# Patient Record
Sex: Male | Born: 1965
Health system: Southern US, Community
[De-identification: ages and names within clinical notes are randomized; demographics above are authoritative.]

## PROBLEM LIST (undated history)

## (undated) DIAGNOSIS — M109 Gout, unspecified: Secondary | ICD-10-CM

## (undated) DIAGNOSIS — M1990 Unspecified osteoarthritis, unspecified site: Secondary | ICD-10-CM

## (undated) DIAGNOSIS — J309 Allergic rhinitis, unspecified: Secondary | ICD-10-CM

## (undated) DIAGNOSIS — N452 Orchitis: Secondary | ICD-10-CM

## (undated) HISTORY — DX: Orchitis: N45.2

## (undated) HISTORY — PX: FRACTURE SURGERY: SHX138

## (undated) HISTORY — DX: Gout, unspecified: M10.9

## (undated) HISTORY — DX: Allergic rhinitis, unspecified: J30.9

---
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
## 1985-01-02 HISTORY — PX: NASAL SEPTUM SURGERY: SHX37

## 2014-05-19 ENCOUNTER — Ambulatory Visit: Payer: Self-pay | Admitting: Primary Care

## 2014-05-25 ENCOUNTER — Ambulatory Visit (INDEPENDENT_AMBULATORY_CARE_PROVIDER_SITE_OTHER): Payer: BLUE CROSS/BLUE SHIELD | Admitting: Primary Care

## 2014-05-25 ENCOUNTER — Ambulatory Visit: Payer: Self-pay | Admitting: Primary Care

## 2014-05-25 ENCOUNTER — Encounter: Payer: Self-pay | Admitting: Primary Care

## 2014-05-25 VITALS — BP 118/88 | HR 74 | Temp 97.9°F | Ht 70.0 in | Wt 239.4 lb

## 2014-05-25 DIAGNOSIS — M109 Gout, unspecified: Secondary | ICD-10-CM | POA: Diagnosis not present

## 2014-05-25 DIAGNOSIS — N508 Other specified disorders of male genital organs: Secondary | ICD-10-CM | POA: Diagnosis not present

## 2014-05-25 DIAGNOSIS — J309 Allergic rhinitis, unspecified: Secondary | ICD-10-CM | POA: Diagnosis not present

## 2014-05-25 DIAGNOSIS — M1A9XX0 Chronic gout, unspecified, without tophus (tophi): Secondary | ICD-10-CM | POA: Insufficient documentation

## 2014-05-25 DIAGNOSIS — N5082 Scrotal pain: Secondary | ICD-10-CM | POA: Insufficient documentation

## 2014-05-25 MED ORDER — LEVOFLOXACIN 500 MG PO TABS: 500.0000 mg | ORAL_TABLET | Freq: Every day | ORAL | Status: DC

## 2014-05-25 MED ORDER — FLUTICASONE PROPIONATE 50 MCG/ACT NA SUSP: 2.0000 | Freq: Every day | NASAL | Status: DC

## 2014-05-25 NOTE — Progress Notes (Signed)
Pre visit review using our clinic review tool, if applicable. No additional management support is needed unless otherwise documented below in the visit note. 

## 2014-05-25 NOTE — Assessment & Plan Note (Signed)
Present for 5 years. No flare up for 1.5 years. Managed on Allopurinol 300 mg and Colcrys 0.6 mg. Uric acid level today. Will consider weaning down meds per patient request if uric acid WNL.

## 2014-05-25 NOTE — Patient Instructions (Signed)
Complete lab work prior to leaving today. I will notify you of your results. Consider switching from Zyrtec to Claritin or Allegra for allergies. Start Levaquin tablets. Take 1 tablet by mouth daily for 14 days for scrotal swelling and pain.  Call me if no improvement in testicular pain/swelling in the next 3-4 days. Please schedule a physical with me in the next 3 months. You will also schedule a lab only appointment one week prior. We will discuss your lab results during your physical. It was a pleasure to meet you today! Please don't hesitate to call me with any questions. Welcome to [facility]!  Scrotal Swelling Scrotal swelling may occur on one or both sides of the scrotum. Pain may also occur with swelling. Possible causes of scrotal swelling include:   Injury.  Infection.  An ingrown hair or abrasion in the area.  Repeated rubbing from tight-fitting underwear.  Poor hygiene.  A weakened area in the muscles around the groin (hernia). A hernia can allow abdominal contents to push into the scrotum.  Fluid around the testicle (hydrocele).  Enlarged vein around the testicle (varicocele).  Certain medical treatments or existing conditions.  A recent genital surgery or procedure.  The spermatic cord becomes twisted in the scrotum, which cuts off blood supply (testicular torsion).  Testicular cancer. HOME CARE INSTRUCTIONS Once the cause of your scrotal swelling has been determined, you may be asked to monitor your scrotum for any changes. The following actions may help to alleviate any discomfort you are experiencing:  Rest and limit activity until the swelling goes away. Lying down is the preferred position.  Put ice on the scrotum:  Put ice in a plastic bag.  Place a towel between your skin and the bag.  Leave the ice on for 20 minutes, 2-3 times a day for 1-2 days.  Place a rolled towel under the testicles for support.  Wear loose-fitting clothing or an athletic  support cup for comfort.  Take all medicines as directed by your health care provider.  Perform a monthly self-exam of the scrotum and penis. Feel for changes. Ask your health care provider how to perform a monthly self-exam if you are unsure. SEEK MEDICAL CARE IF:  You have a sudden (acute) onset of pain that is persistent and not improving.  You notice a heavy feeling or fluid in the scrotum.  You have pain or burning while urinating.  You have blood in the urine or semen.  You feel a lump around the testicle.  You notice that one testicle is larger than the other (slight variation is normal).  You have a persistent dull ache or pain in the groin or scrotum. SEEK IMMEDIATE MEDICAL CARE IF:  The pain does not go away or becomes severe.  You have a fever or shaking chills.  You have pain or vomiting that cannot be controlled.  You notice significant redness or swelling of one or both sides of the scrotum.  You experience redness spreading upward from your scrotum to your abdomen or downward from your scrotum to your thighs. MAKE SURE YOU:  Understand these instructions.  Will watch your condition.  Will get help right away if you are not doing well or get worse. Document Released: 01/21/2010 Document Revised: 08/21/2012 Document Reviewed: 05/23/2012 [facility] Patient Information 2015 [place], [place]. This information is not intended to replace advice given to you by your health care provider. Make sure you discuss any questions you have with your health care provider.

## 2014-05-25 NOTE — Progress Notes (Signed)
Subjective:    Patient ID: Lonnie Castro, male    DOB: 04-26-1965, 49 y.o.   MRN: [id number]  HPI  Lonnie Castro is a 49 year old male who presents today to establish care and discuss the problems mentioned below. Will obtain old records.  1) Orchitis: 3-4 weeks ago felt uncomfortable while laying in bed/sleeping. The next several days he experienced testicular pain and noticed some swelling and elevation to the right side of his testicle. 3-4 weeks ago he presented to an Urgent Care in [place] and was determined to have a  testicular infection. He was provided with a script of Levaquin 750 mg daily for two weeks. He started feeling much better during the first several days after starting the Levaquin. He finished the 2 week course of antibiotics about 2 weeks ago. This past Friday night he started feeling the same symptoms as before with slight swelling and pain. He was told by the provider at Urgent Care that his symptoms may return.  2) Gout: Diagnosed 5 years ago and is managed on Allopurinol 300 mg once daily, and Colcrys 0.6 mg once daily. He's not had a flare up for 1.5 years. He is interested in weaning off of this medication. No recent uric acid level.  3) Allergic rhinitis: Seasonal allergies. Will get itching to left ear, sneezing, fatigue, eye itching, rhinorrhea. He takes a daily Zyrtec at bedtime and Flonase.  Review of Systems  Constitutional: Negative for fever, chills and unexpected weight change.  HENT: Negative for rhinorrhea.        Left ear itching.  Respiratory: Negative for shortness of breath.   Cardiovascular: Negative for chest pain.  Gastrointestinal: Negative for diarrhea and constipation.       Takes Metamucil daily.   Genitourinary: Positive for scrotal swelling and testicular pain. Negative for dysuria, frequency, penile swelling, difficulty urinating and penile pain.  Musculoskeletal: Negative for myalgias and arthralgias.       Chronic, mild, lower back pain.    Skin: Negative for rash.  Allergic/Immunologic: Positive for environmental allergies.  Neurological: Negative for dizziness and headaches.  Psychiatric/Behavioral:       Denies concerns for anxiety or depression.       Past Medical History  Diagnosis Date  . Gout   . Allergic rhinitis   . Orchitis     History   Social History  . Marital Status: Married    Spouse Name: N/A  . Number of Children: N/A  . Years of Education: N/A   Occupational History  . Not on file.   Social History Main Topics  . Smoking status: Never Smoker   . Smokeless tobacco: Not on file  . Alcohol Use: 2.4 oz/week    4 Standard drinks or equivalent per week  . Drug Use: Not on file  . Sexual Activity: Not on file   Other Topics Concern  . Not on file   Social History Narrative   Married.   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   Biomedical engineer.   Enjoys exercising, relaxing, flying, snowboarding.       Past Surgical History  Procedure Laterality Date  . Hernia repair    . Nasal septum surgery      History reviewed. No pertinent family history.  No Known Allergies  No current outpatient prescriptions on file prior to visit.   No current facility-administered medications on file prior to visit.    BP 118/88 mmHg  Pulse 74  Temp(Src)  97.9 F (36.6 C) (Oral)  Ht 5\' 10"  (1.778 m)  Wt 239 lb 6.4 oz (108.591 kg)  BMI 34.35 kg/m2  SpO2 94%    Objective:   Physical Exam  Constitutional: He is oriented to person, place, and time. He does not appear ill. No distress.  Cardiovascular: Normal rate and regular rhythm.   Pulmonary/Chest: Effort normal and breath sounds normal.  Abdominal: Hernia confirmed negative in the right inguinal area.  Genitourinary: Right testis shows swelling and tenderness.  Neurological: He is alert and oriented to person, place, and time.  Skin: Skin is warm and dry.  Psychiatric: He has a normal mood and affect.            Assessment & Plan:

## 2014-05-25 NOTE — Assessment & Plan Note (Signed)
Suspect epididymitis reoccurrence. Treated 4 weeks ago at [facility].  Mild swelling and tenderness to right testicle. No hernia noted. RX for Levaquin 500 mg daily for 14 days. Follow up if no improvement in 3-4 days. If reoccurance then will consider referral to urology

## 2014-05-25 NOTE — Assessment & Plan Note (Signed)
Suggested he switch from Zyrtec to Claritin or Allegra. Refilled Flonase

## 2014-05-26 ENCOUNTER — Telehealth: Payer: Self-pay | Admitting: Primary Care

## 2014-05-26 LAB — COMPREHENSIVE METABOLIC PANEL
ALT: 34 U/L (ref 0–53)
AST: 20 U/L (ref 0–37)
Albumin: 4.6 g/dL (ref 3.5–5.2)
Alkaline Phosphatase: 41 U/L (ref 39–117)
BUN: 14 mg/dL (ref 6–23)
CHLORIDE: 101 meq/L (ref 96–112)
CO2: 31 mEq/L (ref 19–32)
CREATININE: 1.09 mg/dL (ref 0.40–1.50)
Calcium: 9.5 mg/dL (ref 8.4–10.5)
GFR: 76.07 mL/min (ref >60.00)
GLUCOSE: 88 mg/dL (ref 70–99)
Potassium: 4.3 mEq/L (ref 3.5–5.1)
Sodium: 138 mEq/L (ref 135–145)
Total Bilirubin: 0.7 mg/dL (ref 0.2–1.2)
Total Protein: 6.9 g/dL (ref 6.0–8.3)

## 2014-05-26 LAB — CBC WITH DIFFERENTIAL/PLATELET
BASOS PCT: 0.6 % (ref 0.0–3.0)
Basophils Absolute: 0 10*3/uL (ref 0.0–0.1)
Eosinophils Absolute: 0.1 10*3/uL (ref 0.0–0.7)
Eosinophils Relative: 2 % (ref 0.0–5.0)
HCT: 46.5 % (ref 39.0–52.0)
HEMOGLOBIN: 16.5 g/dL (ref 13.0–17.0)
LYMPHS PCT: 26.1 % (ref 12.0–46.0)
Lymphs Abs: 1.8 10*3/uL (ref 0.7–4.0)
MCHC: 35.4 g/dL (ref 30.0–36.0)
MCV: 90.3 fl (ref 78.0–100.0)
Monocytes Absolute: 0.6 10*3/uL (ref 0.1–1.0)
Monocytes Relative: 8.3 % (ref 3.0–12.0)
NEUTROS ABS: 4.3 10*3/uL (ref 1.4–7.7)
Neutrophils Relative %: 63 % (ref 43.0–77.0)
PLATELETS: 163 10*3/uL (ref 150.0–400.0)
RBC: 5.15 Mil/uL (ref 4.22–5.81)
RDW: 12.3 % (ref 11.5–15.5)
WBC: 6.8 10*3/uL (ref 4.0–10.5)

## 2014-05-26 LAB — URIC ACID: Uric Acid, Serum: 7.2 mg/dL (ref 4.0–7.8)

## 2014-05-26 NOTE — Telephone Encounter (Signed)
Please notify Lonnie Castro that his lab work did not show acute infection in his blood stream and his electrolytes, liver, and kidney function are all normal. Continue the levaquin as discussed. His uric acid level is normal, but on the higher side of normal. He may start to decrease the colcrys tablets. Start by taking them every other day for 3 weeks and then stop taking them. He is to continue his Allopurinol. We will re-evaluate in August at his next appointment. Have him call me if he has an attack between now and his next appointment.  Thanks.

## 2014-05-27 NOTE — Telephone Encounter (Signed)
Called and notified patient of Lonnie Castro's comments. Patient verbalized understanding.  

## 2014-06-02 ENCOUNTER — Ambulatory Visit: Payer: Self-pay | Admitting: Primary Care

## 2014-06-02 ENCOUNTER — Other Ambulatory Visit: Payer: Self-pay | Admitting: Primary Care

## 2014-06-03 ENCOUNTER — Other Ambulatory Visit: Payer: Self-pay

## 2014-06-03 DIAGNOSIS — M1A9XX0 Chronic gout, unspecified, without tophus (tophi): Secondary | ICD-10-CM

## 2014-06-03 MED ORDER — ALLOPURINOL 300 MG PO TABS: 300.0000 mg | ORAL_TABLET | Freq: Every day | ORAL | Status: DC

## 2014-06-03 NOTE — Telephone Encounter (Signed)
Refill request  Allopurinol 300mg  [place]

## 2014-06-03 NOTE — Telephone Encounter (Signed)
Ok to refill. Patient was just established on 05/25/14.

## 2014-06-08 ENCOUNTER — Other Ambulatory Visit: Payer: Self-pay | Admitting: Primary Care

## 2014-06-08 ENCOUNTER — Encounter: Payer: Self-pay | Admitting: Primary Care

## 2014-06-08 DIAGNOSIS — N5089 Other specified disorders of the male genital organs: Secondary | ICD-10-CM

## 2014-06-12 ENCOUNTER — Encounter: Payer: Self-pay | Admitting: Primary Care

## 2014-07-01 ENCOUNTER — Encounter: Payer: Self-pay | Admitting: Urology

## 2014-07-01 ENCOUNTER — Ambulatory Visit (INDEPENDENT_AMBULATORY_CARE_PROVIDER_SITE_OTHER): Payer: BLUE CROSS/BLUE SHIELD | Admitting: Urology

## 2014-07-01 VITALS — BP 127/87 | HR 73 | Ht 70.0 in | Wt 238.3 lb

## 2014-07-01 DIAGNOSIS — N5082 Scrotal pain: Secondary | ICD-10-CM

## 2014-07-01 DIAGNOSIS — N508 Other specified disorders of male genital organs: Secondary | ICD-10-CM | POA: Diagnosis not present

## 2014-07-01 DIAGNOSIS — N50819 Testicular pain, unspecified: Secondary | ICD-10-CM

## 2014-07-01 LAB — URINALYSIS, COMPLETE
Bilirubin, UA: NEGATIVE
GLUCOSE, UA: NEGATIVE
KETONES UA: NEGATIVE
LEUKOCYTES UA: NEGATIVE
NITRITE UA: NEGATIVE
Protein, UA: NEGATIVE
RBC, UA: NEGATIVE
Specific Gravity, UA: 1.015 (ref 1.005–1.030)
UUROB: 0.2 mg/dL (ref 0.2–1.0)
pH, UA: 7 (ref 5.0–7.5)

## 2014-07-01 LAB — MICROSCOPIC EXAMINATION
BACTERIA UA: NONE SEEN
RBC MICROSCOPIC, UA: NONE SEEN /HPF (ref 0–?)

## 2014-07-01 NOTE — Progress Notes (Signed)
Urology Consult  Consulting MD:K. Clark,NP  CC: testicular pain  HPI: This is a 49 year old male referred by Gentry Fitz, NP for a 2-3 mo history of right testicular pain. He had sudden onset of painand mild swelling of his testicle that subjectively improved with a 2 week course of levaquin. Symptoms recurred and were again treated with abx. They improved again. No mention of fever, chills, change in urinary pattern, dysuria, discharge. He has had a piror vasectomy. Currently he is asymptomatic.  PMH: Past Medical History  Diagnosis Date  . Gout   . Allergic rhinitis   . Orchitis     PSH: Past Surgical History  Procedure Laterality Date  . Hernia repair  1984  . Nasal septum surgery  1987    Allergies: No Known Allergies  Medications:  (Not in a hospital admission)   Social History: History   Social History  . Marital Status: Married    Spouse Name: N/A  . Number of Children: N/A  . Years of Education: N/A   Occupational History  . Not on file.   Social History Main Topics  . Smoking status: Never Smoker   . Smokeless tobacco: Not on file  . Alcohol Use: 2.4 oz/week    4 Standard drinks or equivalent per week  . Drug Use: No  . Sexual Activity: Not on file   Other Topics Concern  . Not on file   Social History Narrative   Married.   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   Biomedical engineer.   Enjoys exercising, relaxing, flying, snowboarding.       Family History: Family History  Problem Relation Age of Onset  . Cancer Father     Review of Systems: Positive: Testicular pain  Negative:  A further 10 point review of systems was negative except what is listed in the HPI.  Physical Exam: Filed Vitals:   07/01/14 1432  Height: 5\' 10"  (1.778 m)  Weight: 238 lb 4.8 oz (108.092 kg)   General: No acute distress.  Awake. Head:  Normocephalic.  Atraumatic. ENT:  EOMI.  Mucous membranes moist Pulmonary: Equal effort bilaterally.  Clear  to auscultation bilaterally. Abdomen: Soft.  *Nontender to palpation. Skin:  Normal turgor.  No visible rash. Extremity: No gross deformity of bilateral upper extremities.  No gross deformity of    bilateral lower extremities. Neurologic: Alert. Appropriate mood.  Penis:  circumcised.  No lesions. Urethra:   Orthotopic meatus. Scrotum: No lesions.  No ecchymosis.  No erythema. Testicles: Descended bilaterally.  No masses bilaterally. Epididymis: Palpable bilaterally.  Non Tender to palpation.  Studies:  No results for input(s): HGB, WBC, PLT in the last 72 hours.  No results for input(s): NA, K, CL, CO2, BUN, CREATININE, CALCIUM, GFRNONAA, GFRAA in the last 72 hours.  Invalid input(s): MAGNESIUM   No results for input(s): INR, APTT in the last 72 hours.  Invalid input(s): PT   Invalid input(s): ABG  Urinalysis is clear  Assessment:  Testicular pain--not infectious in nature ( having had a vasectomy). Exam nml today, symptoms quiescent  Plan: Reassurance  NSAIDS prn recurrence  Tight briefs prn recurrence    Pager:[phone]

## 2014-08-14 ENCOUNTER — Other Ambulatory Visit: Payer: Self-pay | Admitting: Family Medicine

## 2014-08-14 ENCOUNTER — Other Ambulatory Visit: Payer: Self-pay | Admitting: Primary Care

## 2014-08-14 DIAGNOSIS — M109 Gout, unspecified: Secondary | ICD-10-CM

## 2014-08-14 DIAGNOSIS — Z0000 Encounter for general adult medical examination without abnormal findings: Secondary | ICD-10-CM

## 2014-08-20 ENCOUNTER — Other Ambulatory Visit (INDEPENDENT_AMBULATORY_CARE_PROVIDER_SITE_OTHER): Payer: BLUE CROSS/BLUE SHIELD

## 2014-08-20 DIAGNOSIS — M109 Gout, unspecified: Secondary | ICD-10-CM | POA: Diagnosis not present

## 2014-08-20 DIAGNOSIS — Z0000 Encounter for general adult medical examination without abnormal findings: Secondary | ICD-10-CM

## 2014-08-20 DIAGNOSIS — R7989 Other specified abnormal findings of blood chemistry: Secondary | ICD-10-CM | POA: Diagnosis not present

## 2014-08-20 LAB — URIC ACID: Uric Acid, Serum: 6.1 mg/dL (ref 4.0–7.8)

## 2014-08-20 LAB — LDL CHOLESTEROL, DIRECT: LDL DIRECT: 138 mg/dL

## 2014-08-20 LAB — LIPID PANEL
Cholesterol: 212 mg/dL — ABNORMAL HIGH (ref 0–200)
HDL: 34.1 mg/dL — ABNORMAL LOW
NonHDL: 177.82
Total CHOL/HDL Ratio: 6
Triglycerides: 323 mg/dL — ABNORMAL HIGH (ref 0.0–149.0)
VLDL: 64.6 mg/dL — ABNORMAL HIGH (ref 0.0–40.0)

## 2014-08-20 LAB — COMPREHENSIVE METABOLIC PANEL
ALBUMIN: 4.4 g/dL (ref 3.5–5.2)
ALT: 35 U/L (ref 0–53)
AST: 22 U/L (ref 0–37)
Alkaline Phosphatase: 50 U/L (ref 39–117)
BUN: 16 mg/dL (ref 6–23)
CHLORIDE: 104 meq/L (ref 96–112)
CO2: 31 mEq/L (ref 19–32)
Calcium: 9.6 mg/dL (ref 8.4–10.5)
Creatinine, Ser: 1.18 mg/dL (ref 0.40–1.50)
GFR: 69.35 mL/min (ref >60.00)
Glucose, Bld: 87 mg/dL (ref 70–99)
POTASSIUM: 4.6 meq/L (ref 3.5–5.1)
SODIUM: 141 meq/L (ref 135–145)
Total Bilirubin: 0.8 mg/dL (ref 0.2–1.2)
Total Protein: 6.6 g/dL (ref 6.0–8.3)

## 2014-08-20 LAB — PSA: PSA: 0.3 ng/mL (ref 0.10–4.00)

## 2014-08-20 LAB — HEMOGLOBIN A1C: Hgb A1c MFr Bld: 5.2 % (ref 4.6–6.5)

## 2014-08-25 ENCOUNTER — Encounter: Payer: BLUE CROSS/BLUE SHIELD | Admitting: Primary Care

## 2014-08-28 ENCOUNTER — Ambulatory Visit (INDEPENDENT_AMBULATORY_CARE_PROVIDER_SITE_OTHER): Payer: BLUE CROSS/BLUE SHIELD | Admitting: Primary Care

## 2014-08-28 ENCOUNTER — Encounter: Payer: Self-pay | Admitting: Primary Care

## 2014-08-28 VITALS — BP 112/76 | HR 66 | Temp 98.3°F | Ht 70.0 in | Wt 240.8 lb

## 2014-08-28 DIAGNOSIS — Z23 Encounter for immunization: Secondary | ICD-10-CM | POA: Diagnosis not present

## 2014-08-28 DIAGNOSIS — Z0000 Encounter for general adult medical examination without abnormal findings: Secondary | ICD-10-CM | POA: Insufficient documentation

## 2014-08-28 DIAGNOSIS — H60549 Acute eczematoid otitis externa, unspecified ear: Secondary | ICD-10-CM | POA: Insufficient documentation

## 2014-08-28 DIAGNOSIS — H60543 Acute eczematoid otitis externa, bilateral: Secondary | ICD-10-CM | POA: Diagnosis not present

## 2014-08-28 DIAGNOSIS — N5082 Scrotal pain: Secondary | ICD-10-CM

## 2014-08-28 DIAGNOSIS — N508 Other specified disorders of male genital organs: Secondary | ICD-10-CM

## 2014-08-28 DIAGNOSIS — M1A9XX0 Chronic gout, unspecified, without tophus (tophi): Secondary | ICD-10-CM | POA: Diagnosis not present

## 2014-08-28 DIAGNOSIS — E785 Hyperlipidemia, unspecified: Secondary | ICD-10-CM | POA: Insufficient documentation

## 2014-08-28 MED ORDER — DESONIDE 0.05 % EX OINT: 1.0000 "application " | TOPICAL_OINTMENT | Freq: Two times a day (BID) | CUTANEOUS | Status: DC

## 2014-08-28 MED ORDER — ALLOPURINOL 100 MG PO TABS: 200.0000 mg | ORAL_TABLET | Freq: Every day | ORAL | Status: DC

## 2014-08-28 NOTE — Assessment & Plan Note (Signed)
TC, LDL, Trigs above goal. Start Fish Oil OTC, discussed importance of weight loss through healthy diet and exercise. Will recheck in 6 months. Start daily aspirin 81 mg.

## 2014-08-28 NOTE — Addendum Note (Signed)
Addended by: Jacqualin Combes on: 08/28/2014 02:58 PM   Modules accepted: Orders

## 2014-08-28 NOTE — Assessment & Plan Note (Signed)
Symptoms of joint aches returned as he suddenly took himself off of both allopurinol and colchicine. He resumed his medication and has been taking for 2 weeks. Will restart Allopurinol at 200 mg. Continue colchicine until achy pain resolves. Uric acid level 6.1 on 8/18

## 2014-08-28 NOTE — Progress Notes (Signed)
Pre visit review using our clinic review tool, if applicable. No additional management support is needed unless otherwise documented below in the visit note. 

## 2014-08-28 NOTE — Assessment & Plan Note (Signed)
Present for months. Some improvement with OTC hydrocortisone. RX sent for desonide 0.05 BID. Will continue to monitor.

## 2014-08-28 NOTE — Patient Instructions (Addendum)
You have rectus abdominis diastasis. The best treatment for this is weight loss.  Start taking 2000 mg of fish oil daily for triglycerides.  Start daily aspirin 81 mg.  It is important that you improve your diet. Please limit carbohydrates in the form of fried foods, fast foods, red meat. Increase your consumption of fresh fruits and vegetables. Be sure to drink plenty of water daily.  Start Desonide 0.05 ointment. Apply twice daily as needed for itching.  Refills have been sent for your Allopurinol. Take 2 tablets by mouth daily.  Follow up in 6 months for re-evaluation of cholesterol.  Schedule a 30 minute appointment for skin tag removal at your convenience.   It was a pleasure to see you today!

## 2014-08-28 NOTE — Progress Notes (Signed)
Subjective:    Patient ID: Lonnie Castro, male    DOB: April 14, 1965, 49 y.o.   MRN: [id number]  HPI  Lonnie Castro is a 49 year old male who presents today for complete physical.  Immunizations: -Tetanus: Unsure, will administer today. -Influenza: Did not receive last season. Declines today.    Diet: Endorses healthy diet. Breakfast: Oatmeal, egg whites, or [place] bacon, wheat toast, cereal Lunch: Ham sandwich, meat and vegetables Dinner: Grilled chicken, fish. Eats out at restaurants ([place]), some vegetables. Snack: Ice cream, chocolate milk Beverages: Drinks mostly coffee, water, chocolate milk. Limited sweet tea. Exercise: Run 2 miles twice weekly, attends the gym for 30-45 minutes for 2 days a week. Plays tennis several times monthly. Eye exam: Completed in the last 6 months ago Dental exam: Completed in the last 6 months Colonoscopy: Has never completed.  1) Scrotal swelling: Evaluated at [facility] for scrotal swelling and was told his symptoms were nothing to be concerned about and to wear tightter shorts and take ibuprofen. Prostate exam completed during that visit and was unremarkable per patient.  2) Gout: Managed on colchicine 0.6 mg and Allopurinol 300 mg daily. He has not had a flare up in the past 1.5 years. He took himself off of both medications 4 weeks ago and noticed achiness to his joints. He restarted his medications one week ago and is feeling an improvement.   Review of Systems  Constitutional: Negative for unexpected weight change.  HENT:       Itching to external canals, improvement with OTC hydrocortisone cream  Respiratory: Negative for shortness of breath.   Cardiovascular: Negative for chest pain.  Gastrointestinal: Negative for diarrhea and constipation.  Genitourinary: Negative for scrotal swelling and difficulty urinating.  Musculoskeletal: Negative for myalgias and arthralgias.  Skin: Negative for rash.  Neurological: Negative for dizziness,  numbness and headaches.  Psychiatric/Behavioral:       Denies concerns for anxiety or depression       Past Medical History  Diagnosis Date  . Gout   . Allergic rhinitis   . Orchitis     Social History   Social History  . Marital Status: Married    Spouse Name: N/A  . Number of Children: N/A  . Years of Education: N/A   Occupational History  . Not on file.   Social History Main Topics  . Smoking status: Never Smoker   . Smokeless tobacco: Not on file  . Alcohol Use: 2.4 oz/week    4 Standard drinks or equivalent per week  . Drug Use: No  . Sexual Activity: Not on file   Other Topics Concern  . Not on file   Social History Narrative   Married.   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   Biomedical engineer.   Enjoys exercising, relaxing, flying, snowboarding.       Past Surgical History  Procedure Laterality Date  . Hernia repair  1984  . Nasal septum surgery  1987    Family History  Problem Relation Age of Onset  . Cancer Father     No Known Allergies  Current Outpatient Prescriptions on File Prior to Visit  Medication Sig Dispense Refill  . colchicine 0.6 MG tablet Take 0.6 mg by mouth daily.    . fluticasone (FLONASE) 50 MCG/ACT nasal spray Place 2 sprays into both nostrils daily. 16 g 11   No current facility-administered medications on file prior to visit.    BP 112/76 mmHg  Pulse 66  Temp(Src) 98.3 F (36.8 C) (Oral)  Ht 5\' 10"  (1.778 m)  Wt 240 lb 12.8 oz (109.226 kg)  BMI 34.55 kg/m2  SpO2 94%     Objective:   Physical Exam  Constitutional: He is oriented to person, place, and time. He appears well-nourished.  HENT:  Right Ear: Tympanic membrane and ear canal normal.  Left Ear: Tympanic membrane and ear canal normal.  Nose: Nose normal.  Mouth/Throat: Oropharynx is clear and moist.  Eyes: Conjunctivae and EOM are normal. Pupils are equal, round, and reactive to light.  Neck: Neck supple.  Cardiovascular: Normal  rate and regular rhythm.   Pulmonary/Chest: Effort normal and breath sounds normal.  Abdominal: Soft. Bowel sounds are normal. There is no tenderness.  Abdominal mass representing rectus abdominis diastasis. No pain.  Musculoskeletal:       Right knee: He exhibits swelling. He exhibits no deformity.  Mild swelling to right knee with some crepitus. No pain with ROM. Slight decrease in ROM.  Lymphadenopathy:    He has no cervical adenopathy.  Neurological: He is alert and oriented to person, place, and time. He has normal reflexes. No cranial nerve deficit.  Skin: Skin is warm and dry.  Psychiatric: He has a normal mood and affect.          Assessment & Plan:

## 2014-08-28 NOTE — Assessment & Plan Note (Signed)
Seen by urology and advised not to worry. No pain since, no swelling. He is to wear tight underwear. Prostate exam completed that day and was unremarkable.

## 2014-08-28 NOTE — Assessment & Plan Note (Signed)
Tetanus provided today. Declines flu. Exam mostly unremarkable. Referral made for colonoscopy. Cholesterol and triglycerides elevated. Will treat. Discussed the importance of weight loss through diet and exercise.

## 2014-09-02 ENCOUNTER — Telehealth: Payer: Self-pay | Admitting: Primary Care

## 2014-09-02 NOTE — Telephone Encounter (Signed)
Pt wanted me to let you know he loved the [place] you suggested and you should recommend it to all of your chocolate milk loving patients.

## 2014-09-04 ENCOUNTER — Ambulatory Visit (INDEPENDENT_AMBULATORY_CARE_PROVIDER_SITE_OTHER): Payer: BLUE CROSS/BLUE SHIELD | Admitting: Primary Care

## 2014-09-04 ENCOUNTER — Encounter: Payer: Self-pay | Admitting: Primary Care

## 2014-09-04 VITALS — BP 114/74 | HR 83 | Temp 98.1°F | Ht 70.0 in | Wt 238.4 lb

## 2014-09-04 DIAGNOSIS — Q828 Other specified congenital malformations of skin: Secondary | ICD-10-CM | POA: Diagnosis not present

## 2014-09-04 NOTE — Patient Instructions (Signed)
Apply bacitracin to the area for the next 2 days. Apply band aids to underarms for protection.  You may bleed a little today. Do not be concerned.   It was a pleasure to see you today!

## 2014-09-04 NOTE — Progress Notes (Signed)
Pre visit review using our clinic review tool, if applicable. No additional management support is needed unless otherwise documented below in the visit note. 

## 2014-09-04 NOTE — Progress Notes (Signed)
   Subjective:    Patient ID: Lonnie Castro, male    DOB: 24-Jun-1965, 49 y.o.   MRN: [id number]  HPI  Lonnie Castro is a 49 year old male who presents today for skin tag removal. He is requesting 4 skin tags be removed: anterior and posterior neck, and 2 to left axilla. See procedure noted below.  Review of Systems  Skin: Positive for color change, rash and wound.       Past Medical History  Diagnosis Date  . Gout   . Allergic rhinitis   . Orchitis     Social History   Social History  . Marital Status: Married    Spouse Name: N/A  . Number of Children: N/A  . Years of Education: N/A   Occupational History  . Not on file.   Social History Main Topics  . Smoking status: Never Smoker   . Smokeless tobacco: Not on file  . Alcohol Use: 2.4 oz/week    4 Standard drinks or equivalent per week  . Drug Use: No  . Sexual Activity: Not on file   Other Topics Concern  . Not on file   Social History Narrative   Married.   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   Biomedical engineer.   Enjoys exercising, relaxing, flying, snowboarding.       Past Surgical History  Procedure Laterality Date  . Hernia repair  1984  . Nasal septum surgery  1987    Family History  Problem Relation Age of Onset  . Cancer Father     No Known Allergies  Current Outpatient Prescriptions on File Prior to Visit  Medication Sig Dispense Refill  . allopurinol (ZYLOPRIM) 100 MG tablet Take 2 tablets (200 mg total) by mouth daily. 60 tablet 5  . colchicine 0.6 MG tablet Take 0.6 mg by mouth daily.    Marland Kitchen desonide (DESOWEN) 0.05 % ointment Apply 1 application topically 2 (two) times daily. 15 g 0  . fluticasone (FLONASE) 50 MCG/ACT nasal spray Place 2 sprays into both nostrils daily. 16 g 11   No current facility-administered medications on file prior to visit.    BP 114/74 mmHg  Pulse 83  Temp(Src) 98.1 F (36.7 C) (Oral)  Ht 5\' 10"  (1.778 m)  Wt 238 lb 6.4 oz (108.138 kg)  BMI  34.21 kg/m2  SpO2 98%    Objective:   Physical Exam  Constitutional: He appears well-nourished.  Skin: Skin is warm and dry. No rash noted. No erythema.  4 skin tags present          Assessment & Plan:  Consent signed by patient and myself. Witnessed by Vallarie Mare, CMA. 4 skin tags total. One to posterior neck (base), one to anterior neck just lateral to base, 2 under left axilla. Hands washed and gloves applied. Areas were cleaned with iodine. Pain Ease spray applied to each site with time to take effect. With an 11 blade and forceps each skin tag was removed, Silver nitrite applied to each site. No bleeding once procedure completed. Band aids provided for protection. Bacitracin provided to patient with instructions on how to care for sites. Discussed s/s of infection and to notify me immediately.

## 2014-11-03 ENCOUNTER — Encounter
Admission: RE | Disposition: A | Discharge status: Home / Self Care | Payer: Self-pay | Source: Ambulatory Visit | Attending: Gastroenterology

## 2014-11-03 ENCOUNTER — Ambulatory Visit
Admission: RE | Admit: 2014-11-03 | Discharge: 2014-11-03 | Disposition: A | Discharge status: Home / Self Care | Payer: BLUE CROSS/BLUE SHIELD | Source: Ambulatory Visit | Attending: Gastroenterology | Admitting: Gastroenterology

## 2014-11-03 ENCOUNTER — Ambulatory Visit: Payer: BLUE CROSS/BLUE SHIELD | Admitting: Anesthesiology

## 2014-11-03 ENCOUNTER — Encounter: Payer: Self-pay | Admitting: *Deleted

## 2014-11-03 DIAGNOSIS — Z79899 Other long term (current) drug therapy: Secondary | ICD-10-CM | POA: Diagnosis not present

## 2014-11-03 DIAGNOSIS — D124 Benign neoplasm of descending colon: Secondary | ICD-10-CM | POA: Diagnosis not present

## 2014-11-03 DIAGNOSIS — D120 Benign neoplasm of cecum: Secondary | ICD-10-CM | POA: Insufficient documentation

## 2014-11-03 DIAGNOSIS — F17210 Nicotine dependence, cigarettes, uncomplicated: Secondary | ICD-10-CM | POA: Insufficient documentation

## 2014-11-03 DIAGNOSIS — J309 Allergic rhinitis, unspecified: Secondary | ICD-10-CM | POA: Insufficient documentation

## 2014-11-03 DIAGNOSIS — M109 Gout, unspecified: Secondary | ICD-10-CM | POA: Diagnosis not present

## 2014-11-03 DIAGNOSIS — M1990 Unspecified osteoarthritis, unspecified site: Secondary | ICD-10-CM | POA: Diagnosis not present

## 2014-11-03 DIAGNOSIS — D125 Benign neoplasm of sigmoid colon: Secondary | ICD-10-CM | POA: Diagnosis not present

## 2014-11-03 DIAGNOSIS — Z7951 Long term (current) use of inhaled steroids: Secondary | ICD-10-CM | POA: Diagnosis not present

## 2014-11-03 DIAGNOSIS — Z1211 Encounter for screening for malignant neoplasm of colon: Secondary | ICD-10-CM | POA: Insufficient documentation

## 2014-11-03 DIAGNOSIS — D127 Benign neoplasm of rectosigmoid junction: Secondary | ICD-10-CM | POA: Insufficient documentation

## 2014-11-03 HISTORY — PX: COLONOSCOPY WITH PROPOFOL: SHX5780

## 2014-11-03 HISTORY — DX: Unspecified osteoarthritis, unspecified site: M19.90

## 2014-11-03 LAB — SURGICAL PATHOLOGY

## 2014-11-03 SURGERY — COLONOSCOPY WITH PROPOFOL
Anesthesia: General

## 2014-11-03 MED ORDER — PROPOFOL 500 MG/50ML IV EMUL
INTRAVENOUS | Status: DC | PRN
  Administered 2014-11-03: 140 ug/kg/min via INTRAVENOUS

## 2014-11-03 MED ORDER — SODIUM CHLORIDE 0.9 % IV SOLN
INTRAVENOUS | Status: DC
Start: 2014-11-03 — End: 2014-11-03
  Administered 2014-11-03: 10:00:00 via INTRAVENOUS

## 2014-11-03 MED ORDER — EPHEDRINE SULFATE 50 MG/ML IJ SOLN
INTRAMUSCULAR | Status: DC | PRN
  Administered 2014-11-03: 10 mg via INTRAVENOUS

## 2014-11-03 MED ORDER — MIDAZOLAM HCL 2 MG/2ML IJ SOLN
INTRAMUSCULAR | Status: DC | PRN
  Administered 2014-11-03: 1 mg via INTRAVENOUS

## 2014-11-03 MED ORDER — EPHEDRINE SULFATE 50 MG/ML IJ SOLN: INTRAMUSCULAR | Status: DC | PRN

## 2014-11-03 MED ORDER — PHENYLEPHRINE HCL 10 MG/ML IJ SOLN
INTRAMUSCULAR | Status: DC | PRN
  Administered 2014-11-03: 50 ug via INTRAVENOUS

## 2014-11-03 MED ORDER — FENTANYL CITRATE (PF) 100 MCG/2ML IJ SOLN
INTRAMUSCULAR | Status: DC | PRN
  Administered 2014-11-03: 50 ug via INTRAVENOUS

## 2014-11-03 NOTE — Anesthesia Procedure Notes (Signed)
Performed by: COOK-MARTIN, Deena Shaub Pre-anesthesia Checklist: Patient identified, Emergency Drugs available, Suction available, Patient being monitored and Timeout performed Patient Re-evaluated:Patient Re-evaluated prior to inductionOxygen Delivery Method: Nasal cannula Preoxygenation: Pre-oxygenation with 100% oxygen Intubation Type: IV induction Airway Equipment and Method: Bite block Placement Confirmation: positive ETCO2 and CO2 detector     

## 2014-11-03 NOTE — H&P (Signed)
Outpatient short stay form Pre-procedure 11/03/2014 9:35 AM Lollie Sails MD  Primary Physician: Alma Friendly NP  Reason for visit:  Colonoscopy  History of present illness:  Patient is a 49 year old male presenting today for a screening colonoscopy. He has had no previous colonoscopy. There is no family history of colon polyps or colon cancer. He takes no anticoagulant medications are aspirin medications. He tolerated his prep well.    Current facility-administered medications:  .  0.9 %  sodium chloride infusion, , Intravenous, Continuous, Lollie Sails, MD  Prescriptions prior to admission  Medication Sig Dispense Refill Last Dose  . allopurinol (ZYLOPRIM) 100 MG tablet Take 2 tablets (200 mg total) by mouth daily. 60 tablet 5 Taking  . colchicine 0.6 MG tablet Take 0.6 mg by mouth daily.   Taking  . desonide (DESOWEN) 0.05 % ointment Apply 1 application topically 2 (two) times daily. 15 g 0 Taking  . fluticasone (FLONASE) 50 MCG/ACT nasal spray Place 2 sprays into both nostrils daily. 16 g 11 Taking     No Known Allergies   Past Medical History  Diagnosis Date  . Gout   . Allergic rhinitis   . Orchitis   . Arthritis     Review of systems:      Physical Exam    Heart and lungs: Regular rate and rhythm without rub or gallop, lungs are bilaterally clear    HEENT: Normocephalic atraumatic eyes are anicteric    Other:     Pertinant exam for procedure: Soft nontender nondistended bowel sounds positive normoactive    Planned proceedures: Colonoscopy and indicated procedures I have discussed the risks benefits and complications of procedures to include not limited to bleeding, infection, perforation and the risk of sedation and the patient wishes to proceed.    Lollie Sails, MD Gastroenterology 11/03/2014  9:35 AM

## 2014-11-03 NOTE — Anesthesia Preprocedure Evaluation (Signed)
Anesthesia Evaluation  Patient identified by MRN, date of birth, ID band Patient awake    Reviewed: Allergy & Precautions, H&P , NPO status , Patient's Chart, lab work & pertinent test results  Airway Mallampati: II  TM Distance: >3 FB Neck ROM: full    Dental no notable dental hx. (+) Teeth Intact   Pulmonary neg shortness of breath, Current Smoker,    Pulmonary exam normal breath sounds clear to auscultation       Cardiovascular Exercise Tolerance: Good (-) angina(-) Past MI and (-) DOE negative cardio ROS Normal cardiovascular exam Rhythm:regular Rate:Normal     Neuro/Psych negative neurological ROS  negative psych ROS   GI/Hepatic negative GI ROS, Neg liver ROS, neg GERD  ,  Endo/Other  negative endocrine ROS  Renal/GU negative Renal ROS  negative genitourinary   Musculoskeletal  (+) Arthritis ,   Abdominal   Peds  Hematology negative hematology ROS (+)   Anesthesia Other Findings Past Medical History:   Gout                                                         Allergic rhinitis                                            Orchitis                                                     Arthritis                                                   Past Surgical History:   HERNIA REPAIR                                    1984         NASAL SEPTUM SURGERY                             1987        BMI    Body Mass Index   33.28 kg/m 2    Signs and symptoms suggestive of sleep apnea    Reproductive/Obstetrics negative OB ROS                             Anesthesia Physical Anesthesia Plan  ASA: III  Anesthesia Plan: General   Post-op Pain Management:    Induction:   Airway Management Planned:   Additional Equipment:   Intra-op Plan:   Post-operative Plan:   Informed Consent: I have reviewed the patients History and Physical, chart, labs and discussed the procedure  including the risks, benefits and alternatives for the proposed anesthesia with the patient or authorized representative who has indicated his/her understanding  and acceptance.   Dental Advisory Given  Plan Discussed with: Anesthesiologist, CRNA and Surgeon  Anesthesia Plan Comments:         Anesthesia Quick Evaluation

## 2014-11-03 NOTE — Op Note (Addendum)
[facility] Gastroenterology Patient Name: Lonnie Castro Procedure Date: 11/03/2014 10:21 AM MRN: [id number] Account #: [id number] Date of Birth: 1965/05/21 Admit Type: Outpatient Age: 49 Room: [facility] ENDO ROOM 3 Gender: Male Note Status: Supervisor Override Procedure:         Colonoscopy Indications:       This is the patient's first colonoscopy Providers:         Lollie Sails, MD Referring MD:      Felipa Evener. Carlis Abbott (Referring MD), [name]                     (Referring MD) Medicines:         Monitored Anesthesia Care Complications:     No immediate complications. Procedure:         Pre-Anesthesia Assessment:                    - ASA Grade Assessment: II - A patient with mild systemic                     disease.                    After obtaining informed consent, the colonoscope was                     passed under direct vision. Throughout the procedure, the                     patient's blood pressure, pulse, and oxygen saturations                     were monitored continuously. The Colonoscope was                     introduced through the anus and advanced to the the cecum,                     identified by appendiceal orifice and ileocecal valve. The                     colonoscopy was performed without difficulty. The patient                     tolerated the procedure well. The quality of the bowel                     preparation was good. Findings:      A 3 mm polyp was found in the proximal sigmoid colon. The polyp was       flat. The polyp was removed with a cold biopsy forceps. Resection and       retrieval were complete.      A 5 mm polyp was found in the proximal sigmoid colon. The polyp was       flat. The polyp was removed with a cold snare. Resection and retrieval       were complete.      A 2 mm polyp was found in the cecum. The polyp was sessile. The polyp       was removed with a cold biopsy forceps. Resection and retrieval  were       complete.      A 3 mm polyp was found in the descending colon. The polyp was flat. The  polyp was removed with a cold biopsy forceps. Resection and retrieval       were complete.      A 3 mm polyp was found in the descending colon in the distal descending       colon. The polyp was flat. The polyp was removed with a cold biopsy       forceps. Resection and retrieval were complete.      A 3 mm polyp was found in the recto-sigmoid colon. The polyp was       sessile. The polyp was removed with a cold biopsy forceps. Resection and       retrieval were complete.      The exam was otherwise without abnormality.      The retroflexed view of the distal rectum and anal verge was normal and       showed no anal or rectal abnormalities.      The digital rectal exam was normal. Impression:        - One 3 mm polyp in the proximal sigmoid colon. Resected                     and retrieved.                    - One 5 mm polyp in the proximal sigmoid colon. Resected                     and retrieved.                    - One 2 mm polyp in the cecum. Resected and retrieved.                    - One 3 mm polyp in the descending colon. Resected and                     retrieved.                    - One 3 mm polyp in the descending colon in the distal                     descending colon. Resected and retrieved.                    - One 3 mm polyp at the recto-sigmoid colon. Resected and                     retrieved.                    - The examination was otherwise normal.                    - The distal rectum and anal verge are normal on                     retroflexion view. Recommendation:    - Await pathology results.                    - Telephone GI clinic for pathology results in 1 week. Procedure Code(s): --- Professional ---                    [phone], Colonoscopy, flexible; with removal of tumor(s),  polyp(s), or other lesion(s) by snare technique                     45380, 59, Colonoscopy, flexible; with biopsy, single or                     multiple Diagnosis Code(s): --- Professional ---                    211.3, Benign neoplasm of colon                    211.4, Benign neoplasm of rectum and anal canal CPT copyright 2014 American Medical Association. All rights reserved. The codes documented in this report are preliminary and upon coder review may  be revised to meet current compliance requirements. Lollie Sails, MD 11/03/2014 10:27:18 AM This report has been signed electronically. Number of Addenda: 0 Note Initiated On: 11/03/2014 10:21 AM      [facility]

## 2014-11-03 NOTE — Anesthesia Postprocedure Evaluation (Signed)
  Anesthesia Post-op Note  Patient: Lonnie Castro  Procedure(s) Performed: Procedure(s): COLONOSCOPY WITH PROPOFOL (N/A)  Anesthesia type:General  Patient location: PACU  Post pain: Pain level controlled  Post assessment: Post-op Vital signs reviewed, Patient's Cardiovascular Status Stable, Respiratory Function Stable, Patent Airway and No signs of Nausea or vomiting  Post vital signs: Reviewed and stable  Last Vitals:  Filed Vitals:   11/03/14 1110  BP: 112/74  Pulse: 63  Temp:   Resp: 18    Level of consciousness: awake, alert  and patient cooperative  Complications: No apparent anesthesia complications

## 2014-11-03 NOTE — Transfer of Care (Signed)
Immediate Anesthesia Transfer of Care Note  Patient: Lonnie Castro  Procedure(s) Performed: Procedure(s): COLONOSCOPY WITH PROPOFOL (N/A)  Patient Location: PACU  Anesthesia Type:General  Level of Consciousness: awake, alert , oriented and sedated  Airway & Oxygen Therapy: Patient Spontanous Breathing and Patient connected to face mask oxygen  Post-op Assessment: Report given to RN and Post -op Vital signs reviewed and stable  Post vital signs: Reviewed and stable  Last Vitals:  Filed Vitals:   11/03/14 1027  BP:   Pulse:   Temp: 36.2 C  Resp:     Complications: No apparent anesthesia complications

## 2014-11-04 ENCOUNTER — Encounter: Payer: Self-pay | Admitting: Gastroenterology

## 2014-12-22 ENCOUNTER — Encounter: Payer: Self-pay | Admitting: Primary Care

## 2014-12-22 ENCOUNTER — Telehealth: Payer: Self-pay | Admitting: Primary Care

## 2014-12-22 NOTE — Telephone Encounter (Signed)
[facility]     Patient Name: Lonnie Castro Initial Comment Caller states he injured his ankle while skiing, is out of town, wants care advice  DOB: 1965/04/26      Nurse Assessment  Nurse: Luther Parody, RN, Malachy Mood Date/Time (Eastern Time): 12/22/2014 11:53:28 AM  Confirm and document reason for call. If symptomatic, describe symptoms. ---Caller states that he was out of town skiing and fell twisting his ankle yesterday. States that the ankle is swollen and bruised. He is ambulatory but with difficulty.  Has the patient traveled out of the country within the last 30 days? ---Not Applicable  Does the patient have any new or worsening symptoms? ---Yes  Will a triage be completed? ---Yes  Related visit to physician within the last 2 weeks? ---No  Does the PT have any chronic conditions? (i.e. diabetes, asthma, etc.) ---No  Is this a behavioral health or substance abuse call? ---No    Guidelines     Guideline Title Affirmed Question Affirmed Notes   Foot and Ankle Injury [1] Limp when walking AND [2] due to a twisted ankle or foot    Final Disposition User   See Physician within 24 Hours Luther Parody, RN, [employer]     Referrals   REFERRED TO PCP OFFICE   Disagree/Comply: Leta Baptist

## 2014-12-22 NOTE — Telephone Encounter (Signed)
Pt has appt 12/23/14 at 10:45 with [name] NP.

## 2014-12-23 ENCOUNTER — Ambulatory Visit (INDEPENDENT_AMBULATORY_CARE_PROVIDER_SITE_OTHER)
Admission: RE | Admit: 2014-12-23 | Discharge: 2014-12-23 | Disposition: A | Discharge status: Home / Self Care | Payer: BLUE CROSS/BLUE SHIELD | Source: Ambulatory Visit | Attending: Primary Care | Admitting: Primary Care

## 2014-12-23 ENCOUNTER — Ambulatory Visit (INDEPENDENT_AMBULATORY_CARE_PROVIDER_SITE_OTHER): Payer: BLUE CROSS/BLUE SHIELD | Admitting: Primary Care

## 2014-12-23 ENCOUNTER — Telehealth: Payer: Self-pay | Admitting: Primary Care

## 2014-12-23 ENCOUNTER — Other Ambulatory Visit: Payer: Self-pay | Admitting: Primary Care

## 2014-12-23 ENCOUNTER — Encounter: Payer: Self-pay | Admitting: Primary Care

## 2014-12-23 VITALS — BP 124/84 | HR 80 | Temp 98.3°F | Ht 70.0 in | Wt 241.8 lb

## 2014-12-23 DIAGNOSIS — M25571 Pain in right ankle and joints of right foot: Secondary | ICD-10-CM

## 2014-12-23 DIAGNOSIS — S82891A Other fracture of right lower leg, initial encounter for closed fracture: Secondary | ICD-10-CM

## 2014-12-23 NOTE — Progress Notes (Signed)
Subjective:    Patient ID: Lonnie Castro, male    DOB: [birth year], 49 y.o.   MRN: [id number]  HPI  Mr. Lonnie Castro is a 49 year old male who presents today with a chief complaint of ankle pain. His pain is located to the right ankle and has been present since 12/21/14 after he fell when snowboarding. He heard a cracking sound immediately after falling. He didn't notice swelling until hours later. He's been walking on his ankle unsupported for the past 24 hours. He's been taking 600 mg of ibuprofen three times daily, but has not been wrapping or icing his ankle as recommended. He is ambulatory with difficulty walking due to pain.  Review of Systems  Constitutional: Negative for fever.  Musculoskeletal:       Right ankle pain and swelling  Skin: Positive for color change.       Bruising to right ankle and foot       Past Medical History  Diagnosis Date  . Gout   . Allergic rhinitis   . Orchitis   . Arthritis     Social History   Social History  . Marital Status: Married    Spouse Name: N/A  . Number of Children: N/A  . Years of Education: N/A   Occupational History  . Not on file.   Social History Main Topics  . Smoking status: Never Smoker   . Smokeless tobacco: Never Used  . Alcohol Use: 2.4 oz/week    4 Standard drinks or equivalent per week  . Drug Use: No  . Sexual Activity: Not on file   Other Topics Concern  . Not on file   Social History Narrative   Married.   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   Biomedical engineer.   Enjoys exercising, relaxing, flying, snowboarding.       Past Surgical History  Procedure Laterality Date  . Hernia repair  1984  . Nasal septum surgery  1987  . Colonoscopy with propofol N/A 11/03/2014    Procedure: COLONOSCOPY WITH PROPOFOL;  Surgeon: Lollie Sails, MD;  Location: [facility] ENDOSCOPY;  Service: Endoscopy;  Laterality: N/A;    Family History  Problem Relation Age of Onset  . Cancer Father     No Known  Allergies  Current Outpatient Prescriptions on File Prior to Visit  Medication Sig Dispense Refill  . allopurinol (ZYLOPRIM) 100 MG tablet Take 2 tablets (200 mg total) by mouth daily. 60 tablet 5  . colchicine 0.6 MG tablet Take 0.6 mg by mouth daily.    Marland Kitchen desonide (DESOWEN) 0.05 % ointment Apply 1 application topically 2 (two) times daily. 15 g 0  . fluticasone (FLONASE) 50 MCG/ACT nasal spray Place 2 sprays into both nostrils daily. 16 g 11   No current facility-administered medications on file prior to visit.    BP 124/84 mmHg  Pulse 80  Temp(Src) 98.3 F (36.8 C) (Oral)  Ht 5\' 10"  (1.778 m)  Wt 241 lb 12.8 oz (109.68 kg)  BMI 34.69 kg/m2  SpO2 95%    Objective:   Physical Exam  Constitutional: He appears well-nourished.  Cardiovascular: Normal rate and regular rhythm.   Pulmonary/Chest: Effort normal and breath sounds normal.  Musculoskeletal:       Right ankle: He exhibits decreased range of motion, swelling and ecchymosis. He exhibits no deformity and normal pulse. Tenderness.  Moderate swelling and pain upon PROM to right ankle.  Skin: Skin is warm and dry.  Swelling to  ankle. Bruising to right lateral and dorsal aspect of foot.          Assessment & Plan:  Ankle pain:  Located to right ankle with bruising and swelling since falling off snowboard 2 days ago. Limited ROM during exam. Moderate bruising and swelling. Good pedal pulses. Due to recent trauma will obtain xray to rule out fracture. Suspect bad sprain and instructions provided for supportive treatment.  Discussed RICE and provided instructions on how to wrap ankle. Discussed it may take several weeks to completely heal and recommended he rest his ankle today. Follow up PRN. Xray pending.

## 2014-12-23 NOTE — Telephone Encounter (Signed)
Patient was notified regarding his xray results. Urgent referral placed to orthopedics.

## 2014-12-23 NOTE — Progress Notes (Signed)
Pre visit review using our clinic review tool, if applicable. No additional management support is needed unless otherwise documented below in the visit note. 

## 2014-12-23 NOTE — Patient Instructions (Signed)
Complete xray(s) prior to leaving today. I will notify you of your results once received.  Continue ibuprofen 600 mg three times daily as needed for pain and inflammation.  Rest your ankle. Elevate. Apply ice for 20 minutes at a time at least 3 times daily.  I'll be in touch with you later today.  Ankle Sprain An ankle sprain is an injury to the strong, fibrous tissues (ligaments) that hold the bones of your ankle joint together.  CAUSES An ankle sprain is usually caused by a fall or by twisting your ankle. Ankle sprains most commonly occur when you step on the outer edge of your foot, and your ankle turns inward. People who participate in sports are more prone to these types of injuries.  SYMPTOMS   Pain in your ankle. The pain may be present at rest or only when you are trying to stand or walk.  Swelling.  Bruising. Bruising may develop immediately or within 1 to 2 days after your injury.  Difficulty standing or walking, particularly when turning corners or changing directions. DIAGNOSIS  Your caregiver will ask you details about your injury and perform a physical exam of your ankle to determine if you have an ankle sprain. During the physical exam, your caregiver will press on and apply pressure to specific areas of your foot and ankle. Your caregiver will try to move your ankle in certain ways. An X-ray exam may be done to be sure a bone was not broken or a ligament did not separate from one of the bones in your ankle (avulsion fracture).  TREATMENT  Certain types of braces can help stabilize your ankle. Your caregiver can make a recommendation for this. Your caregiver may recommend the use of medicine for pain. If your sprain is severe, your caregiver may refer you to a surgeon who helps to restore function to parts of your skeletal system (orthopedist) or a physical therapist. Mason ice to your injury for 1-2 days or as directed by your caregiver. Applying  ice helps to reduce inflammation and pain.  Put ice in a plastic bag.  Place a towel between your skin and the bag.  Leave the ice on for 15-20 minutes at a time, every 2 hours while you are awake.  Only take over-the-counter or prescription medicines for pain, discomfort, or fever as directed by your caregiver.  Elevate your injured ankle above the level of your heart as much as possible for 2-3 days.  If your caregiver recommends crutches, use them as instructed. Gradually put weight on the affected ankle. Continue to use crutches or a cane until you can walk without feeling pain in your ankle.  If you have a plaster splint, wear the splint as directed by your caregiver. Do not rest it on anything harder than a pillow for the first 24 hours. Do not put weight on it. Do not get it wet. You may take it off to take a shower or bath.  You may have been given an elastic bandage to wear around your ankle to provide support. If the elastic bandage is too tight (you have numbness or tingling in your foot or your foot becomes cold and blue), adjust the bandage to make it comfortable.  If you have an air splint, you may blow more air into it or let air out to make it more comfortable. You may take your splint off at night and before taking a shower or bath. Wiggle your  toes in the splint several times per day to decrease swelling. SEEK MEDICAL CARE IF:   You have rapidly increasing bruising or swelling.  Your toes feel extremely cold or you lose feeling in your foot.  Your pain is not relieved with medicine. SEEK IMMEDIATE MEDICAL CARE IF:  Your toes are numb or blue.  You have severe pain that is increasing. MAKE SURE YOU:   Understand these instructions.  Will watch your condition.  Will get help right away if you are not doing well or get worse.   This information is not intended to replace advice given to you by your health care provider. Make sure you discuss any questions you  have with your health care provider.   Document Released: 12/19/2004 Document Revised: 01/09/2014 Document Reviewed: 12/31/2010 Elsevier Interactive Patient Education [employer].

## 2014-12-23 NOTE — Telephone Encounter (Signed)
Pt called checking on his xray results

## 2014-12-24 ENCOUNTER — Telehealth: Payer: Self-pay | Admitting: Primary Care

## 2014-12-24 NOTE — Telephone Encounter (Signed)
[facility] called to say that Lonnie Castro P.A looked over the xray report and said that the patient could FU with him in 5-7 days. At that time they will either cast him or give him a boot to wear.Patient has an appt on 01/01/15 at 8:30am with Lonnie Castro. Patient instructed to stay off his ankle and use crutches. Patient has a set and will use the crutches that he has.

## 2014-12-29 ENCOUNTER — Telehealth: Payer: Self-pay

## 2014-12-29 NOTE — Telephone Encounter (Signed)
PLEASE NOTE: All timestamps contained within this report are represented as [place] Standard Time. CONFIDENTIALTY NOTICE: This fax transmission is intended only for the addressee. It contains information that is legally privileged, confidential or otherwise protected from use or disclosure. If you are not the intended recipient, you are strictly prohibited from reviewing, disclosing, copying using or disseminating any of this information or taking any action in reliance on or regarding this information. If you have received this fax in error, please notify us immediately by telephone so that we can arrange for its return to [place]. Phone: [phone], Toll-Free: [phone], Fax: [phone] Page: 1 of 1 Call Id: [id number] [place]ock Patient Name: Lonnie Castro Brun Gender: Male DOB: 13-Oct-1965 Age: 49 Y 2 M 15 D Return Phone Number: [phone] (Primary), [phone] (Secondary) Address: City/State/Zip: [address] Night - Client Client Site Ault Physician Alma Friendly Contact Type Call Call Type Triage / Clinical Caller Name Flamur Shehee Relationship To Patient Spouse Return Phone Number [phone] (Primary) Chief Complaint Foot or Ankle Injury Initial Comment Caller states husband broke his foot, swelling, asking if they should put ice on it No Triage Reason Patient declined Nurse Assessment Nurse: Robina Ade, RN, Sarah Date/Time Eilene Ghazi Time): 12/29/2014 6:51:59 AM Confirm and document reason for call. If symptomatic, describe symptoms. ---Caller states husband has appointment for today and declines triage related to his broken foot. Has the patient traveled out of the country within the last 30 days? ---Not Applicable Does the patient have any new or worsening symptoms? ---Yes Will a triage be completed? ---No Select  reason for no triage. ---Patient declined Guidelines Guideline Title Affirmed Question Affirmed Notes Nurse Date/Time (Eastern Time) Disp. Time Eilene Ghazi Time) Disposition Final User 12/28/2014 3:29:26 PM Send To Extended Follow Up Tawni Levy 12/29/2014 6:55:17 AM Clinical Call Yes Robina Ade, RN, Sarah After Care Instructions Given Call Event Type User Date / Time Description

## 2014-12-29 NOTE — Telephone Encounter (Signed)
Pt has appt 12/29/14 at 2:30 with [name] NP for 6 mth f/u

## 2015-03-01 ENCOUNTER — Ambulatory Visit: Payer: BLUE CROSS/BLUE SHIELD | Admitting: Primary Care

## 2015-04-07 ENCOUNTER — Other Ambulatory Visit: Payer: Self-pay | Admitting: Primary Care

## 2015-04-07 DIAGNOSIS — M1A9XX0 Chronic gout, unspecified, without tophus (tophi): Secondary | ICD-10-CM

## 2015-04-07 MED ORDER — ALLOPURINOL 100 MG PO TABS: 200.0000 mg | ORAL_TABLET | Freq: Every day | ORAL | Status: DC

## 2015-04-07 NOTE — Telephone Encounter (Signed)
Received faxed refill request for allopurinal 100 mg tablet to [facility] to pharmacy.

## 2015-06-01 ENCOUNTER — Other Ambulatory Visit: Payer: Self-pay | Admitting: Primary Care

## 2015-06-01 DIAGNOSIS — J309 Allergic rhinitis, unspecified: Secondary | ICD-10-CM

## 2015-06-01 MED ORDER — FLUTICASONE PROPIONATE 50 MCG/ACT NA SUSP: 2.0000 | Freq: Every day | NASAL | Status: DC

## 2015-06-01 NOTE — Telephone Encounter (Signed)
Received refill request for fluticasone (FLONASE) 50 MCG/ACT nasal spray from [facility] on [address] prescribed on 05/25/2014. Last seen on 12/23/2014. No future appt.  Sent refill as requested.

## 2015-06-18 ENCOUNTER — Other Ambulatory Visit: Payer: Self-pay | Admitting: Primary Care

## 2015-06-18 ENCOUNTER — Other Ambulatory Visit (INDEPENDENT_AMBULATORY_CARE_PROVIDER_SITE_OTHER): Payer: BLUE CROSS/BLUE SHIELD

## 2015-06-18 ENCOUNTER — Ambulatory Visit (INDEPENDENT_AMBULATORY_CARE_PROVIDER_SITE_OTHER): Payer: BLUE CROSS/BLUE SHIELD | Admitting: Primary Care

## 2015-06-18 VITALS — BP 120/78 | HR 72 | Temp 98.0°F | Ht 70.0 in | Wt 240.8 lb

## 2015-06-18 DIAGNOSIS — H9319 Tinnitus, unspecified ear: Secondary | ICD-10-CM | POA: Insufficient documentation

## 2015-06-18 DIAGNOSIS — E785 Hyperlipidemia, unspecified: Secondary | ICD-10-CM | POA: Diagnosis not present

## 2015-06-18 DIAGNOSIS — H9313 Tinnitus, bilateral: Secondary | ICD-10-CM

## 2015-06-18 LAB — LIPID PANEL
CHOL/HDL RATIO: 4.8 ratio (ref <=5.0)
CHOLESTEROL: 197 mg/dL (ref 125–200)
HDL: 41 mg/dL (ref >=40)
LDL Cholesterol: 123 mg/dL (ref <130)
TRIGLYCERIDES: 165 mg/dL — AB (ref <150)
VLDL: 33 mg/dL — ABNORMAL HIGH (ref <30)

## 2015-06-18 NOTE — Patient Instructions (Addendum)
Stop Aspirin for now as this may cause ringing in the ears.  You will be contacted regarding your referral to Audiology.  Please let us know if you have not heard back within one week.   Schedule a lab only appointment at your convenience next week. Ensure you come fasting for 4 hours (water and black coffee only).  Continue your efforts towards a healthy lifestyle.  Check out [employer] for chocolate milk and ice cream!  It was a pleasure to see you today!

## 2015-06-18 NOTE — Progress Notes (Signed)
Pre visit review using our clinic review tool, if applicable. No additional management support is needed unless otherwise documented below in the visit note. 

## 2015-06-18 NOTE — Assessment & Plan Note (Signed)
Overall improvements in diet and is exercising regularly. Due for repeat lipid panel today, although is not fasting. Will have him schedule fasting labs for next week at his convenience. Continue fish oil for now.

## 2015-06-18 NOTE — Assessment & Plan Note (Signed)
Noticeable over the past several months. 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 Also managed on aspirin for which we will discontinue temporarily. Referral to audiology placed for further evaluation in case no improvement after removal of aspirin. Exam unremarkable.

## 2015-06-18 NOTE — Progress Notes (Signed)
Subjective:    Patient ID: Lonnie Castro, male    DOB: 1965/03/10, 50 y.o.   MRN: [id number]  HPI  Lonnie Castro is a 50 year old male who presents today for follow up.  1) Hyperlipidemia: Last lipid panel with elevation of TC, Triglycerides, LDL. We discussed to start Fish Oil and aspirin daily and to work on improvements in diet and exercise. He's been taking Fish Oil occasionally and aspirin daily.He and his wife are working to improve her diet and have been exercising.  His diet currently consists of: Breakfast: protein shake, egg whites with toast and [place] bacon Lunch: Grilled fish, steamed vegetables somedays, sandwiches Dinner: Occasional fast food, grilled lean meats, vegetables Snacks: Chocolate milk, fruit, protein bar  Desserts: Occasionally Beverages: Chocolate milk, water (4 bottles), coffee  Exercise: Daily. Cycles, works out at [employer], running.   Wt Readings from Last 3 Encounters:  06/18/15 240 lb 12.8 oz (109.226 kg)  12/23/14 241 lb 12.8 oz (109.68 kg)  11/03/14 232 lb (105.235 kg)     2) Tinnitus: Hears a ringing to bilateral ears that he's noticed consistently 1-2 times daily for the past several months. 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 He is also managed on aspirin. He also states that his wife has experienced the same symptoms. Denies fevers, pain, allergy symptoms.   Review of Systems  Constitutional: Negative for fever.  HENT: Positive for tinnitus. Negative for ear pain and sore throat.   Respiratory: Negative for shortness of breath.   Cardiovascular: Negative for chest pain.  Allergic/Immunologic: Negative for environmental allergies.  Neurological: Negative for headaches.       Past Medical History  Diagnosis Date  . Gout   . Allergic rhinitis   .  Orchitis   . Arthritis      Social History   Social History  . Marital Status: Married    Spouse Name: N/A  . Number of Children: N/A  . Years of Education: N/A   Occupational History  . Not on file.   Social History Main Topics  . Smoking status: Never Smoker   . Smokeless tobacco: Never Used  . Alcohol Use: 2.4 oz/week    4 Standard drinks or equivalent per week  . Drug Use: No  . Sexual Activity: Not on file   Other Topics Concern  . Not on file   Social History Narrative   Married.   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   Biomedical engineer.   Enjoys exercising, relaxing, flying, snowboarding.       Past Surgical History  Procedure Laterality Date  . Hernia repair  1984  . Nasal septum surgery  1987  . Colonoscopy with propofol N/A 11/03/2014    Procedure: COLONOSCOPY WITH PROPOFOL;  Surgeon: Lollie Sails, MD;  Location: [facility] ENDOSCOPY;  Service: Endoscopy;  Laterality: N/A;    Family History  Problem Relation Age of Onset  . Cancer Father     No Known Allergies  Current Outpatient Prescriptions on File Prior to Visit  Medication Sig Dispense Refill  . allopurinol (ZYLOPRIM) 100 MG tablet Take 2 tablets (200 mg total) by mouth daily. 60 tablet 5  . fluticasone (FLONASE) 50 MCG/ACT nasal spray Place 2 sprays into both nostrils  daily. 16 g 11  . colchicine 0.6 MG tablet Take 0.6 mg by mouth daily. Reported on 06/18/2015     No current facility-administered medications on file prior to visit.    BP 120/78 mmHg  Pulse 72  Temp(Src) 98 F (36.7 C) (Oral)  Ht 5\' 10"  (1.778 m)  Wt 240 lb 12.8 oz (109.226 kg)  BMI 34.55 kg/m2  SpO2 96%    Objective:   Physical Exam  Constitutional: He appears well-nourished.  HENT:  Right Ear: Tympanic membrane and ear canal normal.  Left Ear: Tympanic membrane and ear canal normal.  Cardiovascular: Normal rate and regular rhythm.   Pulmonary/Chest: Effort normal and breath sounds normal.    Skin: Skin is warm and dry.          Assessment & Plan:

## 2015-06-25 ENCOUNTER — Encounter: Payer: Self-pay | Admitting: Primary Care

## 2015-06-25 ENCOUNTER — Ambulatory Visit (INDEPENDENT_AMBULATORY_CARE_PROVIDER_SITE_OTHER): Payer: BLUE CROSS/BLUE SHIELD | Admitting: Primary Care

## 2015-06-25 VITALS — BP 118/74 | HR 72 | Temp 98.2°F | Ht 70.0 in | Wt 240.0 lb

## 2015-06-25 DIAGNOSIS — J3489 Other specified disorders of nose and nasal sinuses: Secondary | ICD-10-CM

## 2015-06-25 MED ORDER — PREDNISONE 10 MG PO TABS: ORAL_TABLET | ORAL | Status: DC

## 2015-06-25 NOTE — Progress Notes (Signed)
Pre visit review using our clinic review tool, if applicable. No additional management support is needed unless otherwise documented below in the visit note. 

## 2015-06-25 NOTE — Patient Instructions (Signed)
Start Prednisone tablets for headache/sinus pressure. Take 3 tablets for 2 days, then 2 tablets for 2 days, then 1 tablet for 2 days.  Please call or e-mail me if no improvement by Tuesday next week.  Continue Flonase and Zyrtec.  It was a pleasure to see you today!

## 2015-06-25 NOTE — Progress Notes (Signed)
Subjective:    Patient ID: Devery Oberhelman, male    DOB: 1965-11-02, 50 y.o.   MRN: [id number]  HPI  Mr. Piccini is a 50 year old male who presents today with a chief complaint of headache. He also reports fatigue. He's not blowing anything out of his nasal cavity, denies sinus pressure, and doesn't feel acutely ill. He has a history of these headaches/sinus pressure incidences in the past which have typically reduced with Flonase and Zyrtec.  He's been taking Zyrtec, ibuprofen, Excedrin migraine, an Flonase with temporary improvement. Denies fevers, cough, post nasal drip, sinus pressure, rhinorrhea.   Review of Systems  Constitutional: Negative for fever.  HENT: Positive for congestion. Negative for ear pain, sinus pressure, sneezing and sore throat.   Respiratory: Negative for cough and shortness of breath.   Musculoskeletal: Negative for myalgias.  Neurological: Positive for headaches.       Past Medical History  Diagnosis Date  . Gout   . Allergic rhinitis   . Orchitis   . Arthritis      Social History   Social History  . Marital Status: Married    Spouse Name: N/A  . Number of Children: N/A  . Years of Education: N/A   Occupational History  . Not on file.   Social History Main Topics  . Smoking status: Never Smoker   . Smokeless tobacco: Never Used  . Alcohol Use: 2.4 oz/week    4 Standard drinks or equivalent per week  . Drug Use: No  . Sexual Activity: Not on file   Other Topics Concern  . Not on file   Social History Narrative   Married.   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   Biomedical engineer.   Enjoys exercising, relaxing, flying, snowboarding.       Past Surgical History  Procedure Laterality Date  . Hernia repair  1984  . Nasal septum surgery  1987  . Colonoscopy with propofol N/A 11/03/2014    Procedure: COLONOSCOPY WITH PROPOFOL;  Surgeon: Lollie Sails, MD;  Location: [facility] ENDOSCOPY;  Service: Endoscopy;  Laterality: N/A;      Family History  Problem Relation Age of Onset  . Cancer Father     No Known Allergies  Current Outpatient Prescriptions on File Prior to Visit  Medication Sig Dispense Refill  . allopurinol (ZYLOPRIM) 100 MG tablet Take 2 tablets (200 mg total) by mouth daily. 60 tablet 5  . colchicine 0.6 MG tablet Take 0.6 mg by mouth daily. Reported on 06/18/2015    . fluticasone (FLONASE) 50 MCG/ACT nasal spray Place 2 sprays into both nostrils daily. 16 g 11   No current facility-administered medications on file prior to visit.    BP 118/74 mmHg  Pulse 72  Temp(Src) 98.2 F (36.8 C) (Oral)  Ht 5\' 10"  (1.778 m)  Wt 240 lb (108.863 kg)  BMI 34.44 kg/m2  SpO2 97%    Objective:   Physical Exam  Constitutional: He appears well-nourished. He does not appear ill.  HENT:  Right Ear: Tympanic membrane and ear canal normal.  Left Ear: Tympanic membrane and ear canal normal.  Nose: Mucosal edema present. Right sinus exhibits no maxillary sinus tenderness and no frontal sinus tenderness. Left sinus exhibits no maxillary sinus tenderness and no frontal sinus tenderness.  Mouth/Throat: Oropharynx is clear and moist.  Eyes: Conjunctivae are normal.  Neck: Neck supple.  Cardiovascular: Normal rate and regular rhythm.   Pulmonary/Chest: Effort normal and breath sounds normal.  He has no wheezes. He has no rales.  Skin: Skin is warm and dry.          Assessment & Plan:  Headache vs Sinusitis:  Temporal headache bilaterally without photophobia and nausea. No sinus pressure or nasal mucosal discharge.  Exam without tenderness to frontal and maxillary sinuses, does not appear acutely ill, lungs clear. Will start with low dose prednisone taper to reduce headache pressure. If no improvement by Tuesday next week then acceptable to treat with antibiotics. Continue Flonase and Zyrtec.  Not completely convinced he has bacterial involvement at this point.  Sheral Flow, NP

## 2015-07-14 DIAGNOSIS — H6123 Impacted cerumen, bilateral: Secondary | ICD-10-CM | POA: Diagnosis not present

## 2015-07-14 DIAGNOSIS — H903 Sensorineural hearing loss, bilateral: Secondary | ICD-10-CM | POA: Diagnosis not present

## 2015-07-14 DIAGNOSIS — H60542 Acute eczematoid otitis externa, left ear: Secondary | ICD-10-CM | POA: Diagnosis not present

## 2015-07-14 DIAGNOSIS — H9313 Tinnitus, bilateral: Secondary | ICD-10-CM | POA: Diagnosis not present

## 2015-08-12 DIAGNOSIS — H5213 Myopia, bilateral: Secondary | ICD-10-CM | POA: Diagnosis not present

## 2015-08-12 DIAGNOSIS — H5210 Myopia, unspecified eye: Secondary | ICD-10-CM | POA: Diagnosis not present

## 2015-09-23 ENCOUNTER — Encounter: Payer: Self-pay | Admitting: Family Medicine

## 2015-09-23 ENCOUNTER — Ambulatory Visit (INDEPENDENT_AMBULATORY_CARE_PROVIDER_SITE_OTHER): Payer: Self-pay | Admitting: Family Medicine

## 2015-09-23 VITALS — BP 108/72 | HR 76 | Ht 70.3 in | Wt 238.0 lb

## 2015-09-23 DIAGNOSIS — Z0289 Encounter for other administrative examinations: Secondary | ICD-10-CM

## 2015-09-23 NOTE — Progress Notes (Signed)
   BP 108/72 (BP Location: Left Arm, Patient Position: Sitting, Cuff Size: Normal)   Pulse 76   Ht 5' 10.3" (1.786 m)   Wt 238 lb (108 kg)   BMI 33.86 kg/m    Subjective:    Patient ID: Lonnie Castro, male    DOB: March 27, 1965, 50 y.o.   MRN: [id number]  HPI: Bryen Riedinger is a 50 y.o. male  Chief Complaint  Patient presents with  . Class II Flight Physical  Gout well controlled with no side effects or symptoms  Relevant past medical, surgical, family and social history reviewed and updated as indicated. Interim medical history since our last visit reviewed. Allergies and medications reviewed and updated.  Review of Systems  Constitutional: Negative.   HENT: Negative.   Eyes: Negative.   Respiratory: Negative.   Cardiovascular: Negative.   Gastrointestinal: Negative.   Endocrine: Negative.   Genitourinary: Negative.   Musculoskeletal: Negative.   Skin: Negative.   Allergic/Immunologic: Negative.   Neurological: Negative.   Hematological: Negative.   Psychiatric/Behavioral: Negative.     Per HPI unless specifically indicated above     Objective:    BP 108/72 (BP Location: Left Arm, Patient Position: Sitting, Cuff Size: Normal)   Pulse 76   Ht 5' 10.3" (1.786 m)   Wt 238 lb (108 kg)   BMI 33.86 kg/m   Wt Readings from Last 3 Encounters:  09/23/15 238 lb (108 kg)  06/25/15 240 lb (108.9 kg)  06/18/15 240 lb 12.8 oz (109.2 kg)    Physical Exam  Constitutional: He is oriented to person, place, and time. He appears well-developed and well-nourished.  HENT:  Head: Normocephalic.  Right Ear: External ear normal.  Left Ear: External ear normal.  Nose: Nose normal.  Eyes: Conjunctivae and EOM are normal. Pupils are equal, round, and reactive to light.  Neck: Normal range of motion. Neck supple. No thyromegaly present.  Cardiovascular: Normal rate, regular rhythm, normal heart sounds and intact distal pulses.   Pulmonary/Chest: Effort normal and breath sounds normal.    Abdominal: Soft. Bowel sounds are normal. There is no splenomegaly or hepatomegaly.  Genitourinary: Penis normal.  Musculoskeletal: Normal range of motion.  Lymphadenopathy:    He has no cervical adenopathy.  Neurological: He is alert and oriented to person, place, and time. He has normal reflexes.  Skin: Skin is warm and dry.  Psychiatric: He has a normal mood and affect. His behavior is normal. Judgment and thought content normal.    Results for orders placed or performed in visit on 06/18/15  Lipid panel  Result Value Ref Range   Cholesterol 197 125 - 200 mg/dL   Triglycerides 165 (H) <150 mg/dL   HDL 41 >=40 mg/dL   Total CHOL/HDL Ratio 4.8 <=5.0 Ratio   VLDL 33 (H) <30 mg/dL   LDL Cholesterol 123 <130 mg/dL      Assessment & Plan:   Problem List Items Addressed This Visit    None    Visit Diagnoses    History and physical examination, occupation    -  Primary       Follow up plan: Return for As scheduled.

## 2016-04-24 DIAGNOSIS — L821 Other seborrheic keratosis: Secondary | ICD-10-CM | POA: Diagnosis not present

## 2016-04-24 DIAGNOSIS — L814 Other melanin hyperpigmentation: Secondary | ICD-10-CM | POA: Diagnosis not present

## 2016-04-24 DIAGNOSIS — D229 Melanocytic nevi, unspecified: Secondary | ICD-10-CM | POA: Diagnosis not present

## 2016-05-20 ENCOUNTER — Other Ambulatory Visit: Payer: Self-pay | Admitting: Primary Care

## 2016-05-20 DIAGNOSIS — M1A9XX0 Chronic gout, unspecified, without tophus (tophi): Secondary | ICD-10-CM

## 2016-05-31 ENCOUNTER — Other Ambulatory Visit: Payer: Self-pay | Admitting: Primary Care

## 2016-05-31 DIAGNOSIS — J309 Allergic rhinitis, unspecified: Secondary | ICD-10-CM

## 2016-06-05 ENCOUNTER — Other Ambulatory Visit: Payer: Self-pay | Admitting: Primary Care

## 2016-06-05 DIAGNOSIS — J309 Allergic rhinitis, unspecified: Secondary | ICD-10-CM

## 2016-08-14 ENCOUNTER — Encounter: Payer: Self-pay | Admitting: Primary Care

## 2016-08-14 ENCOUNTER — Ambulatory Visit (INDEPENDENT_AMBULATORY_CARE_PROVIDER_SITE_OTHER): Payer: BLUE CROSS/BLUE SHIELD | Admitting: Primary Care

## 2016-08-14 VITALS — BP 112/70 | HR 79 | Temp 98.6°F | Ht 70.0 in | Wt 240.4 lb

## 2016-08-14 DIAGNOSIS — E785 Hyperlipidemia, unspecified: Secondary | ICD-10-CM

## 2016-08-14 DIAGNOSIS — J3089 Other allergic rhinitis: Secondary | ICD-10-CM | POA: Diagnosis not present

## 2016-08-14 DIAGNOSIS — M1A9XX0 Chronic gout, unspecified, without tophus (tophi): Secondary | ICD-10-CM | POA: Diagnosis not present

## 2016-08-14 LAB — COMPREHENSIVE METABOLIC PANEL
ALBUMIN: 4.5 g/dL (ref 3.5–5.2)
ALK PHOS: 39 U/L (ref 39–117)
ALT: 30 U/L (ref 0–53)
AST: 20 U/L (ref 0–37)
BILIRUBIN TOTAL: 0.8 mg/dL (ref 0.2–1.2)
BUN: 14 mg/dL (ref 6–23)
CO2: 29 mEq/L (ref 19–32)
CREATININE: 1.14 mg/dL (ref 0.40–1.50)
Calcium: 9 mg/dL (ref 8.4–10.5)
Chloride: 102 mEq/L (ref 96–112)
GFR: 71.6 mL/min (ref >60.00)
GLUCOSE: 120 mg/dL — AB (ref 70–99)
POTASSIUM: 4.3 meq/L (ref 3.5–5.1)
SODIUM: 138 meq/L (ref 135–145)
TOTAL PROTEIN: 6.3 g/dL (ref 6.0–8.3)

## 2016-08-14 LAB — LIPID PANEL
CHOLESTEROL: 183 mg/dL (ref 0–200)
HDL: 33.7 mg/dL — ABNORMAL LOW (ref >39.00)
NonHDL: 149.27
Total CHOL/HDL Ratio: 5
Triglycerides: 222 mg/dL — ABNORMAL HIGH (ref 0.0–149.0)
VLDL: 44.4 mg/dL — AB (ref 0.0–40.0)

## 2016-08-14 LAB — LDL CHOLESTEROL, DIRECT: Direct LDL: 125 mg/dL

## 2016-08-14 MED ORDER — ALLOPURINOL 100 MG PO TABS: 100.0000 mg | ORAL_TABLET | Freq: Every day | ORAL | 3 refills | Status: DC

## 2016-08-14 MED ORDER — COLCHICINE 0.6 MG PO TABS: ORAL_TABLET | ORAL | 1 refills | Status: DC

## 2016-08-14 NOTE — Assessment & Plan Note (Signed)
Doing well on Flonase. Continue same.  

## 2016-08-14 NOTE — Assessment & Plan Note (Signed)
Taking irregularly, mostly 50 mg once daily. Occasional joint aches on 50 mg dose. Two gout flares since. Will reduce dose to 100 mg, recommended he start with 100 mg consistently and gradually wean down to 50 mg if tolerable. Refill for colchicine sent to pharmacy. CMP pending.

## 2016-08-14 NOTE — Assessment & Plan Note (Signed)
Repeat lipid panel pending. Discussed the importance of a healthy diet and regular exercise in order for weight loss, and to reduce the risk of other medical problems.

## 2016-08-14 NOTE — Patient Instructions (Signed)
I sent refills for the allopurinol and colchicine to your pharmacy.  Complete lab work prior to leaving today. I will notify you of your results once received.   It was a pleasure to see you today!

## 2016-08-14 NOTE — Progress Notes (Signed)
   Subjective:    Patient ID: Lonnie Castro, male    DOB: 08-20-1965, 51 y.o.   MRN: [id number]  HPI  Lonnie Castro is a 51 year old male who presents today for medication refill. He's not been seen in our clinic in over 1 year.   1) Chronic Gout: Currently managed on allopurinol 200 mg daily, colchicine 0.6 mg PRN. Over the past 6 months he's been taking the allopurinol infrequently. He will mostly take 1/2 tablet of allopurinol daily, sometimes misses a few days then will take 1-2 tablets at a time. Only two flares in the past 6 months where he had to take colchicine. Overall doing well on the reduced dose.   2) Hyperlipidemia: Intermittently working on diet and exercise. Is fasting this morning except for powdered cream in his coffee. Last lipid panel borderline.   Review of Systems  Respiratory: Negative for shortness of breath.   Cardiovascular: Negative for chest pain.  Musculoskeletal: Negative for arthralgias and joint swelling.  Skin: Negative for color change.       Past Medical History:  Diagnosis Date  . Allergic rhinitis   . Arthritis   . Gout   . Orchitis      Social History   Social History  . Marital status: Married    Spouse name: N/A  . Number of children: N/A  . Years of education: N/A   Occupational History  . Not on file.   Social History Main Topics  . Smoking status: Never Smoker  . Smokeless tobacco: Never Used  . Alcohol use 2.4 oz/week    4 Standard drinks or equivalent per week  . Drug use: No  . Sexual activity: Not on file   Other Topics Concern  . Not on file   Social History Narrative   Married.   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   Biomedical engineer.   Enjoys exercising, relaxing, flying, snowboarding.       Past Surgical History:  Procedure Laterality Date  . COLONOSCOPY WITH PROPOFOL N/A 11/03/2014   Procedure: COLONOSCOPY WITH PROPOFOL;  Surgeon: Lollie Sails, MD;  Location: [facility] ENDOSCOPY;  Service:  Endoscopy;  Laterality: N/A;  . HERNIA REPAIR  1984  . NASAL SEPTUM SURGERY  1987    Family History  Problem Relation Age of Onset  . Cancer Father     No Known Allergies  Current Outpatient Prescriptions on File Prior to Visit  Medication Sig Dispense Refill  . fluticasone (FLONASE) 50 MCG/ACT nasal spray instill 2 sprays into each nostril once daily 16 g 11   No current facility-administered medications on file prior to visit.     BP 112/70   Pulse 79   Temp 98.6 F (37 C) (Oral)   Ht 5\' 10"  (1.778 m)   Wt 240 lb 6.4 oz (109 kg)   SpO2 97%   BMI 34.49 kg/m    Objective:   Physical Exam  Constitutional: He appears well-nourished.  Neck: Neck supple.  Cardiovascular: Normal rate and regular rhythm.   Pulmonary/Chest: Effort normal and breath sounds normal.  Skin: Skin is warm and dry.          Assessment & Plan:

## 2017-05-11 ENCOUNTER — Ambulatory Visit: Payer: BLUE CROSS/BLUE SHIELD | Admitting: Primary Care

## 2017-05-11 ENCOUNTER — Encounter: Payer: Self-pay | Admitting: Primary Care

## 2017-05-11 VITALS — BP 122/84 | HR 78 | Temp 98.3°F | Ht 70.0 in | Wt 238.0 lb

## 2017-05-11 DIAGNOSIS — R002 Palpitations: Secondary | ICD-10-CM | POA: Diagnosis not present

## 2017-05-11 DIAGNOSIS — E785 Hyperlipidemia, unspecified: Secondary | ICD-10-CM

## 2017-05-11 NOTE — Patient Instructions (Addendum)
Stop by the lab prior to leaving today. I will notify you of your results once received.   Ensure you are consuming 64 ounces of water daily.  Work on weight loss through a healthy diet and regular exercise. Start exercising. You should be getting 150 minutes of moderate intensity exercise weekly.  Please notify me if your symptoms persist.   I'll be in touch soon! It was a pleasure to see you today!

## 2017-05-11 NOTE — Assessment & Plan Note (Signed)
Repeat lipids pending.  

## 2017-05-11 NOTE — Progress Notes (Signed)
Subjective:    Patient ID: Lonnie Castro, male    DOB: 12-15-1965, 52 y.o.   MRN: [id number]  HPI  Mr. Moffa is a 52 year old male with a history of hyperlipidemia who presents today with a chief complaint of elevated blood pressure readings.  BP Readings from Last 3 Encounters:  05/11/17 122/90  08/14/16 112/70  09/23/15 108/72   He was sitting at his desk last week at home when he felt mild palpitations and head pressure. He's continued to notice these symptoms more mildly but constantly. He doesn't feel as though the room is spinning around him. He's been stressed (positive stress) with work and personal life. He smokes one cigar daily, drinks one beer or glass of wine daily. He is not exercising.   He checked his BP at home once with a reading of 134/80. He denies chest pain, shortness of breath, headaches, syncope. He does drink a lot of caffeine, some water.  Review of Systems  Constitutional: Negative for fatigue and fever.  Respiratory: Negative for shortness of breath.   Cardiovascular: Positive for palpitations. Negative for chest pain.  Neurological: Negative for dizziness, syncope, weakness, light-headedness and headaches.       Head pressure  Psychiatric/Behavioral:       Increased (positive) stress       Past Medical History:  Diagnosis Date  . Allergic rhinitis   . Arthritis   . Gout   . Orchitis      Social History   Socioeconomic History  . Marital status: Married    Spouse name: Not on file  . Number of children: Not on file  . Years of education: Not on file  . Highest education level: Not on file  Occupational History  . Not on file  Social Needs  . Financial resource strain: Not on file  . Food insecurity:    Worry: Not on file    Inability: Not on file  . Transportation needs:    Medical: Not on file    Non-medical: Not on file  Tobacco Use  . Smoking status: Never Smoker  . Smokeless tobacco: Never Used  Substance and Sexual Activity    . Alcohol use: Yes    Alcohol/week: 2.4 oz    Types: 4 Standard drinks or equivalent per week  . Drug use: No  . Sexual activity: Not on file  Lifestyle  . Physical activity:    Days per week: Not on file    Minutes per session: Not on file  . Stress: Not on file  Relationships  . Social connections:    Talks on phone: Not on file    Gets together: Not on file    Attends religious service: Not on file    Active member of club or organization: Not on file    Attends meetings of clubs or organizations: Not on file    Relationship status: Not on file  . Intimate partner violence:    Fear of current or ex partner: Not on file    Emotionally abused: Not on file    Physically abused: Not on file    Forced sexual activity: Not on file  Other Topics Concern  . Not on file  Social History Narrative   Married.   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   Biomedical engineer.   Enjoys exercising, relaxing, flying, snowboarding.    Past Surgical History:  Procedure Laterality Date  . COLONOSCOPY WITH PROPOFOL N/A 11/03/2014  Procedure: COLONOSCOPY WITH PROPOFOL;  Surgeon: Lollie Sails, MD;  Location: [facility] ENDOSCOPY;  Service: Endoscopy;  Laterality: N/A;  . HERNIA REPAIR  1984  . NASAL SEPTUM SURGERY  1987    Family History  Problem Relation Age of Onset  . Cancer Father     No Known Allergies  Current Outpatient Medications on File Prior to Visit  Medication Sig Dispense Refill  . allopurinol (ZYLOPRIM) 100 MG tablet Take 1 tablet (100 mg total) by mouth daily. 90 tablet 3  . colchicine 0.6 MG tablet Take two tablets at flare onset. Repeat with one tablet one hour later. Subsequent days take 1 tablet once daily until flare resolves. 30 tablet 1  . fluticasone (FLONASE) 50 MCG/ACT nasal spray instill 2 sprays into each nostril once daily 16 g 11   No current facility-administered medications on file prior to visit.     BP 122/84   Pulse 78   Temp 98.3 F  (36.8 C) (Oral)   Ht 5\' 10"  (1.778 m)   Wt 238 lb (108 kg)   SpO2 96%   BMI 34.15 kg/m    Objective:   Physical Exam  Constitutional: He appears well-nourished.  Neck: Neck supple.  Cardiovascular: Normal rate, regular rhythm and normal heart sounds.  Pulmonary/Chest: Effort normal and breath sounds normal.  Skin: Skin is warm and dry.  Psychiatric: He has a normal mood and affect.          Assessment & Plan:  Palpitations:  Also with "head pressure" x 1 week, consistent and mild in characteristic.  Exam today unremarkable. BP in the office is normal.  ECG with NSR with rate of 68, t-wave inversion in V2, no ST-elevation or depression, PAC/PVC. No prior ECG on file. Check CBC, CMP, Lipids, TSH, A1C. Discussed to work on proper hydration with water, limit caffeine, stop smoking, start exercising.  [name], NP

## 2017-05-12 LAB — CBC
HEMATOCRIT: 49.1 % (ref 38.5–50.0)
HEMOGLOBIN: 17.3 g/dL — AB (ref 13.2–17.1)
MCH: 31.8 pg (ref 27.0–33.0)
MCHC: 35.2 g/dL (ref 32.0–36.0)
MCV: 90.3 fL (ref 80.0–100.0)
MPV: 11.3 fL (ref 7.5–12.5)
Platelets: 205 10*3/uL (ref 140–400)
RBC: 5.44 10*6/uL (ref 4.20–5.80)
RDW: 12.7 % (ref 11.0–15.0)
WBC: 6.7 10*3/uL (ref 3.8–10.8)

## 2017-05-12 LAB — COMPREHENSIVE METABOLIC PANEL
AG RATIO: 2.1 (calc) (ref 1.0–2.5)
ALT: 20 U/L (ref 9–46)
AST: 15 U/L (ref 10–35)
Albumin: 4.5 g/dL (ref 3.6–5.1)
Alkaline phosphatase (APISO): 47 U/L (ref 40–115)
BUN: 16 mg/dL (ref 7–25)
CO2: 28 mmol/L (ref 20–32)
CREATININE: 1.18 mg/dL (ref 0.70–1.33)
Calcium: 9.6 mg/dL (ref 8.6–10.3)
Chloride: 100 mmol/L (ref 98–110)
GLUCOSE: 103 mg/dL — AB (ref 65–99)
Globulin: 2.1 g/dL (calc) (ref 1.9–3.7)
Potassium: 4.6 mmol/L (ref 3.5–5.3)
SODIUM: 139 mmol/L (ref 135–146)
Total Bilirubin: 0.5 mg/dL (ref 0.2–1.2)
Total Protein: 6.6 g/dL (ref 6.1–8.1)

## 2017-05-12 LAB — LIPID PANEL
CHOL/HDL RATIO: 6 (calc) — AB (ref <5.0)
Cholesterol: 215 mg/dL — ABNORMAL HIGH (ref <200)
HDL: 36 mg/dL — AB (ref >40)
LDL CHOLESTEROL (CALC): 129 mg/dL — AB
NON-HDL CHOLESTEROL (CALC): 179 mg/dL — AB (ref <130)
TRIGLYCERIDES: 350 mg/dL — AB (ref <150)

## 2017-05-12 LAB — HEMOGLOBIN A1C
EAG (MMOL/L): 5.7 (calc)
HEMOGLOBIN A1C: 5.2 %{Hb} (ref <5.7)
MEAN PLASMA GLUCOSE: 103 (calc)

## 2017-05-12 LAB — TSH: TSH: 1.04 m[IU]/L (ref 0.40–4.50)

## 2017-06-15 ENCOUNTER — Other Ambulatory Visit: Payer: Self-pay | Admitting: Primary Care

## 2017-07-03 DIAGNOSIS — H5213 Myopia, bilateral: Secondary | ICD-10-CM | POA: Diagnosis not present

## 2017-10-18 ENCOUNTER — Other Ambulatory Visit: Payer: Self-pay | Admitting: Primary Care

## 2017-10-18 DIAGNOSIS — M1A9XX0 Chronic gout, unspecified, without tophus (tophi): Secondary | ICD-10-CM

## 2017-10-19 NOTE — Telephone Encounter (Signed)
Last prescribed on 08/14/2016  Last office visit on 05/11/2017

## 2017-10-20 NOTE — Telephone Encounter (Signed)
Needs CPE or office visit follow up for refills. Please schedule. I will send a 30 day supply.

## 2017-10-23 NOTE — Telephone Encounter (Signed)
lvm asking pt to call back to schedule °

## 2017-12-07 ENCOUNTER — Other Ambulatory Visit: Payer: Self-pay | Admitting: Primary Care

## 2017-12-07 DIAGNOSIS — M1A9XX0 Chronic gout, unspecified, without tophus (tophi): Secondary | ICD-10-CM

## 2017-12-07 MED ORDER — ALLOPURINOL 100 MG PO TABS: 100.0000 mg | ORAL_TABLET | Freq: Every day | ORAL | 0 refills | Status: DC

## 2017-12-07 NOTE — Telephone Encounter (Signed)
Last prescribed on 10/20/2017 Last office visit on 05/11/2017. Next CPE on 12/19/2017

## 2017-12-07 NOTE — Telephone Encounter (Signed)
Noted, refill sent to pharmacy. 

## 2017-12-07 NOTE — Telephone Encounter (Signed)
Pt need refill for Allopurinol 100 mg   Sent to [facility]

## 2017-12-19 ENCOUNTER — Encounter: Payer: Self-pay | Admitting: Primary Care

## 2017-12-19 ENCOUNTER — Ambulatory Visit (INDEPENDENT_AMBULATORY_CARE_PROVIDER_SITE_OTHER): Payer: BLUE CROSS/BLUE SHIELD | Admitting: Primary Care

## 2017-12-19 ENCOUNTER — Other Ambulatory Visit: Payer: Self-pay | Admitting: Primary Care

## 2017-12-19 VITALS — BP 124/82 | HR 77 | Temp 98.4°F | Ht 70.0 in | Wt 239.5 lb

## 2017-12-19 DIAGNOSIS — Z125 Encounter for screening for malignant neoplasm of prostate: Secondary | ICD-10-CM

## 2017-12-19 DIAGNOSIS — E782 Mixed hyperlipidemia: Secondary | ICD-10-CM | POA: Diagnosis not present

## 2017-12-19 DIAGNOSIS — M1A9XX0 Chronic gout, unspecified, without tophus (tophi): Secondary | ICD-10-CM | POA: Diagnosis not present

## 2017-12-19 DIAGNOSIS — M109 Gout, unspecified: Secondary | ICD-10-CM | POA: Diagnosis not present

## 2017-12-19 DIAGNOSIS — Z1211 Encounter for screening for malignant neoplasm of colon: Secondary | ICD-10-CM

## 2017-12-19 DIAGNOSIS — Z0000 Encounter for general adult medical examination without abnormal findings: Secondary | ICD-10-CM

## 2017-12-19 MED ORDER — ALLOPURINOL 100 MG PO TABS: 100.0000 mg | ORAL_TABLET | Freq: Every day | ORAL | 3 refills | Status: DC

## 2017-12-19 MED ORDER — COLCHICINE 0.6 MG PO TABS: ORAL_TABLET | ORAL | 0 refills | Status: DC

## 2017-12-19 NOTE — Progress Notes (Signed)
Subjective:    Patient ID: Lonnie Castro, male    DOB: 05/26/1965, 52 y.o.   MRN: [id number]  HPI  Lonnie Castro is a 52 year old male who presents today for complete physical.  Immunizations: -Tetanus: Completed in 2016 -Influenza: Due today  Diet: He endorses a healthy diet Breakfast: Protein drink, eggs, toast Lunch: Chicken, fish, vegetables, sometimes sandwich Dinner: Meat, vegetables, starch Snacks: Occasionally fruit, nuts Desserts: None Beverages: Water, coffee, alcohol, soda  Exercise: He is walking, sometimes run/sprints 2-3 miles several days weekly, going to the gym several days weekly. Eye exam: Completed in 2019 Dental exam: Completes semi-annually  Colonoscopy: Completed in 2016 PSA:  BP Readings from Last 3 Encounters:  12/19/17 124/82  05/11/17 122/84  08/14/16 112/70   Wt Readings from Last 3 Encounters:  12/19/17 239 lb 8 oz (108.6 kg)  05/11/17 238 lb (108 kg)  08/14/16 240 lb 6.4 oz (109 kg)     Review of Systems  Constitutional: Negative for unexpected weight change.  HENT: Negative for rhinorrhea.   Respiratory: Negative for cough and shortness of breath.   Cardiovascular: Negative for chest pain.  Gastrointestinal: Negative for constipation and diarrhea.  Genitourinary: Negative for difficulty urinating.  Musculoskeletal: Negative for arthralgias and myalgias.  Skin: Negative for rash.  Allergic/Immunologic: Negative for environmental allergies.  Neurological: Negative for dizziness, numbness and headaches.  Psychiatric/Behavioral: The patient is not nervous/anxious.        Past Medical History:  Diagnosis Date  . Allergic rhinitis   . Arthritis   . Gout   . Orchitis      Social History   Socioeconomic History  . Marital status: Married    Spouse name: Not on file  . Number of children: Not on file  . Years of education: Not on file  . Highest education level: Not on file  Occupational History  . Not on file  Social Needs  .  Financial resource strain: Not on file  . Food insecurity:    Worry: Not on file    Inability: Not on file  . Transportation needs:    Medical: Not on file    Non-medical: Not on file  Tobacco Use  . Smoking status: Never Smoker  . Smokeless tobacco: Never Used  Substance and Sexual Activity  . Alcohol use: Yes    Alcohol/week: 4.0 standard drinks    Types: 4 Standard drinks or equivalent per week  . Drug use: No  . Sexual activity: Not on file  Lifestyle  . Physical activity:    Days per week: Not on file    Minutes per session: Not on file  . Stress: Not on file  Relationships  . Social connections:    Talks on phone: Not on file    Gets together: Not on file    Attends religious service: Not on file    Active member of club or organization: Not on file    Attends meetings of clubs or organizations: Not on file    Relationship status: Not on file  . Intimate partner violence:    Fear of current or ex partner: Not on file    Emotionally abused: Not on file    Physically abused: Not on file    Forced sexual activity: Not on file  Other Topics Concern  . Not on file  Social History Narrative   Married.   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   Biomedical engineer.   Enjoys exercising,  relaxing, flying, snowboarding.    Past Surgical History:  Procedure Laterality Date  . COLONOSCOPY WITH PROPOFOL N/A 11/03/2014   Procedure: COLONOSCOPY WITH PROPOFOL;  Surgeon: Lollie Sails, MD;  Location: [facility]COPY;  Service: Endoscopy;  Laterality: N/A;  . HERNIA REPAIR  1984  . NASAL SEPTUM SURGERY  1987    Family History  Problem Relation Age of Onset  . Cancer Father     No Known Allergies  Current Outpatient Medications on File Prior to Visit  Medication Sig Dispense Refill  . fluticasone (FLONASE) 50 MCG/ACT nasal spray USE 2 SPRAYS IN EACH NOSTRIL ONCE DAILY 16 g 5   No current facility-administered medications on file prior to visit.     BP  124/82   Pulse 77   Temp 98.4 F (36.9 C) (Oral)   Ht 5\' 10"  (1.778 m)   Wt 239 lb 8 oz (108.6 kg)   SpO2 98%   BMI 34.36 kg/m    Objective:   Physical Exam  Constitutional: He is oriented to person, place, and time. He appears well-nourished.  HENT:  Mouth/Throat: No oropharyngeal exudate.  Eyes: Pupils are equal, round, and reactive to light. EOM are normal.  Neck: Neck supple. No thyromegaly present.  Cardiovascular: Normal rate and regular rhythm.  Respiratory: Effort normal and breath sounds normal.  GI: Soft. Bowel sounds are normal. There is no abdominal tenderness.  Musculoskeletal: Normal range of motion.  Neurological: He is alert and oriented to person, place, and time.  Skin: Skin is warm and dry.  Psychiatric: He has a normal mood and affect.           Assessment & Plan:

## 2017-12-19 NOTE — Assessment & Plan Note (Signed)
Tetanus UTD, influenza vaccination provided today. PSA pending. Colonoscopy due, referral placed. Encouraged regular exercise, healthy diet. Exam unremarkable. Labs pending. He will return in the AM when fasting. Follow up in 1 year for CPE.

## 2017-12-19 NOTE — Addendum Note (Signed)
Addended by: Lendon Collar on: 12/19/2017 04:45 PM   Modules accepted: Orders

## 2017-12-19 NOTE — Assessment & Plan Note (Signed)
Repeat lipid panel pending. Encouraged regular exercise, healthy diet.

## 2017-12-19 NOTE — Assessment & Plan Note (Signed)
Gout attack in late October 2019 when he stopped taking his allopurinol for several days, required colchicine for a few days. Refills sent to pharmacy for both medications. Uric acid level pending.

## 2017-12-19 NOTE — Patient Instructions (Signed)
Start exercising. You should be getting 150 minutes of moderate intensity exercise weekly.  It's important to improve your diet by reducing consumption of fast food, fried food, processed snack foods, sugary drinks. Increase consumption of fresh vegetables and fruits, whole grains, water.  Ensure you are drinking 64 ounces of water daily.  You will be contacted regarding your referral to GI for the colonoscopy. Please let us know if you have not been contacted within one week.   Schedule a lab only appointment to return between 8-10 am. No food four hours prior. You may have water and black coffee.  We will see you in one year for your annual exam or sooner if needed.  It was a pleasure to see you today!   Preventive Care 40-64 Years, Male Preventive care refers to lifestyle choices and visits with your health care provider that can promote health and wellness. What does preventive care include?   A yearly physical exam. This is also called an annual well check.  Dental exams once or twice a year.  Routine eye exams. Ask your health care provider how often you should have your eyes checked.  Personal lifestyle choices, including: ? Daily care of your teeth and gums. ? Regular physical activity. ? Eating a healthy diet. ? Avoiding tobacco and drug use. ? Limiting alcohol use. ? Practicing safe sex. ? Taking low-dose aspirin every day starting at age 21. What happens during an annual well check? The services and screenings done by your health care provider during your annual well check will depend on your age, overall health, lifestyle risk factors, and family history of disease. Counseling Your health care provider may ask you questions about your:  Alcohol use.  Tobacco use.  Drug use.  Emotional well-being.  Home and relationship well-being.  Sexual activity.  Eating habits.  Work and work Statistician. Screening You may have the following tests or  measurements:  Height, weight, and BMI.  Blood pressure.  Lipid and cholesterol levels. These may be checked every 5 years, or more frequently if you are over 7 years old.  Skin check.  Lung cancer screening. You may have this screening every year starting at age 58 if you have a 30-pack-year history of smoking and currently smoke or have quit within the past 15 years.  Colorectal cancer screening. All adults should have this screening starting at age 41 and continuing until age 57. Your health care provider may recommend screening at age 84. You will have tests every 1-10 years, depending on your results and the type of screening test. People at increased risk should start screening at an earlier age. Screening tests may include: ? Guaiac-based fecal occult blood testing. ? Fecal immunochemical test (FIT). ? Stool DNA test. ? Virtual colonoscopy. ? Sigmoidoscopy. During this test, a flexible tube with a tiny camera (sigmoidoscope) is used to examine your rectum and lower colon. The sigmoidoscope is inserted through your anus into your rectum and lower colon. ? Colonoscopy. During this test, a long, thin, flexible tube with a tiny camera (colonoscope) is used to examine your entire colon and rectum.  Prostate cancer screening. Recommendations will vary depending on your family history and other risks.  Hepatitis C blood test.  Hepatitis B blood test.  Sexually transmitted disease (STD) testing.  Diabetes screening. This is done by checking your blood sugar (glucose) after you have not eaten for a while (fasting). You may have this done every 1-3 years. Discuss your test results, treatment  options, and if necessary, the need for more tests with your health care provider. Vaccines Your health care provider may recommend certain vaccines, such as:  Influenza vaccine. This is recommended every year.  Tetanus, diphtheria, and acellular pertussis (Tdap, Td) vaccine. You may need a Td  booster every 10 years.  Varicella vaccine. You may need this if you have not been vaccinated.  Zoster vaccine. You may need this after age 38.  Measles, mumps, and rubella (MMR) vaccine. You may need at least one dose of MMR if you were born in 1957 or later. You may also need a second dose.  Pneumococcal 13-valent conjugate (PCV13) vaccine. You may need this if you have certain conditions and have not been vaccinated.  Pneumococcal polysaccharide (PPSV23) vaccine. You may need one or two doses if you smoke cigarettes or if you have certain conditions.  Meningococcal vaccine. You may need this if you have certain conditions.  Hepatitis A vaccine. You may need this if you have certain conditions or if you travel or work in places where you may be exposed to hepatitis A.  Hepatitis B vaccine. You may need this if you have certain conditions or if you travel or work in places where you may be exposed to hepatitis B.  Haemophilus influenzae type b (Hib) vaccine. You may need this if you have certain risk factors. Talk to your health care provider about which screenings and vaccines you need and how often you need them. This information is not intended to replace advice given to you by your health care provider. Make sure you discuss any questions you have with your health care provider. Document Released: 01/15/2015 Document Revised: 02/08/2017 Document Reviewed: 10/20/2014 Elsevier Interactive Patient Education  2019 [employer].

## 2017-12-20 ENCOUNTER — Other Ambulatory Visit (INDEPENDENT_AMBULATORY_CARE_PROVIDER_SITE_OTHER): Payer: BLUE CROSS/BLUE SHIELD

## 2017-12-20 DIAGNOSIS — M1A9XX0 Chronic gout, unspecified, without tophus (tophi): Secondary | ICD-10-CM

## 2017-12-20 DIAGNOSIS — E782 Mixed hyperlipidemia: Secondary | ICD-10-CM

## 2017-12-20 DIAGNOSIS — Z0000 Encounter for general adult medical examination without abnormal findings: Secondary | ICD-10-CM

## 2017-12-20 DIAGNOSIS — M109 Gout, unspecified: Secondary | ICD-10-CM | POA: Diagnosis not present

## 2017-12-20 DIAGNOSIS — Z125 Encounter for screening for malignant neoplasm of prostate: Secondary | ICD-10-CM | POA: Diagnosis not present

## 2017-12-20 LAB — LIPID PANEL
Cholesterol: 205 mg/dL — ABNORMAL HIGH (ref 0–200)
HDL: 39.9 mg/dL (ref >39.00)
LDL CALC: 129 mg/dL — AB (ref 0–99)
NonHDL: 165
Total CHOL/HDL Ratio: 5
Triglycerides: 181 mg/dL — ABNORMAL HIGH (ref 0.0–149.0)
VLDL: 36.2 mg/dL (ref 0.0–40.0)

## 2017-12-20 LAB — COMPREHENSIVE METABOLIC PANEL
ALT: 23 U/L (ref 0–53)
AST: 25 U/L (ref 0–37)
Albumin: 4.4 g/dL (ref 3.5–5.2)
Alkaline Phosphatase: 44 U/L (ref 39–117)
BUN: 18 mg/dL (ref 6–23)
CO2: 31 mEq/L (ref 19–32)
Calcium: 9.4 mg/dL (ref 8.4–10.5)
Chloride: 104 mEq/L (ref 96–112)
Creatinine, Ser: 1.34 mg/dL (ref 0.40–1.50)
GFR: 59.11 mL/min — ABNORMAL LOW (ref >60.00)
Glucose, Bld: 97 mg/dL (ref 70–99)
Potassium: 5 mEq/L (ref 3.5–5.1)
Sodium: 140 mEq/L (ref 135–145)
Total Bilirubin: 1.1 mg/dL (ref 0.2–1.2)
Total Protein: 6.5 g/dL (ref 6.0–8.3)

## 2017-12-20 LAB — PSA: PSA: 0.28 ng/mL (ref 0.10–4.00)

## 2017-12-20 LAB — URIC ACID: Uric Acid, Serum: 6.7 mg/dL (ref 4.0–7.8)

## 2017-12-24 ENCOUNTER — Other Ambulatory Visit: Payer: Self-pay | Admitting: Primary Care

## 2017-12-24 DIAGNOSIS — M1A9XX0 Chronic gout, unspecified, without tophus (tophi): Secondary | ICD-10-CM

## 2017-12-24 LAB — TESTOS,TOTAL,FREE AND SHBG (FEMALE)
Free Testosterone: 65.3 pg/mL (ref 35.0–155.0)
Sex Hormone Binding: 29 nmol/L (ref 10–50)
Testosterone, Total, LC-MS-MS: 335 ng/dL (ref 250–1100)

## 2017-12-24 MED ORDER — ALLOPURINOL 100 MG PO TABS: 200.0000 mg | ORAL_TABLET | Freq: Every day | ORAL | 3 refills | Status: DC

## 2018-01-04 ENCOUNTER — Encounter: Payer: Self-pay | Admitting: *Deleted

## 2018-01-11 ENCOUNTER — Other Ambulatory Visit: Payer: Self-pay | Admitting: Primary Care

## 2018-01-18 DIAGNOSIS — Z86010 Personal history of colonic polyps: Secondary | ICD-10-CM | POA: Diagnosis not present

## 2018-02-06 DIAGNOSIS — K635 Polyp of colon: Secondary | ICD-10-CM | POA: Diagnosis not present

## 2018-02-06 DIAGNOSIS — D124 Benign neoplasm of descending colon: Secondary | ICD-10-CM | POA: Diagnosis not present

## 2018-02-06 DIAGNOSIS — Z86010 Personal history of colonic polyps: Secondary | ICD-10-CM | POA: Diagnosis not present

## 2018-02-06 DIAGNOSIS — Z1211 Encounter for screening for malignant neoplasm of colon: Secondary | ICD-10-CM | POA: Diagnosis not present

## 2018-02-07 LAB — HM COLONOSCOPY

## 2018-02-19 ENCOUNTER — Encounter: Payer: Self-pay | Admitting: Primary Care

## 2018-04-03 DIAGNOSIS — L570 Actinic keratosis: Secondary | ICD-10-CM | POA: Diagnosis not present

## 2018-04-03 DIAGNOSIS — L578 Other skin changes due to chronic exposure to nonionizing radiation: Secondary | ICD-10-CM | POA: Diagnosis not present

## 2018-04-03 DIAGNOSIS — L821 Other seborrheic keratosis: Secondary | ICD-10-CM | POA: Diagnosis not present

## 2018-04-03 DIAGNOSIS — Z1283 Encounter for screening for malignant neoplasm of skin: Secondary | ICD-10-CM | POA: Diagnosis not present

## 2018-04-29 ENCOUNTER — Other Ambulatory Visit: Payer: Self-pay

## 2018-04-29 ENCOUNTER — Ambulatory Visit (INDEPENDENT_AMBULATORY_CARE_PROVIDER_SITE_OTHER): Payer: BLUE CROSS/BLUE SHIELD | Admitting: Primary Care

## 2018-04-29 ENCOUNTER — Other Ambulatory Visit (INDEPENDENT_AMBULATORY_CARE_PROVIDER_SITE_OTHER): Payer: BLUE CROSS/BLUE SHIELD

## 2018-04-29 ENCOUNTER — Encounter: Payer: Self-pay | Admitting: Primary Care

## 2018-04-29 DIAGNOSIS — M1A9XX0 Chronic gout, unspecified, without tophus (tophi): Secondary | ICD-10-CM | POA: Diagnosis not present

## 2018-04-29 DIAGNOSIS — M109 Gout, unspecified: Secondary | ICD-10-CM

## 2018-04-29 LAB — URIC ACID: Uric Acid, Serum: 5.2 mg/dL (ref 4.0–7.8)

## 2018-04-29 NOTE — Telephone Encounter (Signed)
Last prescribed on 12/19/2017. Last office visit on 04/29/2018. No future appointment

## 2018-04-29 NOTE — Telephone Encounter (Signed)
Will you please schedule him for a virtual visit to discuss his gout? Thanks!

## 2018-04-29 NOTE — Patient Instructions (Signed)
Schedule a non fasting lab only appointment for repeat gout level check.  Continue to work on a healthy diet and regular exercise. Congratulations on your weight loss!  I'll be in touch soon. It was a pleasure to see you today!  [name], NP-C

## 2018-04-29 NOTE — Progress Notes (Signed)
Subjective:    Patient ID: Lonnie Castro, male    DOB: 1965/06/26, 53 y.o.   MRN: [id number]  HPI  Virtual Visit via Video Note  I connected with Lonnie Castro on 04/29/18 at 11:40 AM EDT by a video enabled telemedicine application and verified that I am speaking with the correct person using two identifiers.   I discussed the limitations of evaluation and management by telemedicine and the availability of in person appointments. The patient expressed understanding and agreed to proceed. He is at home, I am in the office.  History of Present Illness:  Lonnie Castro is a 53 year old male with a history of chronic gout who presents today with a chief complaint of gout flares.  He is currently managed on allopurinol 200 mg daily and colchicine 0.6 mg PRN. His last uric acid level in December 2019 was 6.7 so it was recommended we increase his allopurinol to 200 mg. We recommended he return for repeat labs four weeks later to ensure a therapeutic level of uric acid on allopurinol.   Over the last few weeks he's has some achyness in his left knee and toes. He denies erythema and redness to those sites. His discomfort is mild overall and daily. He's taking colchicine 0.6 mg nearly everyday for the last two weeks without much improvement.Marland Kitchen He is compliant to his allopurinol 200 mg daily.    Observations/Objective:  Alert and oriented. Walking around his home without difficulty. Appears well.   Assessment and Plan:  See problem based charting.  Follow Up Instructions:  Schedule a non fasting lab only appointment for repeat gout level check.  Continue to work on a healthy diet and regular exercise. Congratulations on your weight loss!  I'll be in touch soon. It was a pleasure to see you today!  Lonnie Bossier, NP-C    I discussed the assessment and treatment plan with the patient. The patient was provided an opportunity to ask questions and all were answered. The patient agreed with the plan  and demonstrated an understanding of the instructions.   The patient was advised to call back or seek an in-person evaluation if the symptoms worsen or if the condition fails to improve as anticipated.     Lonnie Koch, NP    Review of Systems  Constitutional: Negative for fever.  Musculoskeletal: Positive for arthralgias. Negative for joint swelling.  Skin: Negative for color change.       Past Medical History:  Diagnosis Date  . Allergic rhinitis   . Arthritis   . Gout   . Orchitis      Social History   Socioeconomic History  . Marital status: Married    Spouse name: Not on file  . Number of children: Not on file  . Years of education: Not on file  . Highest education level: Not on file  Occupational History  . Not on file  Social Needs  . Financial resource strain: Not on file  . Food insecurity:    Worry: Not on file    Inability: Not on file  . Transportation needs:    Medical: Not on file    Non-medical: Not on file  Tobacco Use  . Smoking status: Never Smoker  . Smokeless tobacco: Never Used  Substance and Sexual Activity  . Alcohol use: Yes    Alcohol/week: 4.0 standard drinks    Types: 4 Standard drinks or equivalent per week  . Drug use: No  . Sexual activity: Not  on file  Lifestyle  . Physical activity:    Days per week: Not on file    Minutes per session: Not on file  . Stress: Not on file  Relationships  . Social connections:    Talks on phone: Not on file    Gets together: Not on file    Attends religious service: Not on file    Active member of club or organization: Not on file    Attends meetings of clubs or organizations: Not on file    Relationship status: Not on file  . Intimate partner violence:    Fear of current or ex partner: Not on file    Emotionally abused: Not on file    Physically abused: Not on file    Forced sexual activity: Not on file  Other Topics Concern  . Not on file  Social History Narrative   Married.    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   Biomedical engineer.   Enjoys exercising, relaxing, flying, snowboarding.    Past Surgical History:  Procedure Laterality Date  . COLONOSCOPY WITH PROPOFOL N/A 11/03/2014   Procedure: COLONOSCOPY WITH PROPOFOL;  Surgeon: Lollie Sails, MD;  Location: [facility] ENDOSCOPY;  Service: Endoscopy;  Laterality: N/A;  . HERNIA REPAIR  1984  . NASAL SEPTUM SURGERY  1987    Family History  Problem Relation Age of Onset  . Cancer Father     No Known Allergies  Current Outpatient Medications on File Prior to Visit  Medication Sig Dispense Refill  . allopurinol (ZYLOPRIM) 100 MG tablet Take 2 tablets (200 mg total) by mouth daily. For gout prevention. 180 tablet 3  . colchicine 0.6 MG tablet Take two tablets at flare onset. Repeat with one tablet one hour later. Subsequent days take 1 tablet once daily until flare resolves. 30 tablet 0  . fluticasone (FLONASE) 50 MCG/ACT nasal spray SPRAY 2 SPRAYS INTO EACH NOSTRIL EVERY DAY 48 g 2   No current facility-administered medications on file prior to visit.     There were no vitals taken for this visit.   Objective:   Physical Exam  Constitutional: He is oriented to person, place, and time. He appears well-nourished.  Respiratory: Effort normal.  Musculoskeletal: Normal range of motion.  Neurological: He is alert and oriented to person, place, and time.  Skin: No erythema.  Psychiatric: He has a normal mood and affect.           Assessment & Plan:

## 2018-04-29 NOTE — Assessment & Plan Note (Signed)
Daily symptoms without attack over the last 3 weeks, is compliant to his allopurinol 200 mg. Start by rechecking uric acid levels as he may not be in a therapeutic range.   Consider increasing allopurinol to 300 mg daily if needed. Await labs.

## 2018-04-29 NOTE — Telephone Encounter (Signed)
Appointment 4/27 Pt aware

## 2018-04-30 MED ORDER — COLCHICINE 0.6 MG PO TABS: ORAL_TABLET | ORAL | 0 refills | Status: DC

## 2018-04-30 NOTE — Telephone Encounter (Signed)
Noted, refill sent to pharmacy. 

## 2018-09-22 ENCOUNTER — Other Ambulatory Visit: Payer: Self-pay | Admitting: Primary Care

## 2018-10-16 ENCOUNTER — Other Ambulatory Visit: Payer: Self-pay

## 2018-10-16 DIAGNOSIS — Z20828 Contact with and (suspected) exposure to other viral communicable diseases: Secondary | ICD-10-CM | POA: Diagnosis not present

## 2018-10-16 DIAGNOSIS — Z20822 Contact with and (suspected) exposure to covid-19: Secondary | ICD-10-CM

## 2018-10-17 LAB — NOVEL CORONAVIRUS, NAA: SARS-CoV-2, NAA: NOT DETECTED

## 2018-11-13 DIAGNOSIS — M531 Cervicobrachial syndrome: Secondary | ICD-10-CM | POA: Diagnosis not present

## 2018-11-13 DIAGNOSIS — M5386 Other specified dorsopathies, lumbar region: Secondary | ICD-10-CM | POA: Diagnosis not present

## 2018-11-13 DIAGNOSIS — M7531 Calcific tendinitis of right shoulder: Secondary | ICD-10-CM | POA: Diagnosis not present

## 2018-11-13 DIAGNOSIS — M9902 Segmental and somatic dysfunction of thoracic region: Secondary | ICD-10-CM | POA: Diagnosis not present

## 2018-12-01 ENCOUNTER — Other Ambulatory Visit: Payer: Self-pay

## 2018-12-01 DIAGNOSIS — M1A9XX0 Chronic gout, unspecified, without tophus (tophi): Secondary | ICD-10-CM

## 2018-12-02 MED ORDER — COLCHICINE 0.6 MG PO TABS: ORAL_TABLET | ORAL | 0 refills | Status: DC

## 2018-12-04 DIAGNOSIS — M1A9XX0 Chronic gout, unspecified, without tophus (tophi): Secondary | ICD-10-CM

## 2018-12-04 MED ORDER — COLCHICINE 0.6 MG PO TABS: ORAL_TABLET | ORAL | 0 refills | Status: DC

## 2018-12-04 NOTE — Telephone Encounter (Signed)
Left patient a voicemail to call the office to schedule physical.

## 2018-12-11 ENCOUNTER — Encounter: Payer: Self-pay | Admitting: Primary Care

## 2018-12-11 ENCOUNTER — Other Ambulatory Visit: Payer: Self-pay | Admitting: Primary Care

## 2018-12-11 DIAGNOSIS — Z125 Encounter for screening for malignant neoplasm of prostate: Secondary | ICD-10-CM

## 2018-12-11 DIAGNOSIS — M109 Gout, unspecified: Secondary | ICD-10-CM

## 2018-12-11 DIAGNOSIS — E782 Mixed hyperlipidemia: Secondary | ICD-10-CM

## 2018-12-11 DIAGNOSIS — Z0000 Encounter for general adult medical examination without abnormal findings: Secondary | ICD-10-CM

## 2018-12-13 ENCOUNTER — Other Ambulatory Visit: Payer: Self-pay

## 2018-12-13 ENCOUNTER — Encounter: Payer: Self-pay | Admitting: Primary Care

## 2018-12-13 ENCOUNTER — Ambulatory Visit (INDEPENDENT_AMBULATORY_CARE_PROVIDER_SITE_OTHER): Payer: BLUE CROSS/BLUE SHIELD | Admitting: Primary Care

## 2018-12-13 ENCOUNTER — Ambulatory Visit (INDEPENDENT_AMBULATORY_CARE_PROVIDER_SITE_OTHER)
Admission: RE | Admit: 2018-12-13 | Discharge: 2018-12-13 | Disposition: A | Discharge status: Home / Self Care | Payer: BLUE CROSS/BLUE SHIELD | Source: Ambulatory Visit | Attending: Primary Care | Admitting: Primary Care

## 2018-12-13 VITALS — BP 120/70 | HR 61 | Temp 95.5°F | Ht 70.0 in | Wt 216.0 lb

## 2018-12-13 DIAGNOSIS — M79672 Pain in left foot: Secondary | ICD-10-CM | POA: Diagnosis not present

## 2018-12-13 DIAGNOSIS — M109 Gout, unspecified: Secondary | ICD-10-CM

## 2018-12-13 DIAGNOSIS — M7989 Other specified soft tissue disorders: Secondary | ICD-10-CM | POA: Diagnosis not present

## 2018-12-13 LAB — URIC ACID: Uric Acid, Serum: 5.9 mg/dL (ref 4.0–7.8)

## 2018-12-13 NOTE — Assessment & Plan Note (Addendum)
Symptoms today less suspicious for acute gout flare given no relief with Colchicine and location of pain.   Uric acid level pending today. Check L foot x-ray to rule out acute injury.   Continue daily Allopurinol.   He will update via My Chart if no improvement in symptoms. Otherwise, follow-up at next scheduled visit.   Agree with plan, [name], NP

## 2018-12-13 NOTE — Patient Instructions (Addendum)
Stop by the lab and x-ray prior to leaving today. I will notify you of your results once received.   Do not begin any rigorous exercise until your symptoms resolve.   Please update me via My Chart if your symptoms worsen or do not improve. Otherwise, we will follow-up at your next scheduled appointment.   It was a pleasure to see you today!

## 2018-12-13 NOTE — Progress Notes (Signed)
Subjective:    Patient ID: Lonnie Castro, male    DOB: [birth year], 53 y.o.   MRN: [id number]  HPI  Lonnie Castro is a 53 year old male with a history of gout, hyperlipidemia who presents today with a chief complaint of foot pain.  His pain is located to the left lateral foot below the malleolus which he first noticed two days ago. This pain felt similar to his prior gout pain, also felt like bruising. Pain was worse yesterday, especially with walking. He's been compliant to his allpurinol daily, used colchicine two days ago without improvement.  The day of his symptoms he walked four miles, participated in [place], and then went to the gym. Today his pain has slightly improved. He denies trauma/injury, changes in shoes.   Review of Systems  Constitutional: Negative for fever.  Musculoskeletal: Positive for arthralgias.  Skin: Negative for color change.       Left lateral foot swelling       Past Medical History:  Diagnosis Date  . Allergic rhinitis   . Arthritis   . Gout   . Orchitis      Social History   Socioeconomic History  . Marital status: Married    Spouse name: Not on file  . Number of children: Not on file  . Years of education: Not on file  . Highest education level: Not on file  Occupational History  . Not on file  Tobacco Use  . Smoking status: Never Smoker  . Smokeless tobacco: Never Used  Substance and Sexual Activity  . Alcohol use: Yes    Alcohol/week: 4.0 standard drinks    Types: 4 Standard drinks or equivalent per week  . Drug use: No  . Sexual activity: Not on file  Other Topics Concern  . Not on file  Social History Narrative   Married.   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   Biomedical engineer.   Enjoys exercising, relaxing, flying, snowboarding.   Social Determinants of Health   Financial Resource Strain:   . Difficulty of Paying Living Expenses: Not on file  Food Insecurity:   . Worried About Charity fundraiser in  the Last Year: Not on file  . Ran Out of Food in the Last Year: Not on file  Transportation Needs:   . Lack of Transportation (Medical): Not on file  . Lack of Transportation (Non-Medical): Not on file  Physical Activity:   . Days of Exercise per Week: Not on file  . Minutes of Exercise per Session: Not on file  Stress:   . Feeling of Stress : Not on file  Social Connections:   . Frequency of Communication with Friends and Family: Not on file  . Frequency of Social Gatherings with Friends and Family: Not on file  . Attends Religious Services: Not on file  . Active Member of Clubs or Organizations: Not on file  . Attends Archivist Meetings: Not on file  . Marital Status: Not on file  Intimate Partner Violence:   . Fear of Current or Ex-Partner: Not on file  . Emotionally Abused: Not on file  . Physically Abused: Not on file  . Sexually Abused: Not on file    Past Surgical History:  Procedure Laterality Date  . COLONOSCOPY WITH PROPOFOL N/A 11/03/2014   Procedure: COLONOSCOPY WITH PROPOFOL;  Surgeon: Lollie Sails, MD;  Location: [facility] ENDOSCOPY;  Service: Endoscopy;  Laterality: N/A;  . HERNIA REPAIR  1984  . NASAL SEPTUM SURGERY  1987    Family History  Problem Relation Age of Onset  . Cancer Father     No Known Allergies  Current Outpatient Medications on File Prior to Visit  Medication Sig Dispense Refill  . allopurinol (ZYLOPRIM) 100 MG tablet Take 2 tablets (200 mg total) by mouth daily. For gout prevention. 180 tablet 3  . colchicine 0.6 MG tablet Take two tablets at flare onset. Repeat with one tablet one hour later. Subsequent days take 1 tablet once daily until flare resolves. 30 tablet 0  . fluticasone (FLONASE) 50 MCG/ACT nasal spray SPRAY 2 SPRAYS INTO EACH NOSTRIL EVERY DAY 48 mL 2   No current facility-administered medications on file prior to visit.    BP 120/70   Pulse 61   Temp (!) 95.5 F (35.3 C) (Temporal)   Ht 5\' 10"  (1.778 m)   Wt  216 lb (98 kg)   SpO2 98%   BMI 30.99 kg/m    Objective:   Physical Exam  Cardiovascular:  Pulses:      Dorsalis pedis pulses are 2+ on the left side.       Posterior tibial pulses are 2+ on the left side.  Musculoskeletal:     Left foot: Normal range of motion. No tenderness or bony tenderness.       Feet:     Comments: Very slight swelling to left lateral foot, dorsally   Skin: Skin is warm and dry. No erythema.           Assessment & Plan:

## 2018-12-13 NOTE — Progress Notes (Signed)
   Subjective:    Patient ID: Lonnie Castro, male    DOB: Jun 20, 1965, 53 y.o.   MRN: [id number]  HPI  Mr. Schwinn is a 53 year old male with a history of gout, hyperlipidemia, who presents today with a chief complaint of L foot pain.  He first noticed a mild pain to his left lateral foot on 12/9. It felt similar to his previous gout pain, but states it also felt "bruised." He proceeded with his regular exercise routine and walk four miles, went to the gym and participated in [place] on Wednesday, 12/9. He has not exercised since.  The pain worsened on 12/10, though he noticed it mostly with walking, he did . He rates it as a 7/10. He took two Colchicine the morning of 12/10, and two additional tablets that night along with ibuprofen, with no improvement in his symptoms. He reports adherence to his daily allopurinol.  He states he took a shower this morning, and afterwards his pain was somewhat improved, 5/10. He denies trauma, injury, fevers, change in footwear or activity. He did notice a slight limp and he does think his left lateral foot is swollen and a little red. He has not taken a dose of Colchicine today.   Review of Systems  Constitutional: Negative for activity change and fever.  Musculoskeletal: Positive for gait problem (reports slight limping with L foot pain).       L lateral foot pain   Skin:       Slight erythema to dorsum of L foot  Neurological: Negative for weakness and numbness.       Objective:   Physical Exam Cardiovascular:     Rate and Rhythm: Normal rate and regular rhythm.     Pulses:          Dorsalis pedis pulses are 2+ on the left side.  Pulmonary:     Effort: Pulmonary effort is normal.     Breath sounds: Normal breath sounds.  Musculoskeletal:        General: No deformity or signs of injury. Normal range of motion.     Left foot: Normal range of motion.  Feet:     Left foot:     Skin integrity: Erythema (mild to dorsum of L foot, slight edema to  L lateral foot, non-tender) present. No warmth.  Skin:    Findings: Erythema (mild to dorsum of L foot) present.  Neurological:     Mental Status: He is alert and oriented to person, place, and time.           Assessment & Plan:

## 2018-12-13 NOTE — Assessment & Plan Note (Addendum)
Symptoms today less suspicious for acute gout flare given no relief with Colchicine, location of pain.   Uric acid level pending today. Check L foot x-ray to rule out acute injury.   Continue daily Allopurinol.   He will update via My Chart if no improvement in symptoms. Otherwise, follow-up at next scheduled visit.   Agree with plan, [name], NP

## 2018-12-17 ENCOUNTER — Other Ambulatory Visit: Payer: Self-pay

## 2018-12-17 ENCOUNTER — Other Ambulatory Visit (INDEPENDENT_AMBULATORY_CARE_PROVIDER_SITE_OTHER): Payer: BLUE CROSS/BLUE SHIELD

## 2018-12-17 DIAGNOSIS — M109 Gout, unspecified: Secondary | ICD-10-CM

## 2018-12-17 DIAGNOSIS — Z125 Encounter for screening for malignant neoplasm of prostate: Secondary | ICD-10-CM | POA: Diagnosis not present

## 2018-12-17 DIAGNOSIS — E782 Mixed hyperlipidemia: Secondary | ICD-10-CM | POA: Diagnosis not present

## 2018-12-17 LAB — COMPREHENSIVE METABOLIC PANEL
ALT: 18 U/L (ref 0–53)
AST: 17 U/L (ref 0–37)
Albumin: 4.2 g/dL (ref 3.5–5.2)
Alkaline Phosphatase: 49 U/L (ref 39–117)
BUN: 14 mg/dL (ref 6–23)
CO2: 31 mEq/L (ref 19–32)
Calcium: 9 mg/dL (ref 8.4–10.5)
Chloride: 103 mEq/L (ref 96–112)
Creatinine, Ser: 1.33 mg/dL (ref 0.40–1.50)
GFR: 55.89 mL/min — ABNORMAL LOW (ref >60.00)
Glucose, Bld: 101 mg/dL — ABNORMAL HIGH (ref 70–99)
Potassium: 4.4 mEq/L (ref 3.5–5.1)
Sodium: 139 mEq/L (ref 135–145)
Total Bilirubin: 0.7 mg/dL (ref 0.2–1.2)
Total Protein: 6 g/dL (ref 6.0–8.3)

## 2018-12-17 LAB — LIPID PANEL
Cholesterol: 165 mg/dL (ref 0–200)
HDL: 40.3 mg/dL (ref >39.00)
LDL Cholesterol: 111 mg/dL — ABNORMAL HIGH (ref 0–99)
NonHDL: 124.59
Total CHOL/HDL Ratio: 4
Triglycerides: 70 mg/dL (ref 0.0–149.0)
VLDL: 14 mg/dL (ref 0.0–40.0)

## 2018-12-17 LAB — CBC
HCT: 46.2 % (ref 39.0–52.0)
Hemoglobin: 15.7 g/dL (ref 13.0–17.0)
MCHC: 34.1 g/dL (ref 30.0–36.0)
MCV: 94.5 fl (ref 78.0–100.0)
Platelets: 161 10*3/uL (ref 150.0–400.0)
RBC: 4.88 Mil/uL (ref 4.22–5.81)
RDW: 12.5 % (ref 11.5–15.5)
WBC: 4.1 10*3/uL (ref 4.0–10.5)

## 2018-12-17 LAB — URIC ACID: Uric Acid, Serum: 6.2 mg/dL (ref 4.0–7.8)

## 2018-12-17 LAB — PSA: PSA: 0.28 ng/mL (ref 0.10–4.00)

## 2018-12-24 ENCOUNTER — Encounter: Payer: Self-pay | Admitting: Primary Care

## 2018-12-24 ENCOUNTER — Ambulatory Visit (INDEPENDENT_AMBULATORY_CARE_PROVIDER_SITE_OTHER): Payer: BLUE CROSS/BLUE SHIELD | Admitting: Primary Care

## 2018-12-24 ENCOUNTER — Other Ambulatory Visit: Payer: Self-pay

## 2018-12-24 VITALS — BP 124/70 | HR 67 | Temp 96.1°F | Ht 70.0 in | Wt 217.2 lb

## 2018-12-24 DIAGNOSIS — Z23 Encounter for immunization: Secondary | ICD-10-CM

## 2018-12-24 DIAGNOSIS — M79672 Pain in left foot: Secondary | ICD-10-CM

## 2018-12-24 DIAGNOSIS — J3089 Other allergic rhinitis: Secondary | ICD-10-CM | POA: Diagnosis not present

## 2018-12-24 DIAGNOSIS — M109 Gout, unspecified: Secondary | ICD-10-CM

## 2018-12-24 DIAGNOSIS — Z0000 Encounter for general adult medical examination without abnormal findings: Secondary | ICD-10-CM | POA: Diagnosis not present

## 2018-12-24 DIAGNOSIS — E782 Mixed hyperlipidemia: Secondary | ICD-10-CM | POA: Diagnosis not present

## 2018-12-24 MED ORDER — ALLOPURINOL 300 MG PO TABS: 300.0000 mg | ORAL_TABLET | Freq: Every day | ORAL | 0 refills | Status: DC

## 2018-12-24 NOTE — Patient Instructions (Signed)
We've increased your allopurinol to 300 mg for gout prevention, take 1 tablet once daily.  Continue exercising. You should be getting 150 minutes of moderate intensity exercise weekly.  Continue to work on a healthy diet. Ensure you are consuming 64 ounces of water daily.  Schedule a nurse visit to return in 2-6 months for the second Shingrix vaccination.  Please schedule a lab only appointment in 1 month for gout and kidney check.  It was a pleasure to see you today!   Preventive Care 14-23 Years Old, Male Preventive care refers to lifestyle choices and visits with your health care provider that can promote health and wellness. This includes:  A yearly physical exam. This is also called an annual well check.  Regular dental and eye exams.  Immunizations.  Screening for certain conditions.  Healthy lifestyle choices, such as eating a healthy diet, getting regular exercise, not using drugs or products that contain nicotine and tobacco, and limiting alcohol use. What can I expect for my preventive care visit? Physical exam Your health care provider will check:  Height and weight. These may be used to calculate body mass index (BMI), which is a measurement that tells if you are at a healthy weight.  Heart rate and blood pressure.  Your skin for abnormal spots. Counseling Your health care provider may ask you questions about:  Alcohol, tobacco, and drug use.  Emotional well-being.  Home and relationship well-being.  Sexual activity.  Eating habits.  Work and work Statistician. What immunizations do I need?  Influenza (flu) vaccine  This is recommended every year. Tetanus, diphtheria, and pertussis (Tdap) vaccine  You may need a Td booster every 10 years. Varicella (chickenpox) vaccine  You may need this vaccine if you have not already been vaccinated. Zoster (shingles) vaccine  You may need this after age 36. Measles, mumps, and rubella (MMR) vaccine  You  may need at least one dose of MMR if you were born in 1957 or later. You may also need a second dose. Pneumococcal conjugate (PCV13) vaccine  You may need this if you have certain conditions and were not previously vaccinated. Pneumococcal polysaccharide (PPSV23) vaccine  You may need one or two doses if you smoke cigarettes or if you have certain conditions. Meningococcal conjugate (MenACWY) vaccine  You may need this if you have certain conditions. Hepatitis A vaccine  You may need this if you have certain conditions or if you travel or work in places where you may be exposed to hepatitis A. Hepatitis B vaccine  You may need this if you have certain conditions or if you travel or work in places where you may be exposed to hepatitis B. Haemophilus influenzae type b (Hib) vaccine  You may need this if you have certain risk factors. Human papillomavirus (HPV) vaccine  If recommended by your health care provider, you may need three doses over 6 months. You may receive vaccines as individual doses or as more than one vaccine together in one shot (combination vaccines). Talk with your health care provider about the risks and benefits of combination vaccines. What tests do I need? Blood tests  Lipid and cholesterol levels. These may be checked every 5 years, or more frequently if you are over 40 years old.  Hepatitis C test.  Hepatitis B test. Screening  Lung cancer screening. You may have this screening every year starting at age 40 if you have a 30-pack-year history of smoking and currently smoke or have quit within the  past 15 years.  Prostate cancer screening. Recommendations will vary depending on your family history and other risks.  Colorectal cancer screening. All adults should have this screening starting at age 10 and continuing until age 63. Your health care provider may recommend screening at age 66 if you are at increased risk. You will have tests every 1-10 years,  depending on your results and the type of screening test.  Diabetes screening. This is done by checking your blood sugar (glucose) after you have not eaten for a while (fasting). You may have this done every 1-3 years.  Sexually transmitted disease (STD) testing. Follow these instructions at home: Eating and drinking  Eat a diet that includes fresh fruits and vegetables, whole grains, lean protein, and low-fat dairy products.  Take vitamin and mineral supplements as recommended by your health care provider.  Do not drink alcohol if your health care provider tells you not to drink.  If you drink alcohol: ? Limit how much you have to 0-2 drinks a day. ? Be aware of how much alcohol is in your drink. In the [place] one drink equals one 12 oz bottle of beer (355 mL), one 5 oz glass of wine (148 mL), or one 1 oz glass of hard liquor (44 mL). Lifestyle  Take daily care of your teeth and gums.  Stay active. Exercise for at least 30 minutes on 5 or more days each week.  Do not use any products that contain nicotine or tobacco, such as cigarettes, e-cigarettes, and chewing tobacco. If you need help quitting, ask your health care provider.  If you are sexually active, practice safe sex. Use a condom or other form of protection to prevent STIs (sexually transmitted infections).  Talk with your health care provider about taking a low-dose aspirin every day starting at age 61. What's next?  Go to your health care provider once a year for a well check visit.  Ask your health care provider how often you should have your eyes and teeth checked.  Stay up to date on all vaccines. This information is not intended to replace advice given to you by your health care provider. Make sure you discuss any questions you have with your health care provider. Document Released: 01/15/2015 Document Revised: 12/13/2017 Document Reviewed: 12/13/2017 Elsevier Patient Education  2020 [employer].

## 2018-12-24 NOTE — Assessment & Plan Note (Signed)
Influenza and Shingrix due today. Tetanus UTD. PSA UTD. Colonoscopy UTD, due in 2023. Commended him on regular exercise and healthy diet.  Exam today unremarkable.  Labs reviewed.

## 2018-12-24 NOTE — Addendum Note (Signed)
Addended by: Jacqualin Combes on: 12/24/2018 02:56 PM   Modules accepted: Orders

## 2018-12-24 NOTE — Progress Notes (Signed)
Subjective:    Patient ID: Lonnie Castro, male    DOB: 12/18/1965, 53 y.o.   MRN: [id number]  HPI  Lonnie Castro is a 53 year old male who presents today for complete physical.  Immunizations: -Tetanus: Completed in 2016 -Influenza: Due -Shingles: Never completed. Due today   Diet: He endorses a healthy diet. Exercise: Exercises regularly   Eye exam: No exam in 2020 Dental exam: Completes semi-annually   Colonoscopy: Completed in 2020, due in 2023 PSA: 0.28  BP Readings from Last 3 Encounters:  12/24/18 124/70  12/13/18 120/70  12/19/17 124/82   Wt Readings from Last 3 Encounters:  12/24/18 217 lb 4 oz (98.5 kg)  12/13/18 216 lb (98 kg)  12/19/17 239 lb 8 oz (108.6 kg)   The 10-year ASCVD risk score Lonnie Castro) is: 4.7%   Values used to calculate the score:     Age: 35 years     Sex: Male     Is Non-Hispanic African American: No     Diabetic: No     Tobacco smoker: No     Systolic Blood Pressure: [id number] mmHg     Is BP treated: No     HDL Cholesterol: 40.3 mg/dL     Total Cholesterol: 165 mg/dL   Review of Systems  Constitutional: Negative for unexpected weight change.  HENT: Negative for rhinorrhea.   Respiratory: Negative for cough and shortness of breath.   Cardiovascular: Negative for chest pain.  Gastrointestinal: Negative for constipation and diarrhea.  Genitourinary: Negative for difficulty urinating.  Musculoskeletal: Positive for arthralgias.       Increased arthralgias with minor erythema to left metatarsal joint of first digit.  Skin: Negative for color change and rash.  Allergic/Immunologic: Negative for environmental allergies.  Neurological: Negative for dizziness, numbness and headaches.  Psychiatric/Behavioral: The patient is not nervous/anxious.        Past Medical History:  Diagnosis Date  . Allergic rhinitis   . Arthritis   . Gout   . Orchitis      Social History   Socioeconomic History  . Marital status: Married   Spouse name: Not on file  . Number of children: Not on file  . Years of education: Not on file  . Highest education level: Not on file  Occupational History  . Not on file  Tobacco Use  . Smoking status: Never Smoker  . Smokeless tobacco: Never Used  Substance and Sexual Activity  . Alcohol use: Yes    Alcohol/week: 4.0 standard drinks    Types: 4 Standard drinks or equivalent per week  . Drug use: No  . Sexual activity: Not on file  Other Topics Concern  . Not on file  Social History Narrative   Married.   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   Biomedical engineer.   Enjoys exercising, relaxing, flying, snowboarding.   Social Determinants of Health   Financial Resource Strain:   . Difficulty of Paying Living Expenses: Not on file  Food Insecurity:   . Worried About Charity fundraiser in the Last Year: Not on file  . Ran Out of Food in the Last Year: Not on file  Transportation Needs:   . Lack of Transportation (Medical): Not on file  . Lack of Transportation (Non-Medical): Not on file  Physical Activity:   . Days of Exercise per Week: Not on file  . Minutes of Exercise per Session: Not on file  Stress:   .  Feeling of Stress : Not on file  Social Connections:   . Frequency of Communication with Friends and Family: Not on file  . Frequency of Social Gatherings with Friends and Family: Not on file  . Attends Religious Services: Not on file  . Active Member of Clubs or Organizations: Not on file  . Attends Archivist Meetings: Not on file  . Marital Status: Not on file  Intimate Partner Violence:   . Fear of Current or Ex-Partner: Not on file  . Emotionally Abused: Not on file  . Physically Abused: Not on file  . Sexually Abused: Not on file    Past Surgical History:  Procedure Laterality Date  . COLONOSCOPY WITH PROPOFOL N/A 11/03/2014   Procedure: COLONOSCOPY WITH PROPOFOL;  Surgeon: Lonnie Sails, MD;  Location: [facility] ENDOSCOPY;   Service: Endoscopy;  Laterality: N/A;  . HERNIA REPAIR  1984  . NASAL SEPTUM SURGERY  1987    Family History  Problem Relation Age of Onset  . Cancer Father     No Known Allergies  Current Outpatient Medications on File Prior to Visit  Medication Sig Dispense Refill  . colchicine 0.6 MG tablet Take two tablets at flare onset. Repeat with one tablet one hour later. Subsequent days take 1 tablet once daily until flare resolves. 30 tablet 0  . fluticasone (FLONASE) 50 MCG/ACT nasal spray SPRAY 2 SPRAYS INTO EACH NOSTRIL EVERY DAY 48 mL 2  . [DISCONTINUED] allopurinol (ZYLOPRIM) 100 MG tablet Take 2 tablets (200 mg total) by mouth daily. For gout prevention. 180 tablet 3   No current facility-administered medications on file prior to visit.    BP 124/70   Pulse 67   Temp (!) 96.1 F (35.6 C) (Temporal)   Ht 5\' 10"  (1.778 m)   Wt 217 lb 4 oz (98.5 kg)   SpO2 97%   BMI 31.17 kg/m    Objective:   Physical Exam  Constitutional: He is oriented to person, place, and time. He appears well-nourished.  HENT:  Right Ear: Tympanic membrane and ear canal normal.  Left Ear: Tympanic membrane and ear canal normal.  Mouth/Throat: Oropharynx is clear and moist.  Eyes: Pupils are equal, round, and reactive to light. EOM are normal.  Cardiovascular: Normal rate and regular rhythm.  Respiratory: Effort normal and breath sounds normal.  GI: Soft. Bowel sounds are normal. There is no abdominal tenderness.  Musculoskeletal:        General: Normal range of motion.     Cervical back: Neck supple.  Neurological: He is alert and oriented to person, place, and time. No cranial nerve deficit.  Reflex Scores:      Patellar reflexes are 2+ on the right side and 2+ on the left side. Skin: Skin is warm and dry.  Slight erythema to left first metatarsal joint.  Psychiatric: He has a normal mood and affect.           Assessment & Plan:

## 2018-12-24 NOTE — Assessment & Plan Note (Signed)
Resolved

## 2018-12-24 NOTE — Assessment & Plan Note (Signed)
Intermittent, doing well with Flonase.

## 2018-12-24 NOTE — Assessment & Plan Note (Addendum)
Recent increase in symptoms, recent Uric acid level of 6.2. Increase allopurinol to 300 mg daily. He will update in a few weeks. Repeat uric acid level in one month.

## 2018-12-24 NOTE — Assessment & Plan Note (Signed)
Improved on recent labs.  Commended on weight loss through healthy diet and exercise. Continue to monitor.

## 2019-01-16 ENCOUNTER — Other Ambulatory Visit: Payer: Self-pay | Admitting: Primary Care

## 2019-01-16 DIAGNOSIS — M109 Gout, unspecified: Secondary | ICD-10-CM

## 2019-01-24 ENCOUNTER — Other Ambulatory Visit (INDEPENDENT_AMBULATORY_CARE_PROVIDER_SITE_OTHER): Payer: BLUE CROSS/BLUE SHIELD

## 2019-01-24 ENCOUNTER — Other Ambulatory Visit: Payer: Self-pay

## 2019-01-24 DIAGNOSIS — M109 Gout, unspecified: Secondary | ICD-10-CM | POA: Diagnosis not present

## 2019-01-24 LAB — URIC ACID: Uric Acid, Serum: 5.4 mg/dL (ref 4.0–7.8)

## 2019-01-26 ENCOUNTER — Other Ambulatory Visit: Payer: Self-pay | Admitting: Primary Care

## 2019-01-26 DIAGNOSIS — N289 Disorder of kidney and ureter, unspecified: Secondary | ICD-10-CM

## 2019-02-23 ENCOUNTER — Other Ambulatory Visit: Payer: Self-pay | Admitting: Primary Care

## 2019-02-23 DIAGNOSIS — M1A9XX0 Chronic gout, unspecified, without tophus (tophi): Secondary | ICD-10-CM

## 2019-02-26 ENCOUNTER — Ambulatory Visit (INDEPENDENT_AMBULATORY_CARE_PROVIDER_SITE_OTHER): Payer: BLUE CROSS/BLUE SHIELD

## 2019-02-26 ENCOUNTER — Other Ambulatory Visit (INDEPENDENT_AMBULATORY_CARE_PROVIDER_SITE_OTHER): Payer: BLUE CROSS/BLUE SHIELD

## 2019-02-26 ENCOUNTER — Other Ambulatory Visit: Payer: Self-pay

## 2019-02-26 DIAGNOSIS — N289 Disorder of kidney and ureter, unspecified: Secondary | ICD-10-CM

## 2019-02-26 DIAGNOSIS — Z23 Encounter for immunization: Secondary | ICD-10-CM

## 2019-02-27 LAB — BASIC METABOLIC PANEL
BUN: 17 mg/dL (ref 6–23)
CO2: 28 mEq/L (ref 19–32)
Calcium: 9.3 mg/dL (ref 8.4–10.5)
Chloride: 101 mEq/L (ref 96–112)
Creatinine, Ser: 1.44 mg/dL (ref 0.40–1.50)
GFR: 50.96 mL/min — ABNORMAL LOW (ref >60.00)
Glucose, Bld: 93 mg/dL (ref 70–99)
Potassium: 4.7 mEq/L (ref 3.5–5.1)
Sodium: 138 mEq/L (ref 135–145)

## 2019-03-02 DIAGNOSIS — M109 Gout, unspecified: Secondary | ICD-10-CM

## 2019-03-03 NOTE — Telephone Encounter (Signed)
Could take a look at this patient's lab results?

## 2019-03-11 MED ORDER — FEBUXOSTAT 40 MG PO TABS: 40.0000 mg | ORAL_TABLET | Freq: Every day | ORAL | 0 refills | Status: DC

## 2019-03-27 DIAGNOSIS — M109 Gout, unspecified: Secondary | ICD-10-CM

## 2019-03-27 DIAGNOSIS — N289 Disorder of kidney and ureter, unspecified: Secondary | ICD-10-CM

## 2019-03-28 ENCOUNTER — Other Ambulatory Visit: Payer: Self-pay

## 2019-03-28 ENCOUNTER — Other Ambulatory Visit (INDEPENDENT_AMBULATORY_CARE_PROVIDER_SITE_OTHER): Payer: BLUE CROSS/BLUE SHIELD

## 2019-03-28 DIAGNOSIS — N289 Disorder of kidney and ureter, unspecified: Secondary | ICD-10-CM

## 2019-03-28 DIAGNOSIS — M109 Gout, unspecified: Secondary | ICD-10-CM

## 2019-03-29 LAB — BASIC METABOLIC PANEL
BUN: 23 mg/dL (ref 7–25)
CO2: 26 mmol/L (ref 20–32)
Calcium: 9.3 mg/dL (ref 8.6–10.3)
Chloride: 103 mmol/L (ref 98–110)
Creat: 1.2 mg/dL (ref 0.70–1.33)
Glucose, Bld: 96 mg/dL (ref 65–99)
Potassium: 4.2 mmol/L (ref 3.5–5.3)
Sodium: 138 mmol/L (ref 135–146)

## 2019-03-29 LAB — URIC ACID: Uric Acid, Serum: 7.1 mg/dL (ref 4.0–8.0)

## 2019-04-11 ENCOUNTER — Ambulatory Visit: Payer: BLUE CROSS/BLUE SHIELD

## 2019-04-28 ENCOUNTER — Ambulatory Visit: Payer: BLUE CROSS/BLUE SHIELD | Attending: Internal Medicine

## 2019-04-28 DIAGNOSIS — Z23 Encounter for immunization: Secondary | ICD-10-CM

## 2019-04-28 NOTE — Progress Notes (Signed)
   Covid-19 Vaccination Clinic  Name:  Lonnie Castro    MRN: [id number] DOB: 02-Sep-1965  04/28/2019  [name] was observed post Covid-19 immunization for 15 minutes without incident. He was provided with Vaccine Information Sheet and instruction to access the V-Safe system.   [name] was instructed to call 911 with any severe reactions post vaccine: Marland Kitchen Difficulty breathing  . Swelling of face and throat  . A fast heartbeat  . A bad rash all over body  . Dizziness and weakness   Immunizations Administered    Name Date Dose VIS Date Route   Pfizer COVID-19 Vaccine 04/28/2019 11:46 AM 0.3 mL 02/26/2018 Intramuscular   Manufacturer: [place]   Lot: [id number]   [place]: [id number]

## 2019-05-20 ENCOUNTER — Ambulatory Visit: Payer: BLUE CROSS/BLUE SHIELD | Attending: Internal Medicine

## 2019-05-20 DIAGNOSIS — Z23 Encounter for immunization: Secondary | ICD-10-CM

## 2019-05-20 NOTE — Progress Notes (Signed)
   Covid-19 Vaccination Clinic  Name:  Rudolph Seliger    MRN: [id number] DOB: March 17, 1965  05/20/2019  Mr. Robarts was observed post Covid-19 immunization for 15 minutes without incident. He was provided with Vaccine Information Sheet and instruction to access the V-Safe system.   Mr. Ravelo was instructed to call 911 with any severe reactions post vaccine: Marland Kitchen Difficulty breathing  . Swelling of face and throat  . A fast heartbeat  . A bad rash all over body  . Dizziness and weakness   Immunizations Administered    Name Date Dose VIS Date Route   Pfizer COVID-19 Vaccine 05/20/2019 10:45 AM 0.3 mL 02/26/2018 Intramuscular   Manufacturer: [place]   Lot: [id number]   [place]: [id number]

## 2019-06-09 ENCOUNTER — Other Ambulatory Visit: Payer: Self-pay | Admitting: Primary Care

## 2019-06-09 DIAGNOSIS — M109 Gout, unspecified: Secondary | ICD-10-CM

## 2019-06-10 NOTE — Telephone Encounter (Signed)
Last prescribed on 03/11/2019 . Last OV on 12/24/2018 . No future OV scheduled

## 2019-06-11 NOTE — Telephone Encounter (Signed)
Please find out if he's been taking the Uloric gout prevention medication. Needs repeat labs for gout and kidney function, please set up.

## 2019-06-12 ENCOUNTER — Other Ambulatory Visit: Payer: Self-pay | Admitting: Primary Care

## 2019-06-12 NOTE — Telephone Encounter (Signed)
Message left for patient to return my call.  

## 2019-06-13 ENCOUNTER — Other Ambulatory Visit: Payer: Self-pay | Admitting: Primary Care

## 2019-06-13 ENCOUNTER — Other Ambulatory Visit (INDEPENDENT_AMBULATORY_CARE_PROVIDER_SITE_OTHER): Payer: BLUE CROSS/BLUE SHIELD

## 2019-06-13 DIAGNOSIS — N289 Disorder of kidney and ureter, unspecified: Secondary | ICD-10-CM

## 2019-06-13 DIAGNOSIS — M109 Gout, unspecified: Secondary | ICD-10-CM

## 2019-06-13 NOTE — Telephone Encounter (Signed)
Message left for patient to return my call.  

## 2019-06-14 LAB — BASIC METABOLIC PANEL
BUN: 16 mg/dL (ref 7–25)
CO2: 30 mmol/L (ref 20–32)
Calcium: 9.2 mg/dL (ref 8.6–10.3)
Chloride: 103 mmol/L (ref 98–110)
Creat: 1.27 mg/dL (ref 0.70–1.33)
Glucose, Bld: 89 mg/dL (ref 65–99)
Potassium: 4.4 mmol/L (ref 3.5–5.3)
Sodium: 139 mmol/L (ref 135–146)

## 2019-06-14 LAB — URIC ACID: Uric Acid, Serum: 4.2 mg/dL (ref 4.0–8.0)

## 2019-06-17 DIAGNOSIS — M109 Gout, unspecified: Secondary | ICD-10-CM

## 2019-06-17 DIAGNOSIS — M1A9XX0 Chronic gout, unspecified, without tophus (tophi): Secondary | ICD-10-CM

## 2019-06-18 MED ORDER — FEBUXOSTAT 40 MG PO TABS: 40.0000 mg | ORAL_TABLET | Freq: Every day | ORAL | 1 refills | Status: DC

## 2019-06-18 MED ORDER — COLCHICINE 0.6 MG PO TABS: ORAL_TABLET | ORAL | 0 refills | Status: DC

## 2019-07-30 DIAGNOSIS — M1A9XX0 Chronic gout, unspecified, without tophus (tophi): Secondary | ICD-10-CM

## 2019-08-04 MED ORDER — FEBUXOSTAT 80 MG PO TABS: 80.0000 mg | ORAL_TABLET | Freq: Every day | ORAL | 0 refills | Status: DC

## 2019-08-12 ENCOUNTER — Telehealth: Payer: Self-pay

## 2019-08-12 NOTE — Telephone Encounter (Signed)
Per appt notes pt already has appt scheduled on 08/13/19 at 11:40 with Dr Lorelei Pont. FYI to Dr Lorelei Pont.

## 2019-08-12 NOTE — Telephone Encounter (Signed)
[place] Night - Client TELEPHONE ADVICE RECORD [facility] Patient Name: Lonnie Castro Gender: Male DOB: 09-25-1965 Age: 54 Y 3 M 29 D Return Phone Number: [phone] (Primary), [phone] (Secondary) [place] [address] Client [facility] Night - Client Client Site [place] Physician Alma Friendly - NP Contact Type Call Who Is Calling Patient / Member / Family / Caregiver Call Type Triage / Clinical Relationship To Patient Self Return Phone Number [phone] (Secondary) Chief Complaint Foot or Ankle Injury Reason for Call Symptomatic / Request for [place] states wants to schedule appt; Foot injury; Running few days ago; more in toe area; Translation No Nurse Assessment Nurse: Harlow Mares, RN, Suanne Marker Date/Time (Eastern Time): 08/12/2019 8:25:12 AM Confirm and document reason for call. If symptomatic, describe symptoms. ---Caller states wants to schedule appt; Foot injury; Running few days ago; more in toe area; 4-5 days ago. Has the patient had close contact with a person known or suspected to have the novel coronavirus illness OR traveled / lives in area with major community spread (including international travel) in the last 14 days from the onset of symptoms? * If Asymptomatic, screen for exposure and travel within the last 14 days. ---No Does the patient have any new or worsening symptoms? ---Yes Will a triage be completed? ---Yes Related visit to physician within the last 2 weeks? ---No Does the PT have any chronic conditions? (i.e. diabetes, asthma, this includes High risk factors for pregnancy, etc.) ---Yes List chronic conditions. ---gout; Is this a behavioral health or substance abuse call? ---No Guidelines Guideline Title Affirmed Question Affirmed Notes Nurse Date/Time Eilene Ghazi Time) Ankle and Foot Injury [1] Limp when  walking AND [2] due to a twisted ankle or foot Harlow Mares, RN, Suanne Marker 08/12/2019 8:27:42 AM Disp. Time Eilene Ghazi Time) Disposition Final User 08/12/2019 8:30:48 AM See PCP within 24 Hours Yes Harlow Mares, RN, Suanne Marker PLEASE NOTE: All timestamps contained within this report are represented as [place] Standard Time. CONFIDENTIALTY NOTICE: This fax transmission is intended only for the addressee. It contains information that is legally privileged, confidential or otherwise protected from use or disclosure. If you are not the intended recipient, you are strictly prohibited from reviewing, disclosing, copying using or disseminating any of this information or taking any action in reliance on or regarding this information. If you have received this fax in error, please notify us immediately by telephone so that we can arrange for its return to [place]. Phone: [phone], Toll-Free: [phone], Fax: [phone] Page: 2 of 2 Call Id: [id number] [place] Disagree/Comply Comply Caller Understands Yes PreDisposition Call Doctor Care Advice Given Per Guideline SEE PCP WITHIN 24 HOURS: * IF OFFICE WILL BE OPEN: You need to be examined within the next 24 hours. Call your doctor (or NP/PA) when the office opens and make an appointment. TREATMENT OF MILD SPRAINS (E.G., MILD SPRAINED ANKLE): * Use R.I.C.E. (rest, ice, compression, and elevation) for the first 24 to 48 hours. PAIN MEDICINES: * For pain relief, you can take either acetaminophen, ibuprofen, or naproxen. * They are over-the-counter (OTC) pain drugs. You can buy them at the drugstore. * ACETAMINOPHEN - REGULAR STRENGTH TYLENOL: Take 650 mg (two 325 mg pills) by mouth every 4 to 6 hours as needed. Each Regular Strength Tylenol pill has 325 mg of acetaminophen. The most you should take each day is 3,250 mg (10 pills a day). * ACETAMINOPHEN - EXTRA STRENGTH TYLENOL: Take 1,000 mg (two 500  mg pills) every 8 hours as needed. Each Extra Strength Tylenol pill has 500 mg  of acetaminophen. The most you should take each day is 3,000 mg (6 pills a day). * IBUPROFEN (E.G., MOTRIN, ADVIL): Take 400 mg (two 200 mg pills) by mouth every 6 hours. The most you should take each day is 1,200 mg (six 200 mg pills), unless your doctor has told you to take more. CALL BACK IF: * Severe pain persists longer than 2 hours after pain medicine and ice * You become worse. CARE ADVICE given per Foot and Ankle Injury (Adult) guideline. Referrals REFERRED TO PCP OFFICE

## 2019-08-13 ENCOUNTER — Other Ambulatory Visit: Payer: Self-pay

## 2019-08-13 ENCOUNTER — Ambulatory Visit: Payer: BLUE CROSS/BLUE SHIELD | Admitting: Family Medicine

## 2019-08-13 ENCOUNTER — Encounter: Payer: Self-pay | Admitting: Family Medicine

## 2019-08-13 VITALS — BP 110/70 | HR 72 | Temp 97.6°F | Ht 70.0 in | Wt 213.8 lb

## 2019-08-13 DIAGNOSIS — S93529A Sprain of metatarsophalangeal joint of unspecified toe(s), initial encounter: Secondary | ICD-10-CM | POA: Diagnosis not present

## 2019-08-13 DIAGNOSIS — M79672 Pain in left foot: Secondary | ICD-10-CM

## 2019-08-13 NOTE — Progress Notes (Signed)
Lonnie Welling T. Dawid Dupriest, MD, [place]  [facility] [place] at [facility] [place], [address]  Phone: [phone]  FAX: [phone]  Hawkin Charo - 54 y.o. male  MRN [id number]  Date of Birth: 06-08-1965  Date: 08/13/2019  PCP: [name], NP  Referral: [name], NP  Chief Complaint  Patient presents with  . Foot Pain    left, injured foot on Saturday    This visit occurred during the SARS-CoV-2 public health emergency.  Safety protocols were in place, including screening questions prior to the visit, additional usage of staff PPE, and extensive cleaning of exam room while observing appropriate contact time as indicated for disinfecting solutions.   Subjective:   Lonnie Castro is a 54 y.o. very pleasant male patient with Body mass index is 30.67 kg/m. who presents with the following:  1st MTP pain. While running.  2-3 steps.  He was trying to show family members what it was like to run on the ball of his feet, and at that point he developed some pain.  It has improved somewhat, but he is still having some pain.  He denies any specific trauma or injury.  He has never had any specific trauma, injury, or operative intervention in the affected foot.  He is still walking okay, but he is having a mild limp and he thinks that this is primarily secondary to some soreness in his calf.  Dragon base   turf toe, L  Review of Systems is noted in the HPI, as appropriate   Objective:   BP 110/70   Pulse 72   Temp 97.6 F (36.4 C) (Skin)   Ht 5\' 10"  (1.778 m)   Wt 213 lb 12 oz (97 kg)   BMI 30.67 kg/m    GEN: No acute distress; alert,appropriate. PULM: Breathing comfortably in no respiratory distress PSYCH: Normally interactive.    On the left foot the entirety of the distal tib-fib, ankle, hindfoot, midfoot are entirely nontender.  Nontender in MTP joints 2 through 5 as well as all phalanges  and all metatarsal shafts 1 through 5.  Along the MTP joint the patient does have some mild tenderness at the first MTP and he also has some loss of motion.  He is nontender at the sesamoids themselves and he does have some pain with terminal flexion and extension at the first MTP.  Radiology: No results found.  Assessment and Plan:     ICD-10-CM   1. Turf toe, initial encounter  S93.529A   2. Acute pain of left foot  M79.672    Classic turf toe.  This will improve on its own.  Mild alteration of activity including walking and running is advised, and I did give him some Poron to make a metatarsal bar for his running shoes.  Follow-up: No follow-ups on file.  No orders of the defined types were placed in this encounter.  There are no discontinued medications. No orders of the defined types were placed in this encounter.   Signed,  Maud Deed. Kree Armato, MD   Outpatient Encounter Medications as of 08/13/2019  Medication Sig  . colchicine 0.6 MG tablet Take two tablets at flare onset. Repeat with one tablet one hour later. Subsequent days take 1 tablet once daily until flare resolves.  . Febuxostat 80 MG TABS Take 1 tablet (80 mg total) by mouth daily. For gout prevention.  . fluticasone (FLONASE) 50 MCG/ACT  nasal spray SPRAY 2 SPRAYS INTO EACH NOSTRIL EVERY DAY   No facility-administered encounter medications on file as of 08/13/2019.

## 2019-08-21 MED ORDER — COLCHICINE 0.6 MG PO TABS: ORAL_TABLET | ORAL | 0 refills | Status: DC

## 2019-09-23 DIAGNOSIS — H5213 Myopia, bilateral: Secondary | ICD-10-CM | POA: Diagnosis not present

## 2019-10-21 DIAGNOSIS — M109 Gout, unspecified: Secondary | ICD-10-CM

## 2019-10-24 ENCOUNTER — Telehealth: Payer: Self-pay | Admitting: *Deleted

## 2019-10-24 MED ORDER — PREDNISONE 20 MG PO TABS: ORAL_TABLET | ORAL | 0 refills | Status: DC

## 2019-10-24 NOTE — Telephone Encounter (Signed)
Pt left VM at Triage he said he has sent PCP 2 mychart messages letting her know his gout hasn't improved and he doesn't know what else to do about it, pt is requesting some guidance on what is the next step before the weekend. Please call pt back or respond to [facility]

## 2019-10-24 NOTE — Telephone Encounter (Signed)
Noted, will address via [place].

## 2019-11-04 NOTE — Telephone Encounter (Signed)
Per chart review pt seen 08/13/19.

## 2019-11-19 DIAGNOSIS — M1A9XX0 Chronic gout, unspecified, without tophus (tophi): Secondary | ICD-10-CM

## 2019-11-24 ENCOUNTER — Other Ambulatory Visit: Payer: Self-pay

## 2019-11-24 ENCOUNTER — Other Ambulatory Visit: Payer: Self-pay | Admitting: Primary Care

## 2019-11-24 ENCOUNTER — Other Ambulatory Visit (INDEPENDENT_AMBULATORY_CARE_PROVIDER_SITE_OTHER): Payer: BC Managed Care – PPO

## 2019-11-24 DIAGNOSIS — M1A9XX0 Chronic gout, unspecified, without tophus (tophi): Secondary | ICD-10-CM

## 2019-11-24 LAB — BASIC METABOLIC PANEL
BUN: 20 mg/dL (ref 6–23)
CO2: 31 mEq/L (ref 19–32)
Calcium: 8.9 mg/dL (ref 8.4–10.5)
Chloride: 102 mEq/L (ref 96–112)
Creatinine, Ser: 1.36 mg/dL (ref 0.40–1.50)
GFR: 58.54 mL/min — ABNORMAL LOW (ref >60.00)
Glucose, Bld: 135 mg/dL — ABNORMAL HIGH (ref 70–99)
Potassium: 4.3 mEq/L (ref 3.5–5.1)
Sodium: 138 mEq/L (ref 135–145)

## 2019-11-24 LAB — URIC ACID: Uric Acid, Serum: 3.2 mg/dL — ABNORMAL LOW (ref 4.0–7.8)

## 2019-11-24 NOTE — Telephone Encounter (Signed)
[place] Night - Client TELEPHONE ADVICE RECORD Access[facility]urse Patient Name: ABDULKAREEM Castro Gender: Male DOB: 10-06-1965 Age: 54 Y 17 M 9 D Return Phone Number: [phone] (Primary), [phone] (Secondary) Address[place] [address] Client [facility] [place] Night - Client Client Site [place] - Night Physician Alma Friendly - NP Contact Type Call Who Is Calling Patient / Member / Family / Caregiver Call Type Triage / Clinical Relationship To Patient Self Return Phone Number [phone] (Secondary) Chief Complaint Foot Pain Reason for Call Symptomatic / Request for [name] , he is coming in for bloodwork. He woke up with gout this morning. He is experiencing gout in his knee and toe. Translation No Nurse Assessment Nurse: Waymon Budge, RN, Vaughan Basta Date/Time (Eastern Time): 11/23/2019 10:15:34 AM Confirm and document reason for call. If symptomatic, describe symptoms. ---Caller states he has gout. He woke up with gout this morning. He is experiencing pain in his knee and toe. Swelling in knee and mildly in toe. Does the patient have any new or worsening symptoms? ---Yes Will a triage be completed? ---Yes Related visit to physician within the last 2 weeks? ---No Does the PT have any chronic conditions? (i.e. diabetes, asthma, this includes High risk factors for pregnancy, etc.) ---Yes List chronic conditions. ---gout Is this a behavioral health or substance abuse call? ---No Guidelines Guideline Title Affirmed Question Affirmed Notes Nurse Date/Time Eilene Ghazi Time) Knee Pain [1] Swollen joint AND [2] no fever or redness Waymon Budge, RN, Vaughan Basta 11/23/2019 10:18:26 AM Disp. Time Eilene Ghazi Time) Disposition Final User 11/23/2019 10:23:43 AM SEE PCP WITHIN 3 DAYS Yes Waymon Budge, RN, Phineas Semen Disagree/Comply Comply Caller Understands Yes PLEASE NOTE: All timestamps  contained within this report are represented as [place] Standard Time. CONFIDENTIALTY NOTICE: This fax transmission is intended only for the addressee. It contains information that is legally privileged, confidential or otherwise protected from use or disclosure. If you are not the intended recipient, you are strictly prohibited from reviewing, disclosing, copying using or disseminating any of this information or taking any action in reliance on or regarding this information. If you have received this fax in error, please notify us immediately by telephone so that we can arrange for its return to [place]. Phone: [phone], Toll-Free: [phone], Fax: [phone] Page: 2 of 2 Call Id: [id number] PreDisposition InappropriateToAsk Care Advice Given Per Guideline SEE PCP WITHIN 3 DAYS: * You need to be seen within 2 or 3 days. * PCP VISIT: Call your doctor (or NP/PA) during regular office hours and make an appointment. A clinic or urgent care center are good places to go for care if your doctor's office is closed or you can't get an appointment. NOTE: If office will be open tomorrow, tell caller to call then, not in 3 days. CALL BACK IF: * Fever or severe knee pain occurs * Redness or severe swelling occurs * You become worse CARE ADVICE given per Knee Pain (Adult) guideline. Referrals REFERRED TO PCP OFFICE

## 2019-11-25 ENCOUNTER — Ambulatory Visit: Payer: BC Managed Care – PPO | Admitting: Primary Care

## 2019-11-25 ENCOUNTER — Encounter: Payer: Self-pay | Admitting: Primary Care

## 2019-11-25 VITALS — BP 100/54 | HR 77 | Temp 97.5°F | Ht 70.0 in | Wt 218.0 lb

## 2019-11-25 DIAGNOSIS — M2540 Effusion, unspecified joint: Secondary | ICD-10-CM

## 2019-11-25 DIAGNOSIS — M1A9XX0 Chronic gout, unspecified, without tophus (tophi): Secondary | ICD-10-CM | POA: Diagnosis not present

## 2019-11-25 DIAGNOSIS — M109 Gout, unspecified: Secondary | ICD-10-CM | POA: Diagnosis not present

## 2019-11-25 DIAGNOSIS — Z23 Encounter for immunization: Secondary | ICD-10-CM | POA: Diagnosis not present

## 2019-11-25 LAB — C-REACTIVE PROTEIN: CRP: <1 mg/dL (ref 0.5–20.0)

## 2019-11-25 LAB — SEDIMENTATION RATE: Sed Rate: 4 mm/hr (ref 0–20)

## 2019-11-25 MED ORDER — COLCHICINE 0.6 MG PO TABS: ORAL_TABLET | ORAL | 0 refills | Status: DC

## 2019-11-25 MED ORDER — FEBUXOSTAT 80 MG PO TABS: 80.0000 mg | ORAL_TABLET | Freq: Every day | ORAL | 0 refills | Status: DC

## 2019-11-25 NOTE — Assessment & Plan Note (Signed)
Recurrent symptoms over the last year despite urate lowering therapy. Renal function has improved with Uloric vs allopurinol although symptoms were better managed with allopurinol.   Do not feel comfortable resuming allopurinol given decline in renal function while taking. Given continued symptoms with flares, despite uric acid level <6, will send to orthopedics to evaluate.  Will also check autoimmune labs to rule out any other cause.   Refills provided for Uloric and colchicine.

## 2019-11-25 NOTE — Progress Notes (Signed)
   Subjective:    Patient ID: Lonnie Castro, male    DOB: 02-04-1965, 54 y.o.   MRN: [id number]  HPI   This visit occurred during the SARS-CoV-2 public health emergency.  Safety protocols were in place, including screening questions prior to the visit, additional usage of staff PPE, and extensive cleaning of exam room while observing appropriate contact time as indicated for disinfecting solutions.   Lonnie Castro is a 54 year old male with a history of gout, hyperlipidemia & eczema presents today with a chief complaint of a gout flare.    He does have history of gout flares and is currently on a daily febuxostat 80 mg once daily for gout prevention. He is also prescribed colchicine 0.6mg  to take at onset of flare. He had a uric acid level drawn yesterday of 3.2.   On saturday morning he woke up around 4am with tenderness and pain on left great toe and left knee. He had redness in left toe and swelling in left knee. He began his colchicine and 2 days of this treatment his pain seems to have almost completely resolved.     BP Readings from Last 3 Encounters:  11/25/19 (!) 100/54  08/13/19 110/70  12/24/18 124/70    Review of Systems  Constitutional: Negative.   HENT: Negative.   Respiratory: Negative.   Cardiovascular: Negative.   Musculoskeletal: Positive for joint swelling.  Skin: Negative.   Neurological: Negative.         Objective:   Physical Exam Constitutional:      Appearance: Normal appearance.  Pulmonary:     Effort: Pulmonary effort is normal.  Musculoskeletal:        General: Swelling present. No tenderness.     Right knee: Normal.     Left knee: Swelling present. No erythema. Normal range of motion. No tenderness.  Skin:    General: Skin is warm and dry.     Capillary Refill: Capillary refill takes less than 2 seconds.  Neurological:     General: No focal deficit present.     Mental Status: He is alert.           Assessment & Plan:

## 2019-11-25 NOTE — Progress Notes (Signed)
Subjective:    Patient ID: Lonnie Castro, male    DOB: 27-Aug-1965, 54 y.o.   MRN: [id number]  HPI  This visit occurred during the SARS-CoV-2 public health emergency.  Safety protocols were in place, including screening questions prior to the visit, additional usage of staff PPE, and extensive cleaning of exam room while observing appropriate contact time as indicated for disinfecting solutions.   Lonnie Castro is a 54 year old male with a history of chronic gout, hyperlipidemia who presents today to discuss gout.  Originally diagnosed with gout in his early 32's which was only located to the left knee. Over the last several years his symptoms present mostly to the left great toe.   Currently managed on Uloric 80 mg for gout prevention due to prior recurrent episodes of gout flares. Previously managed on allopurinol but this was discontinued due to decline in renal function. Renal function improved once allopurinol was removed and Uloric initiated.   Recent uric acid level of 3.2. Previous uric acid level of 4.2 in June 2021. Four evenings ago he woke up from sleep with left knee swelling and left great toe swelling with redness. He began taking colchicine and symptoms improved and continued to improve. Today his symptoms have nearly resolved.   He has noted more frequent gout flares since transitioning from allopurinol to Uloric. His worst flare was this past summer which was provoked by a lobster and shrimp dinner with beer the night prior. He was prescribed prednisone with eventual resolve.   Review of Systems  Constitutional: Negative for fever.  Musculoskeletal: Positive for arthralgias and joint swelling.  Skin: Positive for color change.       Past Medical History:  Diagnosis Date  . Allergic rhinitis   . Arthritis   . Gout   . Orchitis      Social History   Socioeconomic History  . Marital status: Married    Spouse name: Not on file  . Number of children: Not on file  .  Years of education: Not on file  . Highest education level: Not on file  Occupational History  . Not on file  Tobacco Use  . Smoking status: Never Smoker  . Smokeless tobacco: Never Used  Substance and Sexual Activity  . Alcohol use: Yes    Alcohol/week: 4.0 standard drinks    Types: 4 Standard drinks or equivalent per week  . Drug use: No  . Sexual activity: Not on file  Other Topics Concern  . Not on file  Social History Narrative   Married.   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   Biomedical engineer.   Enjoys exercising, relaxing, flying, snowboarding.   Social Determinants of Health   Financial Resource Strain:   . Difficulty of Paying Living Expenses: Not on file  Food Insecurity:   . Worried About Charity fundraiser in the Last Year: Not on file  . Ran Out of Food in the Last Year: Not on file  Transportation Needs:   . Lack of Transportation (Medical): Not on file  . Lack of Transportation (Non-Medical): Not on file  Physical Activity:   . Days of Exercise per Week: Not on file  . Minutes of Exercise per Session: Not on file  Stress:   . Feeling of Stress : Not on file  Social Connections:   . Frequency of Communication with Friends and Family: Not on file  . Frequency of Social Gatherings with Friends and Family: Not  on file  . Attends Religious Services: Not on file  . Active Member of Clubs or Organizations: Not on file  . Attends Archivist Meetings: Not on file  . Marital Status: Not on file  Intimate Partner Violence:   . Fear of Current or Ex-Partner: Not on file  . Emotionally Abused: Not on file  . Physically Abused: Not on file  . Sexually Abused: Not on file    Past Surgical History:  Procedure Laterality Date  . COLONOSCOPY WITH PROPOFOL N/A 11/03/2014   Procedure: COLONOSCOPY WITH PROPOFOL;  Surgeon: Lollie Sails, MD;  [facility] ENDOSCOPY;  Service: Endoscopy;  Laterality: N/A;  . HERNIA REPAIR  1984  .  NASAL SEPTUM SURGERY  1987    Family History  Problem Relation Age of Onset  . Cancer Father     No Known Allergies  Current Outpatient Medications on File Prior to Visit  Medication Sig Dispense Refill  . fluticasone (FLONASE) 50 MCG/ACT nasal spray SPRAY 2 SPRAYS INTO EACH NOSTRIL EVERY DAY 48 mL 2   No current facility-administered medications on file prior to visit.    BP (!) 100/54   Pulse 77   Temp (!) 97.5 F (36.4 C) (Temporal)   Ht 5\' 10"  (1.778 m)   Wt 218 lb (98.9 kg)   SpO2 95%   BMI 31.28 kg/m    Objective:   Physical Exam Constitutional:      Appearance: He is not ill-appearing.  Pulmonary:     Effort: Pulmonary effort is normal.  Musculoskeletal:       Legs:     Comments: Mild swelling to left anterior knee. No swelling to left great toe. Ambulates well in clinic.  Skin:    General: Skin is warm and dry.     Findings: No erythema.            Assessment & Plan:

## 2019-11-25 NOTE — Patient Instructions (Addendum)
You will be contacted regarding your referral to see ortho regarding your gout and recurrent flares despite treatment.Please le t [place] know if you have not been contacted within two weeks.   Ensure you are consuming 64 ounces of water daily.     Influenza (Flu) Vaccine (Inactivated or Recombinant): What You Need to Know 1. Why get vaccinated? Influenza vaccine can prevent influenza (flu). Flu is a contagious disease that spreads around the [place] every year, usually between October and May. Anyone can get the flu, but it is more dangerous for some people. Infants and young children, people 103 years of age and older, pregnant women, and people with certain health conditions or a weakened immune system are at greatest risk of flu complications. Pneumonia, bronchitis, sinus infections and ear infections are examples of flu-related complications. If you have a medical condition, such as heart disease, cancer or diabetes, flu can make it worse. Flu can cause fever and chills, sore throat, muscle aches, fatigue, cough, headache, and runny or stuffy nose. Some people may have vomiting and diarrhea, though this is more common in children than adults. Each year thousands of people in the [place] die from flu, and many more are hospitalized. Flu vaccine prevents millions of illnesses and flu-related visits to the doctor each year. 2. Influenza vaccine CDC recommends everyone 12 months of age and older get vaccinated every flu season. Children 6 months through 71 years of age may need 2 doses during a single flu season. Everyone else needs only 1 dose each flu season. It takes about 2 weeks for protection to develop after vaccination. There are many flu viruses, and they are always changing. Each year a new flu vaccine is made to protect against three or four viruses that are likely to cause disease in the upcoming flu season. Even when the vaccine doesn't exactly match these viruses, it may still  provide some protection. Influenza vaccine does not cause flu. Influenza vaccine may be given at the same time as other vaccines. 3. Talk with your health care provider Tell your vaccine provider if the person getting the vaccine:  Has had an allergic reaction after a previous dose of influenza vaccine, or has any severe, life-threatening allergies.  Has ever had Guillain-Barr Syndrome (also called GBS). In some cases, your health care provider may decide to postpone influenza vaccination to a future visit. People with minor illnesses, such as a cold, may be vaccinated. People who are moderately or severely ill should usually wait until they recover before getting influenza vaccine. Your health care provider can give you more information. 4. Risks of a vaccine reaction  Soreness, redness, and swelling where shot is given, fever, muscle aches, and headache can happen after influenza vaccine.  There may be a very small increased risk of Guillain-Barr Syndrome (GBS) after inactivated influenza vaccine (the flu shot). Young children who get the flu shot along with pneumococcal vaccine (PCV13), and/or DTaP vaccine at the same time might be slightly more likely to have a seizure caused by fever. Tell your health care provider if a child who is getting flu vaccine has ever had a seizure. People sometimes faint after medical procedures, including vaccination. Tell your provider if you feel dizzy or have vision changes or ringing in the ears. As with any medicine, there is a very remote chance of a vaccine causing a severe allergic reaction, other serious injury, or death. 5. What if there is a serious problem? An allergic reaction could  occur after the vaccinated person leaves the clinic. If you see signs of a severe allergic reaction (hives, swelling of the face and throat, difficulty breathing, a fast heartbeat, dizziness, or weakness), call 9-1-1 and get the person to the nearest hospital. For  other signs that concern you, call your health care provider. Adverse reactions should be reported to the Vaccine Adverse Event Reporting System (VAERS). Your health care provider will usually file this report, or you can do it yourself. Visit the VAERS website at www.vaers.SamedayNews.es or call 9724123280.VAERS is only for reporting reactions, and VAERS staff do not give medical advice. 6. The National Vaccine Injury Compensation Program The [employer] Vaccine Injury Compensation Program (VICP) is a federal program that was created to compensate people who may have been injured by certain vaccines. Visit the VICP website at GoldCloset.com.ee or call 959-871-6657 to learn about the program and about filing a claim. There is a time limit to file a claim for compensation. 7. How can I learn more?  Ask your healthcare provider.  Call your local or state health department.  Contact the Centers for Disease Control and Prevention (CDC): ? Call (539)584-4015 (1-800-CDC-INFO) or ? Visit CDC's https://gibson.com/ Vaccine Information Statement (Interim) Inactivated Influenza Vaccine (08/16/2017) This information is not intended to replace advice given to you by your health care provider. Make sure you discuss any questions you have with your health care provider. Document Revised: 04/09/2018 Document Reviewed: 08/20/2017 Elsevier Patient Education  [employer].

## 2019-11-28 LAB — ANA: Anti Nuclear Antibody (ANA): POSITIVE — AB

## 2019-11-28 LAB — CYCLIC CITRUL PEPTIDE ANTIBODY, IGG: Cyclic Citrullin Peptide Ab: <16 UNITS

## 2019-11-28 LAB — ANTI-NUCLEAR AB-TITER (ANA TITER): ANA Titer 1: 1:40 {titer} — ABNORMAL HIGH

## 2019-11-28 LAB — RHEUMATOID FACTOR: Rheumatoid fact SerPl-aCnc: <14 IU/mL (ref <14)

## 2019-12-02 NOTE — Telephone Encounter (Signed)
Lonnie Castro, can you find out what's going on with his orthopedic referral for recurrent gout? Is there any chance that I could get him in with rheumatology like this week or early next instead?

## 2019-12-04 DIAGNOSIS — M222X2 Patellofemoral disorders, left knee: Secondary | ICD-10-CM | POA: Diagnosis not present

## 2019-12-04 DIAGNOSIS — S86812A Strain of other muscle(s) and tendon(s) at lower leg level, left leg, initial encounter: Secondary | ICD-10-CM | POA: Diagnosis not present

## 2019-12-04 DIAGNOSIS — Z8739 Personal history of other diseases of the musculoskeletal system and connective tissue: Secondary | ICD-10-CM | POA: Diagnosis not present

## 2019-12-04 DIAGNOSIS — M222X9 Patellofemoral disorders, unspecified knee: Secondary | ICD-10-CM | POA: Diagnosis not present

## 2019-12-16 DIAGNOSIS — Z20822 Contact with and (suspected) exposure to covid-19: Secondary | ICD-10-CM | POA: Diagnosis not present

## 2020-01-27 DIAGNOSIS — Z872 Personal history of diseases of the skin and subcutaneous tissue: Secondary | ICD-10-CM | POA: Diagnosis not present

## 2020-01-27 DIAGNOSIS — L578 Other skin changes due to chronic exposure to nonionizing radiation: Secondary | ICD-10-CM | POA: Diagnosis not present

## 2020-01-27 DIAGNOSIS — L821 Other seborrheic keratosis: Secondary | ICD-10-CM | POA: Diagnosis not present

## 2020-01-28 DIAGNOSIS — S86812A Strain of other muscle(s) and tendon(s) at lower leg level, left leg, initial encounter: Secondary | ICD-10-CM | POA: Diagnosis not present

## 2020-01-28 DIAGNOSIS — M25562 Pain in left knee: Secondary | ICD-10-CM | POA: Diagnosis not present

## 2020-02-04 DIAGNOSIS — S86812A Strain of other muscle(s) and tendon(s) at lower leg level, left leg, initial encounter: Secondary | ICD-10-CM | POA: Diagnosis not present

## 2020-02-04 DIAGNOSIS — M25562 Pain in left knee: Secondary | ICD-10-CM | POA: Diagnosis not present

## 2020-02-08 ENCOUNTER — Other Ambulatory Visit: Payer: Self-pay | Admitting: Primary Care

## 2020-02-08 DIAGNOSIS — M1A9XX0 Chronic gout, unspecified, without tophus (tophi): Secondary | ICD-10-CM

## 2020-02-08 NOTE — Telephone Encounter (Signed)
Patient never responded to my message sent on 01/12/20 in response to his message. Please tell him that I really want to help prevent his gout flares...  Can you call him:  Is he taking febuxostat still? Has he had any flares between then and now? Does he want to go to rheum?   Can you read him my last message?

## 2020-02-13 NOTE — Telephone Encounter (Signed)
Left message to return call to our office.  

## 2020-02-16 NOTE — Telephone Encounter (Signed)
Patient returned your call. Please call him back. EM

## 2020-02-17 NOTE — Telephone Encounter (Signed)
Medication refill needs to go to [facility] on [address] ( [address])

## 2020-02-17 NOTE — Telephone Encounter (Signed)
Left message to return call to our office.  

## 2020-02-17 NOTE — Telephone Encounter (Signed)
You said that he is NOT taking febuxostat?  Is he taking allopurinol then?  When was his last flare?

## 2020-02-17 NOTE — Telephone Encounter (Signed)
He is not taking the Febuxostat.  He is not having to take the one for "flares up" only the daily one prescribed. He is in need of a refill on the daily one can't remember the name of it.  NO flare ups  He does not want to go to a rheum. His knees are better.  Patient is relaying this to me because of the time he is getting calls he can't answer cause he is at work.  Please call back with any more questions. EM

## 2020-02-19 NOTE — Telephone Encounter (Signed)
Called patient l/m to call back. Need to clarify information

## 2020-02-19 NOTE — Telephone Encounter (Signed)
He called back and he stated that it was 2-3 months ago.

## 2020-02-19 NOTE — Telephone Encounter (Signed)
Noted, refill sent to pharmacy.  Glad to hear that he has been without an attack in over 2 months.

## 2020-02-19 NOTE — Telephone Encounter (Signed)
Spoke to patient he has been taking the Febuxostat daily. Would like to have refilled. Leaving to go out of town for two weeks tomorrow. Need to have sent in today.

## 2020-03-14 ENCOUNTER — Other Ambulatory Visit: Payer: Self-pay | Admitting: Primary Care

## 2020-05-22 ENCOUNTER — Other Ambulatory Visit: Payer: Self-pay | Admitting: Primary Care

## 2020-05-22 DIAGNOSIS — M1A9XX0 Chronic gout, unspecified, without tophus (tophi): Secondary | ICD-10-CM

## 2020-05-25 NOTE — Telephone Encounter (Signed)
How's he doing on the gout medication, febuxostat? Any recent flares? Just wanted to check and make sure he was doing okay.  If all is well then okay to send refill as pended.  I would like to see him sometime his summer for CPE.

## 2020-06-02 NOTE — Telephone Encounter (Signed)
Pt calling back to f/u on last message

## 2020-06-02 NOTE — Telephone Encounter (Signed)
Left message to return call to our office.  

## 2020-06-04 NOTE — Telephone Encounter (Signed)
Left message to return call to our office.  

## 2020-06-07 NOTE — Telephone Encounter (Signed)
Please send my chart message.

## 2020-06-09 NOTE — Telephone Encounter (Signed)
Mychart sent.

## 2020-09-01 ENCOUNTER — Encounter: Payer: BC Managed Care – PPO | Admitting: Primary Care

## 2020-09-21 ENCOUNTER — Encounter: Payer: BC Managed Care – PPO | Admitting: Primary Care

## 2020-10-01 ENCOUNTER — Other Ambulatory Visit: Payer: Self-pay

## 2020-10-01 ENCOUNTER — Telehealth (INDEPENDENT_AMBULATORY_CARE_PROVIDER_SITE_OTHER): Payer: BC Managed Care – PPO | Admitting: Primary Care

## 2020-10-01 ENCOUNTER — Encounter: Payer: BC Managed Care – PPO | Admitting: Primary Care

## 2020-10-01 ENCOUNTER — Encounter: Payer: Self-pay | Admitting: Primary Care

## 2020-10-01 VITALS — Temp 96.9°F | Ht 70.0 in | Wt 214.0 lb

## 2020-10-01 DIAGNOSIS — J3489 Other specified disorders of nose and nasal sinuses: Secondary | ICD-10-CM | POA: Diagnosis not present

## 2020-10-01 MED ORDER — PREDNISONE 20 MG PO TABS: ORAL_TABLET | ORAL | 0 refills | Status: DC

## 2020-10-01 NOTE — Progress Notes (Signed)
Patient ID: Lonnie Castro, male    DOB: [birth year], 55 y.o.   MRN: [id number]  Virtual visit completed through [employer], a video enabled telemedicine application. Due to national recommendations of social distancing due to COVID-19, a virtual visit is felt to be most appropriate for this patient at this time. Reviewed limitations, risks, security and privacy concerns of performing a virtual visit and the availability of in person appointments. I also reviewed that there may be a patient responsible charge related to this service. The patient agreed to proceed.   Patient location: home Provider location: [place] [facility], office Persons participating in this virtual visit: patient, provider   If any vitals were documented, they were collected by patient at home unless specified below.    Temp (!) 96.9 F (36.1 C) (Oral)   Ht 5\' 10"  (1.778 m)   Wt 214 lb (97.1 kg)   BMI 30.71 kg/m    CC: Headache Subjective:   HPI: Drayke Grabel is a 55 y.o. male with a history of allergic rhinitis, gout, tinnitus presenting on 10/01/2020 to discuss headache.   Head ache is located to the bilateral frontal lobes. Symptoms began five days ago with fatigue, headache. Throughout the week he felt "normal" for the most part, until the last few days. He did have a sore throat with drainage two days ago. Since then he's noticed intermittent headache. Also with watery and itchy eyes yesterday and this morning.  He's had to take Ibuprofen every 4 hours at times, with and without improvement.   History of recurrent sinusitis for years, these symptoms feel similar, but not as bad. He has been compliant to his Flonase every morning, Zyrtec at night.   He denies cough, fevers, chills. His wife has no symptoms.        Relevant past medical, surgical, family and social history reviewed and updated as indicated. Interim medical history since our last visit reviewed. Allergies and medications reviewed and  updated. Outpatient Medications Prior to Visit  Medication Sig Dispense Refill   Cetirizine HCl (ZYRTEC ALLERGY PO) Take by mouth.     Colchicine 0.6 MG CAPS Take by mouth.     Febuxostat 80 MG TABS TAKE 1 TABLET(80 MG) BY MOUTH DAILY FOR GOUT PREVENTION 90 tablet 0   fluticasone (FLONASE) 50 MCG/ACT nasal spray SPRAY 2 SPRAYS INTO EACH NOSTRIL EVERY DAY 48 mL 2   colchicine 0.6 MG tablet Take two tablets at flare onset. Repeat with one tablet one hour later. Subsequent days take 1 tablet once daily until flare resolves. 30 tablet 0   No facility-administered medications prior to visit.     Per HPI unless specifically indicated in ROS section below Review of Systems  Constitutional:  Negative for chills, fatigue and fever.  HENT:  Positive for sinus pressure. Negative for congestion.   Eyes:  Positive for itching.       Watery eyes  Respiratory:  Negative for cough.   Allergic/Immunologic: Positive for environmental allergies.  Objective:  Temp (!) 96.9 F (36.1 C) (Oral)   Ht 5\' 10"  (1.778 m)   Wt 214 lb (97.1 kg)   BMI 30.71 kg/m   Wt Readings from Last 3 Encounters:  10/01/20 214 lb (97.1 kg)  11/25/19 218 lb (98.9 kg)  08/13/19 213 lb 12 oz (97 kg)       Physical exam: Gen: alert, NAD, not ill appearing Pulm: speaks in complete sentences without increased work of breathing Psych: normal mood, normal thought  content      Results for orders placed or performed in visit on 11/25/19  Rheumatoid factor  Result Value Ref Range   Rhuematoid fact SerPl-aCnc <49 <17 IU/mL  Cyclic citrul peptide antibody, IgG  Result Value Ref Range   Cyclic Citrullin Peptide Ab <16 UNITS  Sedimentation rate  Result Value Ref Range   Sed Rate 4 0 - 20 mm/hr  ANA  Result Value Ref Range   Anti Nuclear Antibody (ANA) POSITIVE (A) NEGATIVE  C-reactive protein  Result Value Ref Range   CRP <1.0 0.5 - 20.0 mg/dL  Anti-nuclear ab-titer (ANA titer)  Result Value Ref Range   ANA Titer 1  1:40 (H) titer   ANA Pattern 1 Nuclear, Homogeneous (A)    Assessment & Plan:   Problem List Items Addressed This Visit       Other   Sinus pressure - Primary    Acute for 5 days, seems to be secondary to seasonal/weather changes.   Will trial short burst of oral steroids. Continue Zyrtec.  Rx for prednisone 40 mg once daily x 5 days sent to pharmacy.  He will update early next week if symptoms do not improve. Consider antibiotic regimen for sinusitis at that point.       Relevant Medications   predniSONE (DELTASONE) 20 MG tablet     Meds ordered this encounter  Medications   predniSONE (DELTASONE) 20 MG tablet    Sig: Take 2 tablets by mouth once daily for five days.    Dispense:  10 tablet    Refill:  0    Order Specific Question:   Supervising Provider    Answer:   BEDSOLE, AMY E [[id number]]   No orders of the defined types were placed in this encounter.   I discussed the assessment and treatment plan with the patient. The patient was provided an opportunity to ask questions and all were answered. The patient agreed with the plan and demonstrated an understanding of the instructions. The patient was advised to call back or seek an in-person evaluation if the symptoms worsen or if the condition fails to improve as anticipated.  Follow up plan:  Start prednisone 20 mg tablets. Take 2 tablets by mouth once daily for 5 days.  Please update me early next week if no improvement.  It was a pleasure to see you today!   [name], NP

## 2020-10-01 NOTE — Assessment & Plan Note (Signed)
Acute for 5 days, seems to be secondary to seasonal/weather changes.   Will trial short burst of oral steroids. Continue Zyrtec.  Rx for prednisone 40 mg once daily x 5 days sent to pharmacy.  He will update early next week if symptoms do not improve. Consider antibiotic regimen for sinusitis at that point.

## 2020-10-01 NOTE — Patient Instructions (Signed)
Start prednisone 20 mg tablets. Take 2 tablets by mouth once daily for 5 days.  Please update me early next week if no improvement.  It was a pleasure to see you today!

## 2020-10-08 DIAGNOSIS — R0981 Nasal congestion: Secondary | ICD-10-CM | POA: Diagnosis not present

## 2020-10-08 DIAGNOSIS — J029 Acute pharyngitis, unspecified: Secondary | ICD-10-CM | POA: Diagnosis not present

## 2020-10-08 DIAGNOSIS — Z20822 Contact with and (suspected) exposure to covid-19: Secondary | ICD-10-CM | POA: Diagnosis not present

## 2020-10-08 DIAGNOSIS — R6889 Other general symptoms and signs: Secondary | ICD-10-CM | POA: Diagnosis not present

## 2020-10-20 ENCOUNTER — Ambulatory Visit (INDEPENDENT_AMBULATORY_CARE_PROVIDER_SITE_OTHER): Payer: BC Managed Care – PPO | Admitting: Primary Care

## 2020-10-20 ENCOUNTER — Encounter: Payer: Self-pay | Admitting: Primary Care

## 2020-10-20 ENCOUNTER — Other Ambulatory Visit: Payer: Self-pay

## 2020-10-20 VITALS — BP 110/62 | HR 69 | Temp 97.6°F | Ht 70.0 in | Wt 219.0 lb

## 2020-10-20 DIAGNOSIS — Z1159 Encounter for screening for other viral diseases: Secondary | ICD-10-CM

## 2020-10-20 DIAGNOSIS — Z0000 Encounter for general adult medical examination without abnormal findings: Secondary | ICD-10-CM | POA: Diagnosis not present

## 2020-10-20 DIAGNOSIS — Z114 Encounter for screening for human immunodeficiency virus [HIV]: Secondary | ICD-10-CM | POA: Diagnosis not present

## 2020-10-20 DIAGNOSIS — M109 Gout, unspecified: Secondary | ICD-10-CM | POA: Diagnosis not present

## 2020-10-20 DIAGNOSIS — E782 Mixed hyperlipidemia: Secondary | ICD-10-CM

## 2020-10-20 DIAGNOSIS — Z125 Encounter for screening for malignant neoplasm of prostate: Secondary | ICD-10-CM

## 2020-10-20 DIAGNOSIS — Z23 Encounter for immunization: Secondary | ICD-10-CM | POA: Diagnosis not present

## 2020-10-20 LAB — CBC
HCT: 47.9 % (ref 39.0–52.0)
Hemoglobin: 16.4 g/dL (ref 13.0–17.0)
MCHC: 34.2 g/dL (ref 30.0–36.0)
MCV: 95.6 fl (ref 78.0–100.0)
Platelets: 170 10*3/uL (ref 150.0–400.0)
RBC: 5.01 Mil/uL (ref 4.22–5.81)
RDW: 12.7 % (ref 11.5–15.5)
WBC: 6.2 10*3/uL (ref 4.0–10.5)

## 2020-10-20 LAB — COMPREHENSIVE METABOLIC PANEL
ALT: 18 U/L (ref 0–53)
AST: 18 U/L (ref 0–37)
Albumin: 4.5 g/dL (ref 3.5–5.2)
Alkaline Phosphatase: 41 U/L (ref 39–117)
BUN: 16 mg/dL (ref 6–23)
CO2: 30 mEq/L (ref 19–32)
Calcium: 9.5 mg/dL (ref 8.4–10.5)
Chloride: 102 mEq/L (ref 96–112)
Creatinine, Ser: 1.19 mg/dL (ref 0.40–1.50)
GFR: 68.28 mL/min (ref >60.00)
Glucose, Bld: 87 mg/dL (ref 70–99)
Potassium: 4.4 mEq/L (ref 3.5–5.1)
Sodium: 138 mEq/L (ref 135–145)
Total Bilirubin: 0.6 mg/dL (ref 0.2–1.2)
Total Protein: 6.5 g/dL (ref 6.0–8.3)

## 2020-10-20 LAB — LIPID PANEL
Cholesterol: 231 mg/dL — ABNORMAL HIGH (ref 0–200)
HDL: 53.6 mg/dL (ref >39.00)
LDL Cholesterol: 148 mg/dL — ABNORMAL HIGH (ref 0–99)
NonHDL: 177.25
Total CHOL/HDL Ratio: 4
Triglycerides: 146 mg/dL (ref 0.0–149.0)
VLDL: 29.2 mg/dL (ref 0.0–40.0)

## 2020-10-20 LAB — PSA: PSA: 0.5 ng/mL (ref 0.10–4.00)

## 2020-10-20 LAB — URIC ACID: Uric Acid, Serum: 2.9 mg/dL — ABNORMAL LOW (ref 4.0–7.8)

## 2020-10-20 NOTE — Patient Instructions (Signed)
It was very nice to meet you.  Stop by the lab prior to leaving today. I will notify you of your results once received.  Continue your excellent diet and exercise regimens, you're doing a great job! Preventive Care 24-56 Years Old, Male Preventive care refers to lifestyle choices and visits with your health care provider that can promote health and wellness. This includes: A yearly physical exam. This is also called an annual wellness visit. Regular dental and eye exams. Immunizations. Screening for certain conditions. Healthy lifestyle choices, such as: Eating a healthy diet. Getting regular exercise. Not using drugs or products that contain nicotine and tobacco. Limiting alcohol use. What can I expect for my preventive care visit? Physical exam Your health care provider will check your: Height and weight. These may be used to calculate your BMI (body mass index). BMI is a measurement that tells if you are at a healthy weight. Heart rate and blood pressure. Body temperature. Skin for abnormal spots. Counseling Your health care provider may ask you questions about your: Past medical problems. Family's medical history. Alcohol, tobacco, and drug use. Emotional well-being. Home life and relationship well-being. Sexual activity. Diet, exercise, and sleep habits. Work and work Statistician. Access to firearms. What immunizations do I need? Vaccines are usually given at various ages, according to a schedule. Your health care provider will recommend vaccines for you based on your age, medical history, and lifestyle or other factors, such as travel or where you work. What tests do I need? Blood tests Lipid and cholesterol levels. These may be checked every 5 years, or more often if you are over 66 years old. Hepatitis C test. Hepatitis B test. Screening Lung cancer screening. You may have this screening every year starting at age 75 if you have a 30-pack-year history of smoking and  currently smoke or have quit within the past 15 years. Prostate cancer screening. Recommendations will vary depending on your family history and other risks. Genital exam to check for testicular cancer or hernias. Colorectal cancer screening. All adults should have this screening starting at age 26 and continuing until age 39. Your health care provider may recommend screening at age 78 if you are at increased risk. You will have tests every 1-10 years, depending on your results and the type of screening test. Diabetes screening. This is done by checking your blood sugar (glucose) after you have not eaten for a while (fasting). You may have this done every 1-3 years. STD (sexually transmitted disease) testing, if you are at risk. Follow these instructions at home: Eating and drinking  Eat a diet that includes fresh fruits and vegetables, whole grains, lean protein, and low-fat dairy products. Take vitamin and mineral supplements as recommended by your health care provider. Do not drink alcohol if your health care provider tells you not to drink. If you drink alcohol: Limit how much you have to 0-2 drinks a day. Be aware of how much alcohol is in your drink. In the [place] one drink equals one 12 oz bottle of beer (355 mL), one 5 oz glass of wine (148 mL), or one 1 oz glass of hard liquor (44 mL). Lifestyle Take daily care of your teeth and gums. Brush your teeth every morning and night with fluoride toothpaste. Floss one time each day. Stay active. Exercise for at least 30 minutes 5 or more days each week. Do not use any products that contain nicotine or tobacco, such as cigarettes, e-cigarettes, and chewing tobacco. If you  need help quitting, ask your health care provider. Do not use drugs. If you are sexually active, practice safe sex. Use a condom or other form of protection to prevent STIs (sexually transmitted infections). If told by your health care provider, take low-dose aspirin daily  starting at age 70. Find healthy ways to cope with stress, such as: Meditation, yoga, or listening to music. Journaling. Talking to a trusted person. Spending time with friends and family. Safety Always wear your seat belt while driving or riding in a vehicle. Do not drive: If you have been drinking alcohol. Do not ride with someone who has been drinking. When you are tired or distracted. While texting. Wear a helmet and other protective equipment during sports activities. If you have firearms in your house, make sure you follow all gun safety procedures. What's next? Go to your health care provider once a year for an annual wellness visit. Ask your health care provider how often you should have your eyes and teeth checked. Stay up to date on all vaccines. This information is not intended to replace advice given to you by your health care provider. Make sure you discuss any questions you have with your health care provider. Document Revised: 02/27/2020 Document Reviewed: 12/13/2017 Elsevier Patient Education  2022 [employer].

## 2020-10-20 NOTE — Assessment & Plan Note (Addendum)
Influenza vaccine given today. Immunizations UTD. Colonoscopy UTD, next due in February 2023. Discussed this with patient today.  Commended him on his diet and regular exercise regimens.   Physical exam unremarkable today. Labs pending.  I evaluated patient, was consulted regarding treatment, and agree with assessment and plan per Budd Palmer, RN, DNP student.   [name], NP-C

## 2020-10-20 NOTE — Progress Notes (Signed)
Subjective:    Patient ID: Lonnie Castro, male    DOB: [birth year], 55 y.o.   MRN: [id number]  HPI  Lonnie Castro is a very pleasant 55 y.o. male who presents today for complete physical and follow up of chronic conditions.  Immunizations: -Tetanus: 2016 -Influenza: Due today -Covid-19: 2 vaccines -Shingles: Completed Shingrix  Diet: Healthy diet.  Exercise: Regular exercise.  Eye exam: Completes annually  Dental exam: Completes every 3 months.   Colonoscopy: Completed in 2020, due 2023 PSA: Due  BP Readings from Last 3 Encounters:  10/20/20 110/62  11/25/19 (!) 100/54  08/13/19 110/70       Review of Systems  Constitutional:  Negative for unexpected weight change.  HENT:  Negative for rhinorrhea.   Respiratory:  Negative for cough and shortness of breath.   Cardiovascular:  Negative for chest pain.  Gastrointestinal:  Negative for constipation and diarrhea.  Genitourinary:  Negative for difficulty urinating.  Musculoskeletal:  Negative for arthralgias and myalgias.  Skin:  Negative for rash.  Allergic/Immunologic: Negative for environmental allergies.  Neurological:  Negative for dizziness, numbness and headaches.  Psychiatric/Behavioral:  The patient is not nervous/anxious.         Past Medical History:  Diagnosis Date   Allergic rhinitis    Arthritis    Gout    Orchitis     Social History   Socioeconomic History   Marital status: Married    Spouse name: Not on file   Number of children: Not on file   Years of education: Not on file   Highest education level: Not on file  Occupational History   Not on file  Tobacco Use   Smoking status: Never   Smokeless tobacco: Never  Substance and Sexual Activity   Alcohol use: Yes    Alcohol/week: 4.0 standard drinks    Types: 4 Standard drinks or equivalent per week   Drug use: No   Sexual activity: Not on file  Other Topics Concern   Not on file  Social History Narrative   Married.   1 children, 3  step-children.   Biomedical engineer.   Enjoys exercising, relaxing, flying, snowboarding.   Social Determinants of Health   Financial Resource Strain: Not on file  Food Insecurity: Not on file  Transportation Needs: Not on file  Physical Activity: Not on file  Stress: Not on file  Social Connections: Not on file  Intimate Partner Violence: Not on file    Past Surgical History:  Procedure Laterality Date   COLONOSCOPY WITH PROPOFOL N/A 11/03/2014   Procedure: COLONOSCOPY WITH PROPOFOL;  Surgeon: Lollie Sails, MD;  Location: [facility] ENDOSCOPY;  Service: Endoscopy;  Laterality: N/A;   HERNIA REPAIR  1984   NASAL SEPTUM SURGERY  1987    Family History  Problem Relation Age of Onset   Cancer Father     No Known Allergies  Current Outpatient Medications on File Prior to Visit  Medication Sig Dispense Refill   Cetirizine HCl (ZYRTEC ALLERGY PO) Take by mouth.     Colchicine 0.6 MG CAPS Take by mouth.     Febuxostat 80 MG TABS TAKE 1 TABLET(80 MG) BY MOUTH DAILY FOR GOUT PREVENTION 90 tablet 0   fluticasone (FLONASE) 50 MCG/ACT nasal spray SPRAY 2 SPRAYS INTO EACH NOSTRIL EVERY DAY 48 mL 2   No current facility-administered medications on file prior to visit.    BP 110/62   Pulse 69   Temp 97.6 F (36.4  C) (Temporal)   Ht 5\' 10"  (1.778 m)   Wt 219 lb (99.3 kg)   SpO2 98%   BMI 31.42 kg/m  Objective:   Physical Exam HENT:     Right Ear: Tympanic membrane and ear canal normal.     Left Ear: Tympanic membrane and ear canal normal.     Nose: Nose normal.     Right Sinus: No maxillary sinus tenderness or frontal sinus tenderness.     Left Sinus: No maxillary sinus tenderness or frontal sinus tenderness.  Eyes:     Conjunctiva/sclera: Conjunctivae normal.  Neck:     Thyroid: No thyromegaly.     Vascular: No carotid bruit.  Cardiovascular:     Rate and Rhythm: Normal rate and regular rhythm.     Heart sounds: Normal heart sounds.  Pulmonary:      Effort: Pulmonary effort is normal.     Breath sounds: Normal breath sounds. No wheezing or rales.  Abdominal:     General: Bowel sounds are normal.     Palpations: Abdomen is soft.     Tenderness: There is no abdominal tenderness.  Musculoskeletal:        General: Normal range of motion.     Cervical back: Neck supple.  Skin:    General: Skin is warm and dry.  Neurological:     Mental Status: He is alert and oriented to person, place, and time.     Cranial Nerves: No cranial nerve deficit.     Deep Tendon Reflexes: Reflexes are normal and symmetric.  Psychiatric:        Mood and Affect: Mood normal.          Assessment & Plan:      This visit occurred during the SARS-CoV-2 public health emergency.  Safety protocols were in place, including screening questions prior to the visit, additional usage of staff PPE, and extensive cleaning of exam room while observing appropriate contact time as indicated for disinfecting solutions.

## 2020-10-20 NOTE — Progress Notes (Signed)
Established Patient Office Visit  Subjective:  Patient ID: Lonnie Castro, male    DOB: 05-Dec-1965  Age: 55 y.o. MRN: [id number]  CC: No chief complaint on file.   HPI Lonnie Castro is a 55 year old male with a past medical history of allergic rhinitis, gout, hyperlipidemia, tinnitus, and sinus pressure presents for complete physical and follow up of chronic conditions. No acute complaints today. No gout flare since start of Febuxostat.  Immunizations: -Tetanus: up to date, due in 2026 -Influenza: completed today -Covid-19: [place], 2 doses, last dose 05/2019 -Shingles: Completed in 02/2019  -HPV: pending -Hep C: pending  Diet: Great, intermittent fasting at times. Protein shakes for breakfast, lean proteins with veggies both lunch and dinner, adequate water intake. Social alcohol intake, 1 drink per week.  Exercise: Daily regular exercise, mix of cardio and resistance training, martial arts 3-4 week.   Eye exam: Completes annually  Dental exam: Completes semi-annually, every 3 months  Colonoscopy: Completed in 02/2018, due in February 2023 PSA: Due BP Readings from Last 3 Encounters:  10/20/20 110/62  11/25/19 (!) 100/54  08/13/19 110/70    Wt Readings from Last 3 Encounters:  10/20/20 219 lb (99.3 kg)  10/01/20 214 lb (97.1 kg)  11/25/19 218 lb (98.9 kg)      Past Medical History:  Diagnosis Date   Allergic rhinitis    Arthritis    Gout    Orchitis     Past Surgical History:  Procedure Laterality Date   COLONOSCOPY WITH PROPOFOL N/A 11/03/2014   Procedure: COLONOSCOPY WITH PROPOFOL;  Surgeon: Lollie Sails, MD;  Location: [facility] ENDOSCOPY;  Service: Endoscopy;  Laterality: N/A;   HERNIA REPAIR  1984   NASAL SEPTUM SURGERY  1987    Family History  Problem Relation Age of Onset   Cancer Father     Social History   Socioeconomic History   Marital status: Married    Spouse name: Not on file   Number of children: Not on file   Years of education: Not on  file   Highest education level: Not on file  Occupational History   Not on file  Tobacco Use   Smoking status: Never   Smokeless tobacco: Never  Substance and Sexual Activity   Alcohol use: Yes    Alcohol/week: 4.0 standard drinks    Types: 4 Standard drinks or equivalent per week   Drug use: No   Sexual activity: Not on file  Other Topics Concern   Not on file  Social History Narrative   Married.   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   Biomedical engineer.   Enjoys exercising, relaxing, flying, snowboarding.   Social Determinants of Health   Financial Resource Strain: Not on file  Food Insecurity: Not on file  Transportation Needs: Not on file  Physical Activity: Not on file  Stress: Not on file  Social Connections: Not on file  Intimate Partner Violence: Not on file    Outpatient Medications Prior to Visit  Medication Sig Dispense Refill   Cetirizine HCl (ZYRTEC ALLERGY PO) Take by mouth.     Colchicine 0.6 MG CAPS Take by mouth.     Febuxostat 80 MG TABS TAKE 1 TABLET(80 MG) BY MOUTH DAILY FOR GOUT PREVENTION 90 tablet 0   fluticasone (FLONASE) 50 MCG/ACT nasal spray SPRAY 2 SPRAYS INTO EACH NOSTRIL EVERY DAY 48 mL 2   predniSONE (DELTASONE) 20 MG tablet Take 2 tablets by mouth once daily for five days.  10 tablet 0   No facility-administered medications prior to visit.    No Known Allergies  ROS Review of Systems  Constitutional: Negative.   HENT: Negative.    Eyes: Negative.   Respiratory: Negative.    Cardiovascular: Negative.   Gastrointestinal: Negative.   Genitourinary: Negative.   Musculoskeletal: Negative.   Neurological: Negative.   Psychiatric/Behavioral: Negative.       Objective:    Physical Exam Constitutional:      Appearance: Normal appearance. He is normal weight.  HENT:     Head: Normocephalic.     Right Ear: Tympanic membrane and external ear normal.     Left Ear: Tympanic membrane and external ear normal.      Nose: Nose normal.     Mouth/Throat:     Pharynx: Oropharynx is clear.  Eyes:     Extraocular Movements: Extraocular movements intact.     Pupils: Pupils are equal, round, and reactive to light.  Cardiovascular:     Rate and Rhythm: Normal rate and regular rhythm.     Pulses: Normal pulses.     Heart sounds: Normal heart sounds.  Pulmonary:     Effort: Pulmonary effort is normal.     Breath sounds: Normal breath sounds.  Abdominal:     General: Abdomen is flat. Bowel sounds are normal.     Palpations: There is no mass.     Tenderness: There is no abdominal tenderness. There is no guarding.  Musculoskeletal:        General: Normal range of motion.     Cervical back: Normal range of motion and neck supple.  Skin:    General: Skin is warm and dry.  Neurological:     General: No focal deficit present.     Mental Status: He is alert and oriented to person, place, and time. Mental status is at baseline.  Psychiatric:        Mood and Affect: Mood normal.        Behavior: Behavior normal.    There were no vitals taken for this visit. Wt Readings from Last 3 Encounters:  10/01/20 214 lb (97.1 kg)  11/25/19 218 lb (98.9 kg)  08/13/19 213 lb 12 oz (97 kg)     Health Maintenance Due  Topic Date Due   HIV Screening  Never done   Hepatitis C Screening  Never done   COVID-19 Vaccine (3 - Booster for Pfizer series) 10/20/2019   INFLUENZA VACCINE  08/02/2020    There are no preventive care reminders to display for this patient.  Lab Results  Component Value Date   TSH 1.04 05/11/2017   Lab Results  Component Value Date   WBC 4.1 12/17/2018   HGB 15.7 12/17/2018   HCT 46.2 12/17/2018   MCV 94.5 12/17/2018   PLT 161.0 12/17/2018   Lab Results  Component Value Date   NA 138 11/24/2019   K 4.3 11/24/2019   CO2 31 11/24/2019   GLUCOSE 135 (H) 11/24/2019   BUN 20 11/24/2019   CREATININE 1.36 11/24/2019   BILITOT 0.7 12/17/2018   ALKPHOS 49 12/17/2018   AST 17 12/17/2018    ALT 18 12/17/2018   PROT 6.0 12/17/2018   ALBUMIN 4.2 12/17/2018   CALCIUM 8.9 11/24/2019   GFR 58.54 (L) 11/24/2019   Lab Results  Component Value Date   CHOL 165 12/17/2018   Lab Results  Component Value Date   HDL 40.30 12/17/2018   Lab Results  Component Value Date  LDLCALC 111 (H) 12/17/2018   Lab Results  Component Value Date   TRIG 70.0 12/17/2018   Lab Results  Component Value Date   CHOLHDL 4 12/17/2018   Lab Results  Component Value Date   HGBA1C 5.2 05/11/2017      Assessment & Plan:   Problem List Items Addressed This Visit   None   No orders of the defined types were placed in this encounter.   Follow-up: No follow-ups on file.    Ninfa Meeker, RN

## 2020-10-20 NOTE — Assessment & Plan Note (Addendum)
Asymptomatic, well controlled.  Continue Febuxostat 80 mg daily.  Uric acid and CMP level pending.  I evaluated patient, was consulted regarding treatment, and agree with assessment and plan per Budd Palmer, RN, DNP student.   [name], NP-C

## 2020-10-20 NOTE — Assessment & Plan Note (Signed)
Commended him on regular exercise, healthy diet.  Repeat lipid panel pending.

## 2020-10-21 LAB — HEPATITIS C ANTIBODY
Hepatitis C Ab: NONREACTIVE
SIGNAL TO CUT-OFF: <0.02 (ref <1.00)

## 2020-10-21 LAB — HIV ANTIBODY (ROUTINE TESTING W REFLEX): HIV 1&2 Ab, 4th Generation: NONREACTIVE

## 2020-11-08 DIAGNOSIS — E782 Mixed hyperlipidemia: Secondary | ICD-10-CM

## 2020-11-08 DIAGNOSIS — M1A9XX0 Chronic gout, unspecified, without tophus (tophi): Secondary | ICD-10-CM

## 2020-11-30 MED ORDER — FEBUXOSTAT 80 MG PO TABS: ORAL_TABLET | ORAL | 2 refills | Status: DC

## 2020-12-03 ENCOUNTER — Other Ambulatory Visit: Payer: Self-pay | Admitting: Primary Care

## 2020-12-03 DIAGNOSIS — M1A9XX0 Chronic gout, unspecified, without tophus (tophi): Secondary | ICD-10-CM

## 2021-03-24 ENCOUNTER — Other Ambulatory Visit (INDEPENDENT_AMBULATORY_CARE_PROVIDER_SITE_OTHER): Payer: BC Managed Care – PPO

## 2021-03-24 ENCOUNTER — Other Ambulatory Visit: Payer: Self-pay

## 2021-03-24 DIAGNOSIS — E782 Mixed hyperlipidemia: Secondary | ICD-10-CM | POA: Diagnosis not present

## 2021-03-25 LAB — LIPID PANEL
Cholesterol: 201 mg/dL — ABNORMAL HIGH (ref 0–200)
HDL: 48.3 mg/dL (ref >39.00)
LDL Cholesterol: 114 mg/dL — ABNORMAL HIGH (ref 0–99)
NonHDL: 152.41
Total CHOL/HDL Ratio: 4
Triglycerides: 192 mg/dL — ABNORMAL HIGH (ref 0.0–149.0)
VLDL: 38.4 mg/dL (ref 0.0–40.0)

## 2021-03-30 ENCOUNTER — Encounter: Payer: Self-pay | Admitting: Internal Medicine

## 2021-03-30 ENCOUNTER — Ambulatory Visit: Payer: BC Managed Care – PPO | Admitting: Internal Medicine

## 2021-03-30 ENCOUNTER — Other Ambulatory Visit: Payer: Self-pay

## 2021-03-30 DIAGNOSIS — J069 Acute upper respiratory infection, unspecified: Secondary | ICD-10-CM | POA: Diagnosis not present

## 2021-03-30 NOTE — Assessment & Plan Note (Signed)
Mild symptoms ?Okay for mucinex if helps ?Analgesics prn ?Avoid aerobic exercise till feeling it is gone ?

## 2021-03-30 NOTE — Progress Notes (Signed)
? ?Subjective:  ? ? Patient ID: Lonnie Castro, male    DOB: 1965/11/11, 56 y.o.   MRN: [id number] ? ?HPI ?Here due to respiratory infection ? ?Does not feel that bad ?Still exercising but here at his wife's insistence ?Does have some chest congestion for 3-4 days ?After hot shower this morning--seems to clear up ?Intermittent cough ?Some gurgling with deep breath ?Cough is non productive ? ?Slight wheezing as child--no asthma ?No fever ?No chills or sweats ?No head congestion or sinus symptoms ? ?Current Outpatient Medications on File Prior to Visit  ?Medication Sig Dispense Refill  ? Cetirizine HCl (ZYRTEC ALLERGY PO) Take by mouth.    ? Colchicine 0.6 MG CAPS Take by mouth.    ? Febuxostat 80 MG TABS TAKE 1 TABLET(80 MG) BY MOUTH DAILY FOR GOUT PREVENTION 90 tablet 2  ? fluticasone (FLONASE) 50 MCG/ACT nasal spray SPRAY 2 SPRAYS INTO EACH NOSTRIL EVERY DAY 48 mL 2  ? guaiFENesin (MUCINEX) 600 MG 12 hr tablet Take by mouth 2 (two) times daily.    ? ?No current facility-administered medications on file prior to visit.  ? ? ?No Known Allergies ? ?Past Medical History:  ?Diagnosis Date  ? Allergic rhinitis   ? Arthritis   ? Gout   ? Orchitis   ? ? ?Past Surgical History:  ?Procedure Laterality Date  ? COLONOSCOPY WITH PROPOFOL N/A 11/03/2014  ? Procedure: COLONOSCOPY WITH PROPOFOL;  Surgeon: Lollie Sails, MD;  Location: [facility] ENDOSCOPY;  Service: Endoscopy;  Laterality: N/A;  ? HERNIA REPAIR  1984  ? NASAL SEPTUM SURGERY  1987  ? ? ?Family History  ?Problem Relation Age of Onset  ? Cancer Father   ? ? ?Social History  ? ?Socioeconomic History  ? Marital status: Married  ?  Spouse name: Not on file  ? Number of children: Not on file  ? Years of education: Not on file  ? Highest education level: Not on file  ?Occupational History  ? Not on file  ?Tobacco Use  ? Smoking status: Never  ? Smokeless tobacco: Never  ?Substance and Sexual Activity  ? Alcohol use: Yes  ?  Alcohol/week: 4.0 standard drinks  ?  Types: 4  Standard drinks or equivalent per week  ? Drug use: No  ? Sexual activity: Not on file  ?Other Topics Concern  ? Not on file  ?Social History Narrative  ? Married.  ? 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  ? 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  ? ?Social Determinants of Health  ? ?Financial Resource Strain: Not on file  ?Food Insecurity: Not on file  ?Transportation Needs: Not on file  ?Physical Activity: Not on file  ?Stress: Not on file  ?Social Connections: Not on file  ?Intimate Partner Violence: Not on file  ? ?Review of Systems ?No N/V ?Eating okay ? ?   ?Objective:  ? Physical Exam ?Constitutional:   ?   Appearance: Normal appearance.  ?HENT:  ?   Head:  ?   Comments: No sinus tenderness ?   Right Ear: Tympanic membrane and ear canal normal.  ?   Ears:  ?   Comments: Cerumen on left ?   Nose:  ?   Comments: Mild inflammation ?   Mouth/Throat:  ?   Pharynx: No oropharyngeal exudate or posterior oropharyngeal erythema.  ?Pulmonary:  ?   Effort: Pulmonary effort is normal.  ?   Breath sounds: Normal breath sounds. No wheezing or  rales.  ?Musculoskeletal:  ?   Cervical back: Neck supple.  ?Lymphadenopathy:  ?   Cervical: No cervical adenopathy.  ?Neurological:  ?   Mental Status: He is alert.  ?  ? ? ? ? ?   ?Assessment & Plan:  ? ?

## 2021-04-04 ENCOUNTER — Ambulatory Visit: Payer: BC Managed Care – PPO | Admitting: Family

## 2021-04-04 ENCOUNTER — Encounter: Payer: Self-pay | Admitting: Family

## 2021-04-04 ENCOUNTER — Ambulatory Visit (INDEPENDENT_AMBULATORY_CARE_PROVIDER_SITE_OTHER)
Admission: RE | Admit: 2021-04-04 | Discharge: 2021-04-04 | Disposition: A | Discharge status: Home / Self Care | Payer: BC Managed Care – PPO | Source: Ambulatory Visit | Attending: Family | Admitting: Family

## 2021-04-04 VITALS — BP 116/76 | HR 52 | Temp 98.2°F | Resp 16 | Ht 70.0 in | Wt 220.1 lb

## 2021-04-04 DIAGNOSIS — J40 Bronchitis, not specified as acute or chronic: Secondary | ICD-10-CM | POA: Diagnosis not present

## 2021-04-04 DIAGNOSIS — R5383 Other fatigue: Secondary | ICD-10-CM

## 2021-04-04 DIAGNOSIS — R062 Wheezing: Secondary | ICD-10-CM

## 2021-04-04 HISTORY — DX: Other fatigue: R53.83

## 2021-04-04 HISTORY — DX: Bronchitis, not specified as acute or chronic: J40

## 2021-04-04 MED ORDER — METHYLPREDNISOLONE 4 MG PO TBPK
ORAL_TABLET | ORAL | 0 refills | Status: DC
Start: 2021-04-04 — End: 2021-09-28

## 2021-04-04 MED ORDER — AMOXICILLIN-POT CLAVULANATE 875-125 MG PO TABS: 1.0000 | ORAL_TABLET | Freq: Two times a day (BID) | ORAL | 0 refills | Status: DC

## 2021-04-04 NOTE — Patient Instructions (Signed)
Complete xray(s) prior to leaving today. I will notify you of your results once received. ? ?It was a pleasure seeing you today! Please do not hesitate to reach out with any questions and or concerns. ? ?Regards,  ? ?Twilia Yaklin ?FNP-C ? ? ?

## 2021-04-04 NOTE — Progress Notes (Signed)
? ?Established Patient Office Visit ? ?Subjective:  ?Patient ID: Lonnie Castro, male    DOB: 1965/05/31  Age: 56 y.o. MRN: [id number] ? ?CC:  ?Chief Complaint  ?Patient presents with  ? Wheezing  ?  X 1 week  ? ? ?HPI ?Lonnie Castro is here today with concerns.  ? ?One week ago started with some chest congestion and feels some wheezing/gurgling in his chest when he breathes in, describes it kind of as a rattle when he takes a deep breath. Dry non productive cough.  ? ?He is taking generic mucinex  ?Takes zyrtec everyday as well as daily flonase.  ?No fever no chills. No ear pain no sore throat.  ? ?Past Medical History:  ?Diagnosis Date  ? Allergic rhinitis   ? Arthritis   ? Gout   ? Orchitis   ? ? ?Past Surgical History:  ?Procedure Laterality Date  ? COLONOSCOPY WITH PROPOFOL N/A 11/03/2014  ? Procedure: COLONOSCOPY WITH PROPOFOL;  Surgeon: Lollie Sails, MD;  Location: [facility] ENDOSCOPY;  Service: Endoscopy;  Laterality: N/A;  ? HERNIA REPAIR  1984  ? NASAL SEPTUM SURGERY  1987  ? ? ?Family History  ?Problem Relation Age of Onset  ? Cancer Father   ? ? ?Social History  ? ?Socioeconomic History  ? Marital status: Married  ?  Spouse name: Not on file  ? Number of children: Not on file  ? Years of education: Not on file  ? Highest education level: Not on file  ?Occupational History  ? Not on file  ?Tobacco Use  ? Smoking status: Never  ? Smokeless tobacco: Never  ?Substance and Sexual Activity  ? Alcohol use: Yes  ?  Alcohol/week: 4.0 standard drinks  ?  Types: 4 Standard drinks or equivalent per week  ? Drug use: No  ? Sexual activity: Not on file  ?Other Topics Concern  ? Not on file  ?Social History Narrative  ? Married.  ? 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  ? 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  ? ?Social Determinants of Health  ? ?Financial Resource Strain: Not on file  ?Food Insecurity: Not on file  ?Transportation Needs: Not on file  ?Physical Activity: Not  on file  ?Stress: Not on file  ?Social Connections: Not on file  ?Intimate Partner Violence: Not on file  ? ? ?Outpatient Medications Prior to Visit  ?Medication Sig Dispense Refill  ? Cetirizine HCl (ZYRTEC ALLERGY PO) Take by mouth.    ? Colchicine 0.6 MG CAPS Take by mouth.    ? Febuxostat 80 MG TABS TAKE 1 TABLET(80 MG) BY MOUTH DAILY FOR GOUT PREVENTION 90 tablet 2  ? fluticasone (FLONASE) 50 MCG/ACT nasal spray SPRAY 2 SPRAYS INTO EACH NOSTRIL EVERY DAY 48 mL 2  ? guaiFENesin (MUCINEX) 600 MG 12 hr tablet Take by mouth 2 (two) times daily.    ? ?No facility-administered medications prior to visit.  ? ? ?No Known Allergies ? ?ROS ?Review of Systems  ?Constitutional:  Negative for chills and fever.  ?HENT:  Positive for postnasal drip. Negative for congestion, ear pain, rhinorrhea, sinus pain and sore throat.   ?Respiratory:  Positive for cough (dry non productive) and wheezing. Negative for shortness of breath.   ?Cardiovascular:  Negative for chest pain and palpitations.  ? ?  ?Objective:  ?  ?Physical Exam ?Constitutional:   ?   General: He is not in acute distress. ?   Appearance: Normal appearance. He  is normal weight. He is not ill-appearing, toxic-appearing or diaphoretic.  ?HENT:  ?   Head: Normocephalic.  ?   Right Ear: Tympanic membrane normal.  ?   Left Ear: Tympanic membrane normal.  ?   Nose: Nose normal.  ?   Mouth/Throat:  ?   Mouth: Mucous membranes are moist.  ?Eyes:  ?   Pupils: Pupils are equal, round, and reactive to light.  ?Cardiovascular:  ?   Rate and Rhythm: Normal rate and regular rhythm.  ?Pulmonary:  ?   Effort: Pulmonary effort is normal.  ?   Breath sounds: Examination of the left-upper field reveals decreased breath sounds and wheezing. Decreased breath sounds and wheezing present.  ?Musculoskeletal:  ?   Cervical back: Normal range of motion.  ?Neurological:  ?   Mental Status: He is alert.  ? ? ?BP 116/76   Pulse (!) 52   Temp 98.2 ?F (36.8 ?C)   Resp 16   Ht '5\' 10"'$  (1.778 m)    Wt 220 lb 2 oz (99.8 kg)   SpO2 94%   BMI 31.58 kg/m?  ?Wt Readings from Last 3 Encounters:  ?04/04/21 220 lb 2 oz (99.8 kg)  ?03/30/21 221 lb (100.2 kg)  ?10/20/20 219 lb (99.3 kg)  ? ? ? ?Health Maintenance Due  ?Topic Date Due  ? COVID-19 Vaccine (3 - Booster for Pfizer series) 07/15/2019  ? ? ?There are no preventive care reminders to display for this patient. ? ?Lab Results  ?Component Value Date  ? TSH 1.04 05/11/2017  ? ?Lab Results  ?Component Value Date  ? WBC 6.2 10/20/2020  ? HGB 16.4 10/20/2020  ? HCT 47.9 10/20/2020  ? MCV 95.6 10/20/2020  ? PLT 170.0 10/20/2020  ? ?Lab Results  ?Component Value Date  ? NA 138 10/20/2020  ? K 4.4 10/20/2020  ? CO2 30 10/20/2020  ? GLUCOSE 87 10/20/2020  ? BUN 16 10/20/2020  ? CREATININE 1.19 10/20/2020  ? BILITOT 0.6 10/20/2020  ? ALKPHOS 41 10/20/2020  ? AST 18 10/20/2020  ? ALT 18 10/20/2020  ? PROT 6.5 10/20/2020  ? ALBUMIN 4.5 10/20/2020  ? CALCIUM 9.5 10/20/2020  ? GFR 68.28 10/20/2020  ? ?Lab Results  ?Component Value Date  ? HGBA1C 5.2 05/11/2017  ? ? ?  ?Assessment & Plan:  ? ?Problem List Items Addressed This Visit   ? ?  ? Respiratory  ? Bronchitis  ?  rx augmentin 875/125 mg  ?Medrol dose pack 4 mg rx  ?Pending cxr results.  ?  ?  ? Relevant Medications  ? amoxicillin-clavulanate (AUGMENTIN) 875-125 MG tablet  ?  ? Other  ? Other fatigue  ?  Increase oral fluids ?  ?  ? Wheezing on inspiration - Primary  ?  Left upper lobe ?Ordered chest xray r/o pneumonia ?rx medrol dose pack 4 mg ?  ?  ? Relevant Medications  ? methylPREDNISolone (MEDROL DOSEPAK) 4 MG TBPK tablet  ? Other Relevant Orders  ? DG Chest 2 View  ? ? ?Meds ordered this encounter  ?Medications  ? methylPREDNISolone (MEDROL DOSEPAK) 4 MG TBPK tablet  ?  Sig: Take per package instructions  ?  Dispense:  21 tablet  ?  Refill:  0  ?  Order Specific Question:   Supervising Provider  ?  Answer:   BEDSOLE, AMY E [[id number]]  ? amoxicillin-clavulanate (AUGMENTIN) 875-125 MG tablet  ?  Sig: Take 1 tablet  by mouth 2 (two) times daily.  ?  Dispense:  20 tablet  ?  Refill:  0  ?  Order Specific Question:   Supervising Provider  ?  Answer:   BEDSOLE, AMY E [[id number]]  ? ? ?Follow-up: Return if symptoms worsen or fail to improve with pcp.  ? ? ?Eugenia Pancoast, FNP ?

## 2021-04-04 NOTE — Assessment & Plan Note (Signed)
Left upper lobe ?Ordered chest xray r/o pneumonia ?rx medrol dose pack 4 mg ?

## 2021-04-04 NOTE — Assessment & Plan Note (Signed)
Increase oral fluids ?

## 2021-04-04 NOTE — Assessment & Plan Note (Signed)
rx augmentin 875/125 mg  ?Medrol dose pack 4 mg rx  ?Pending cxr results.  ?

## 2021-05-30 IMAGING — DX DG FOOT COMPLETE 3+V*L*
3 series · 3 of 3 positions shown · non-contrast
Comparison: None.

CLINICAL DATA: Acute left lateral foot pain, no trauma

EXAM:
LEFT FOOT - COMPLETE 3+ VIEW

[foot ap]
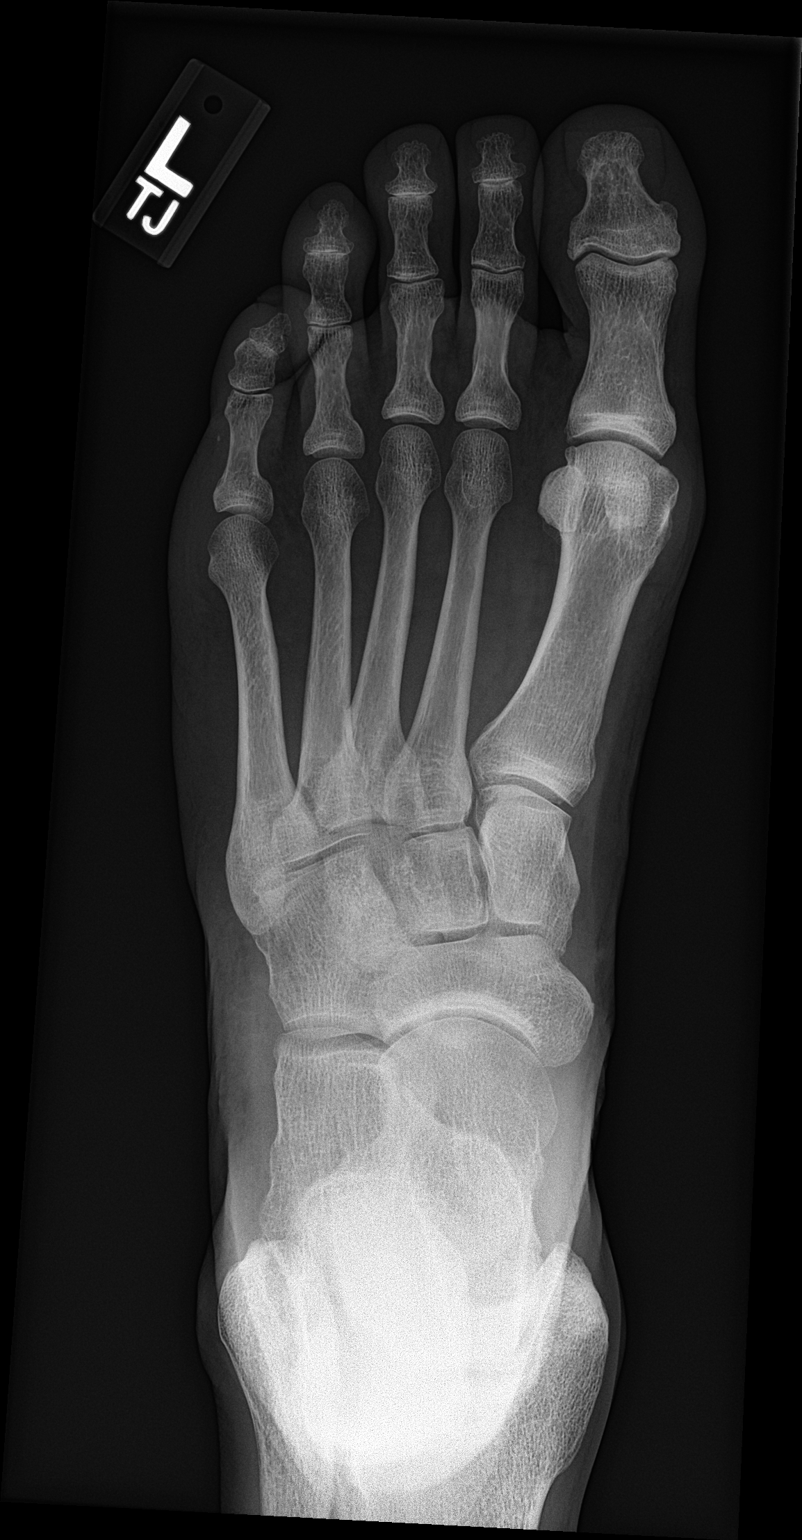

[foot obl]
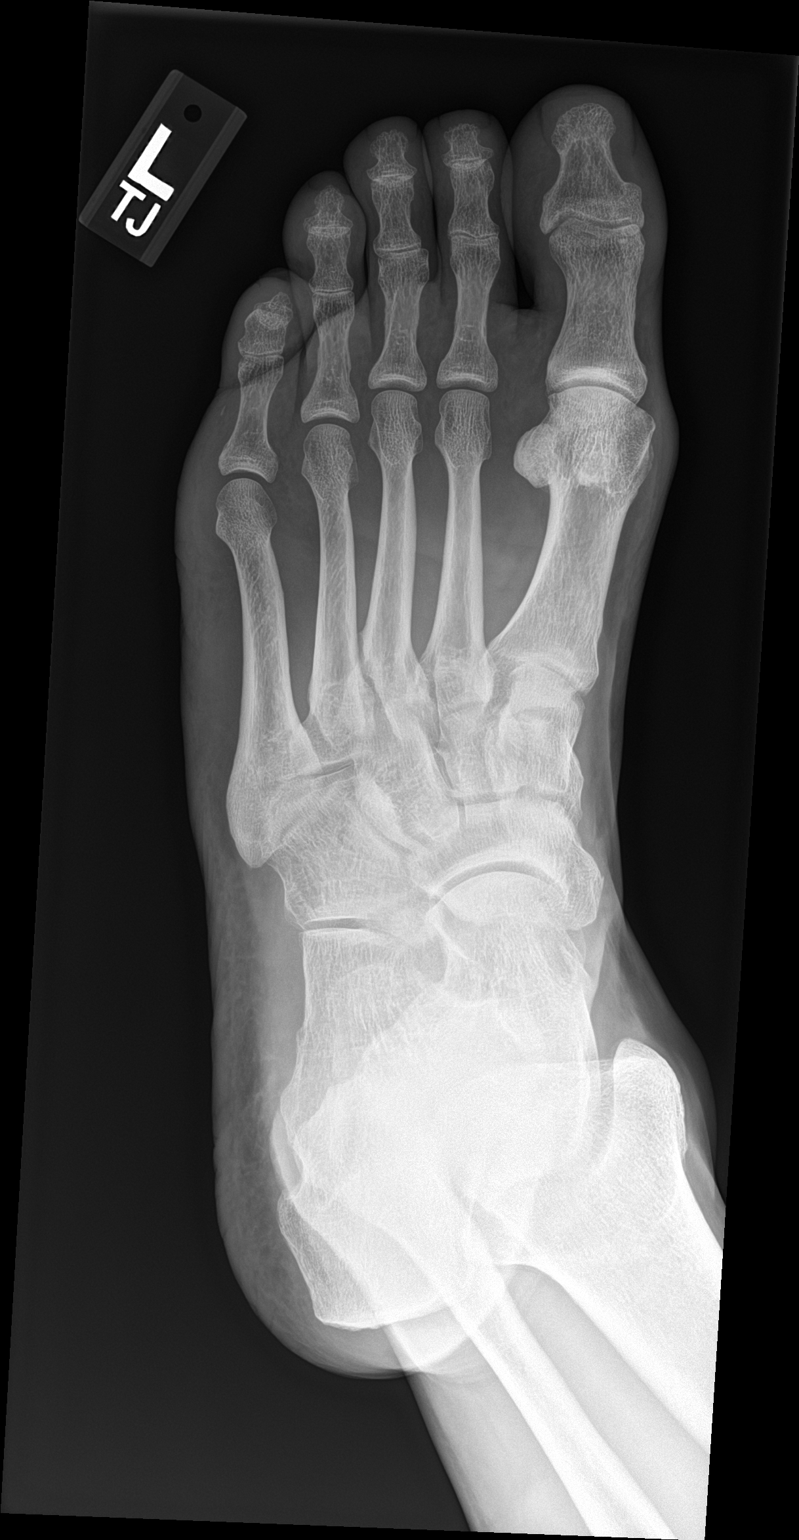

[foot lat]
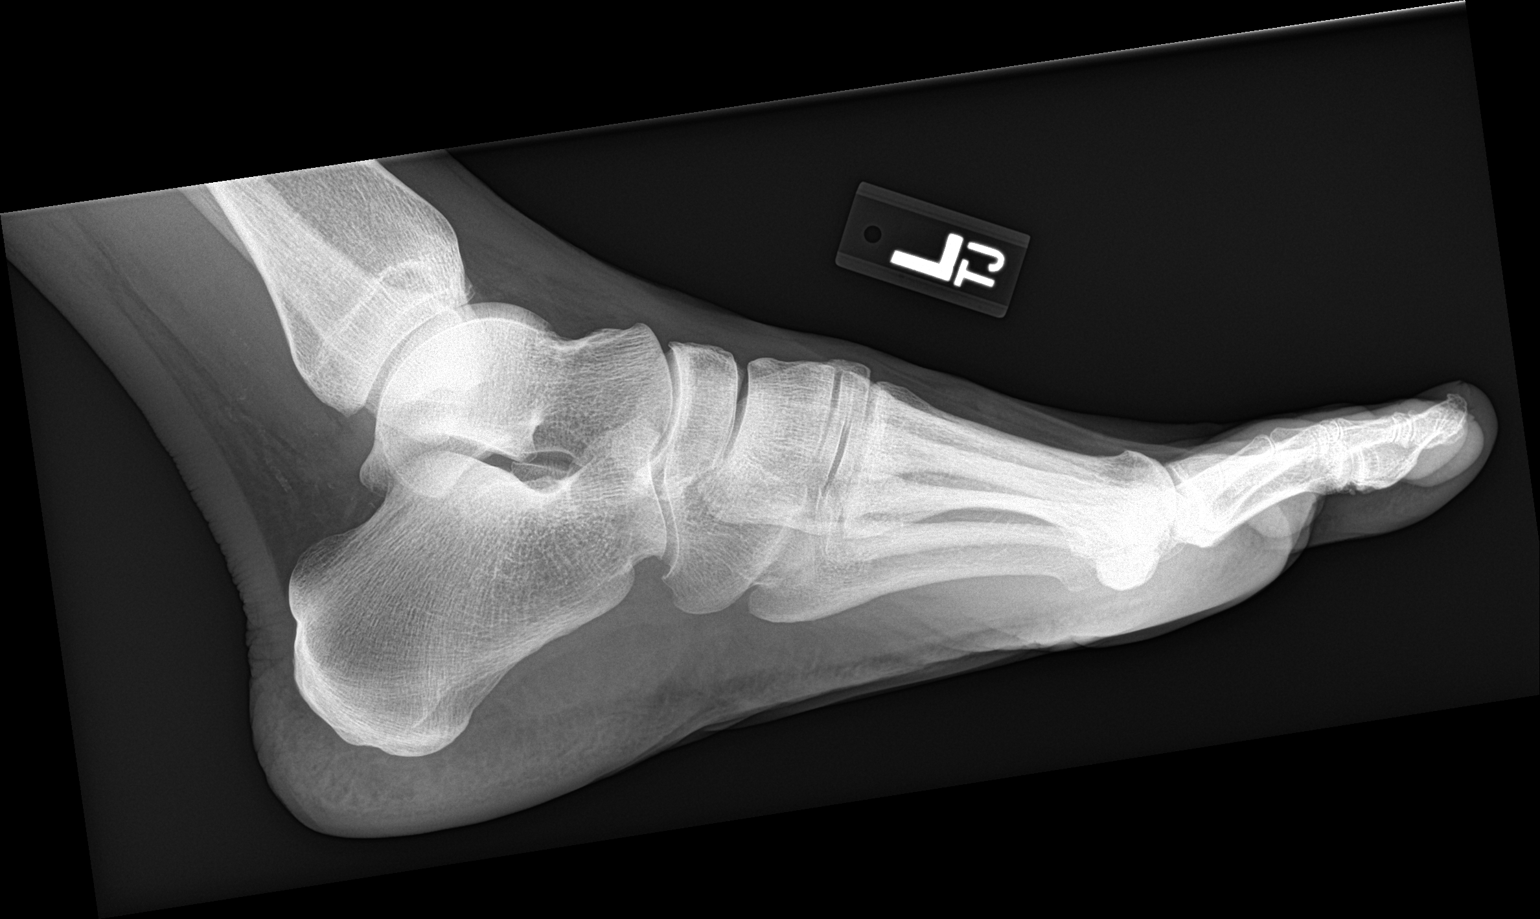

[3 of 3 positions shown; findings below may reference images not displayed]

FINDINGS: Minimal lateral soft tissue swelling of the forefoot extending from
the tip of the fifth digit to the base of the fifth metatarsal.
Small linear radiodensity in the soft tissues lateral to the shaft
of the fifth proximal phalanx. No acute fracture or traumatic
malalignment is seen. Minimal interphalangeal arthrosis most
pronounced at the first digit as well as mild first
metatarsophalangeal arthrosis. Midfoot and hindfoot alignment is
grossly preserved though incompletely assessed on nonweightbearing
films.
IMPRESSION: 1. Lateral soft tissue swelling with small linear radiodensity in
the soft tissues lateral to the shaft of the fifth proximal phalanx.
This may represent a small foreign body. Correlate with visual
inspection and patient history.
2. No acute osseous abnormality.
3. Mild degenerative changes.

## 2021-07-26 DIAGNOSIS — S99912A Unspecified injury of left ankle, initial encounter: Secondary | ICD-10-CM | POA: Diagnosis not present

## 2021-07-26 DIAGNOSIS — S93402A Sprain of unspecified ligament of left ankle, initial encounter: Secondary | ICD-10-CM | POA: Diagnosis not present

## 2021-07-28 ENCOUNTER — Other Ambulatory Visit: Payer: Self-pay | Admitting: Primary Care

## 2021-07-28 DIAGNOSIS — M1A9XX0 Chronic gout, unspecified, without tophus (tophi): Secondary | ICD-10-CM

## 2021-09-28 ENCOUNTER — Ambulatory Visit: Payer: BC Managed Care – PPO | Admitting: Primary Care

## 2021-09-28 ENCOUNTER — Encounter: Payer: Self-pay | Admitting: Primary Care

## 2021-09-28 VITALS — BP 118/78 | HR 70 | Temp 98.1°F | Ht 70.0 in | Wt 215.0 lb

## 2021-09-28 DIAGNOSIS — Z23 Encounter for immunization: Secondary | ICD-10-CM

## 2021-09-28 DIAGNOSIS — E782 Mixed hyperlipidemia: Secondary | ICD-10-CM | POA: Diagnosis not present

## 2021-09-28 DIAGNOSIS — M1A9XX0 Chronic gout, unspecified, without tophus (tophi): Secondary | ICD-10-CM

## 2021-09-28 DIAGNOSIS — Z125 Encounter for screening for malignant neoplasm of prostate: Secondary | ICD-10-CM | POA: Diagnosis not present

## 2021-09-28 LAB — LIPID PANEL
Cholesterol: 226 mg/dL — ABNORMAL HIGH (ref 0–200)
HDL: 48.2 mg/dL (ref >39.00)
LDL Cholesterol: 152 mg/dL — ABNORMAL HIGH (ref 0–99)
NonHDL: 178.07
Total CHOL/HDL Ratio: 5
Triglycerides: 129 mg/dL (ref 0.0–149.0)
VLDL: 25.8 mg/dL (ref 0.0–40.0)

## 2021-09-28 LAB — COMPREHENSIVE METABOLIC PANEL
ALT: 18 U/L (ref 0–53)
AST: 20 U/L (ref 0–37)
Albumin: 4.4 g/dL (ref 3.5–5.2)
Alkaline Phosphatase: 42 U/L (ref 39–117)
BUN: 22 mg/dL (ref 6–23)
CO2: 32 mEq/L (ref 19–32)
Calcium: 9.4 mg/dL (ref 8.4–10.5)
Chloride: 101 mEq/L (ref 96–112)
Creatinine, Ser: 1.19 mg/dL (ref 0.40–1.50)
GFR: 67.83 mL/min (ref >60.00)
Glucose, Bld: 73 mg/dL (ref 70–99)
Potassium: 4.2 mEq/L (ref 3.5–5.1)
Sodium: 137 mEq/L (ref 135–145)
Total Bilirubin: 0.8 mg/dL (ref 0.2–1.2)
Total Protein: 6.6 g/dL (ref 6.0–8.3)

## 2021-09-28 LAB — URIC ACID: Uric Acid, Serum: 4 mg/dL (ref 4.0–7.8)

## 2021-09-28 LAB — CBC
HCT: 45.6 % (ref 39.0–52.0)
Hemoglobin: 15.7 g/dL (ref 13.0–17.0)
MCHC: 34.5 g/dL (ref 30.0–36.0)
MCV: 95.1 fl (ref 78.0–100.0)
Platelets: 171 10*3/uL (ref 150.0–400.0)
RBC: 4.79 Mil/uL (ref 4.22–5.81)
RDW: 12.5 % (ref 11.5–15.5)
WBC: 4.2 10*3/uL (ref 4.0–10.5)

## 2021-09-28 LAB — PSA: PSA: 0.18 ng/mL (ref 0.10–4.00)

## 2021-09-28 NOTE — Patient Instructions (Signed)
Stop by the lab prior to leaving today. I will notify you of your results once received.   It was a pleasure to see you today!  

## 2021-09-28 NOTE — Progress Notes (Signed)
Subjective:    Patient ID: Lonnie Castro, male    DOB: Jan 30, 1965, 56 y.o.   MRN: [id number]  HPI  Lonnie Castro is a very pleasant 56 y.o. male with a history of chronic gout, hyperlipidemia who presents today for follow up of chronic conditions and for form completion.  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 Further clarification of his chronic gout is needed from the [facility] for continued license.   1) Chronic Gout: Currently managed on Febuxostat 80 mg daily for gout flare prevention for which he has been taking for several years. Prior to Febuxostat he was managed on allopurinol daily for gout prevention. He is also managed on colchicine 0.6 mg tablets PRN for gout flares.  His last uric acid level was 2.9 in October 2022, he is due for repeat uric acid level today. He denies recent gout flares, his last major gout flare was three years ago after eating shellfish. He keeps his colchicine on hand anytime he is traveling in case of a flare.  2) Hyperlipidemia: Currently not managed on treatment. He continues to watch his diet, incorporates vegetables, lean protein. He is exercising 7 days weekly, working out at [employer], running, bicycling.   He denies chest pain, dizziness, headaches.   Wt Readings from Last 3 Encounters:  09/28/21 215 lb (97.5 kg)  04/04/21 220 lb 2 oz (99.8 kg)  03/30/21 221 lb (100.2 kg)     Review of Systems  Eyes:  Negative for visual disturbance.  Respiratory:  Negative for shortness of breath.   Cardiovascular:  Negative for chest pain.  Gastrointestinal:  Negative for constipation and diarrhea.  Genitourinary:  Negative for difficulty urinating.  Neurological:  Negative for dizziness and headaches.         Past Medical History:  Diagnosis Date   Allergic rhinitis    Arthritis    Bronchitis 04/04/2021   Gout    Orchitis    Other fatigue  04/04/2021    Social History   Socioeconomic History   Marital status: Married    Spouse name: Not on file   Number of children: Not on file   Years of education: Not on file   Highest education level: Not on file  Occupational History   Not on file  Tobacco Use   Smoking status: Never   Smokeless tobacco: Never  Substance and Sexual Activity   Alcohol use: Yes    Alcohol/week: 4.0 standard drinks of alcohol    Types: 4 Standard drinks or equivalent per week   Drug use: No   Sexual activity: Not on file  Other Topics Concern   Not on file  Social History Narrative   Married.   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   Biomedical engineer.   Enjoys exercising, relaxing, flying, snowboarding.   Social Determinants of Health   Financial Resource Strain: Not on file  Food Insecurity: Not on file  Transportation Needs: Not on file  Physical Activity: Not on file  Stress: Not on file  Social Connections: Not on file  Intimate Partner Violence: Not on file    Past Surgical History:  Procedure Laterality Date   COLONOSCOPY WITH PROPOFOL N/A 11/03/2014   Procedure: COLONOSCOPY WITH PROPOFOL;  Surgeon: Lollie Sails, MD;  Location: [facility] ENDOSCOPY;  Service: Endoscopy;  Laterality: N/A;   HERNIA REPAIR  1984   NASAL  SEPTUM SURGERY  1987    Family History  Problem Relation Age of Onset   Cancer Father     No Known Allergies  Current Outpatient Medications on File Prior to Visit  Medication Sig Dispense Refill   Cetirizine HCl (ZYRTEC ALLERGY PO) Take by mouth.     Colchicine 0.6 MG CAPS Take by mouth.     Febuxostat 80 MG TABS TAKE 1 TABLET BY MOUTH DAILY FOR GOUT PREVENTION. Due in late October for office visit. 90 tablet 0   fluticasone (FLONASE) 50 MCG/ACT nasal spray SPRAY 2 SPRAYS INTO EACH NOSTRIL EVERY DAY 48 mL 2   No current facility-administered medications on file prior to visit.    BP 118/78   Pulse 70   Temp 98.1 F (36.7 C) (Temporal)    Ht '5\' 10"'$  (1.778 m)   Wt 215 lb (97.5 kg)   SpO2 98%   BMI 30.85 kg/m  Objective:   Physical Exam Cardiovascular:     Rate and Rhythm: Normal rate and regular rhythm.  Pulmonary:     Effort: Pulmonary effort is normal.     Breath sounds: Normal breath sounds. No wheezing or rales.  Abdominal:     General: Bowel sounds are normal.     Palpations: Abdomen is soft.     Tenderness: There is no abdominal tenderness.  Musculoskeletal:     Cervical back: Neck supple.  Skin:    General: Skin is warm and dry.  Neurological:     Mental Status: He is alert and oriented to person, place, and time.  Psychiatric:        Mood and Affect: Mood normal.           Assessment & Plan:   Problem List Items Addressed This Visit       Other   Chronic gout    Controlled.  Continue Febuxostat 80 mg daily for prevention. Continue colchicine 0.6 mg PRN for flares for which he uses infrequently.   Repeat uric acid level pending.      Relevant Orders   Uric acid   Hyperlipidemia    Commended him on a healthy lifestyle, encouraged to continue. Repeat lipid panel pending.      Relevant Orders   Lipid panel   Comprehensive metabolic panel   CBC   Other Visit Diagnoses     Screening for prostate cancer    -  Primary   Relevant Orders   PSA          [name], NP

## 2021-09-28 NOTE — Assessment & Plan Note (Signed)
Commended him on a healthy lifestyle, encouraged to continue. Repeat lipid panel pending.

## 2021-09-28 NOTE — Assessment & Plan Note (Signed)
Controlled.  Continue Febuxostat 80 mg daily for prevention. Continue colchicine 0.6 mg PRN for flares for which he uses infrequently.   Repeat uric acid level pending.

## 2021-09-30 ENCOUNTER — Telehealth: Payer: Self-pay | Admitting: Primary Care

## 2021-09-30 NOTE — Telephone Encounter (Signed)
Called and spoke with patient. Notified him his forms were ready for pickup and he needed to sign office release form to get the documents. Forms are in my holding for pt folder.

## 2021-09-30 NOTE — Telephone Encounter (Signed)
Patient called and stated he was returning [facility] call and stated he will be in meetings all day and will call back in free.

## 2021-10-01 DIAGNOSIS — E782 Mixed hyperlipidemia: Secondary | ICD-10-CM

## 2021-10-14 ENCOUNTER — Other Ambulatory Visit (INDEPENDENT_AMBULATORY_CARE_PROVIDER_SITE_OTHER): Payer: BC Managed Care – PPO

## 2021-10-14 DIAGNOSIS — E782 Mixed hyperlipidemia: Secondary | ICD-10-CM | POA: Diagnosis not present

## 2021-10-18 LAB — LIPOPROTEIN A (LPA): Lipoprotein (a): 43 nmol/L (ref <75)

## 2021-10-18 NOTE — Telephone Encounter (Signed)
Patient called in stating that the [place] did  not receive the form that was supposed to be sent over for him. He would like to know if it could be sent over again? [phone] option 1, a number to reach [place] is theres any questions

## 2021-10-18 NOTE — Telephone Encounter (Signed)
Spoke with patient verified he has a blank copy of this form he will bring by for [place] to refill out.  Original form was sent to scan and has not been entered into chart for reprint yet.  Patient needs this from completed ASAP.

## 2021-10-18 NOTE — Telephone Encounter (Signed)
Patients wife dropped off form. I have completed the requirements for office notes and placed in mail to [place].  Holding a copy in my folder in case of any further problems.

## 2021-10-19 NOTE — Telephone Encounter (Signed)
Called and spoke with patients wife. I have faxed forms to the number provided and received a confirmation that the fax went through. Holding forms in my folder in case of any further issues.

## 2021-10-19 NOTE — Telephone Encounter (Signed)
Patient called in and would like have form faxed as well,it must have PI # On it,which is :[id number]  Fax#: [phone]

## 2021-12-14 ENCOUNTER — Other Ambulatory Visit: Payer: Self-pay | Admitting: Primary Care

## 2021-12-14 DIAGNOSIS — M1A9XX0 Chronic gout, unspecified, without tophus (tophi): Secondary | ICD-10-CM

## 2021-12-19 ENCOUNTER — Other Ambulatory Visit (HOSPITAL_COMMUNITY): Payer: Self-pay

## 2021-12-19 ENCOUNTER — Telehealth: Payer: Self-pay

## 2021-12-19 NOTE — Telephone Encounter (Signed)
Pharmacy Patient Advocate Encounter  Prior Authorization for Febuxostat '80MG'$  tablets has been approved.    KeyElvin Castro Effective dates: 12.18.23 through 12.16.24  Spoke with Pharmacy to process.        Pharmacy Patient Advocate Encounter   Received notification from [facility] that prior authorization for FEBUXOSTAT '80MG'$  is required/requested.    PA submitted on 12.18.23 to (ins) [employer] via CoverMyMeds [facility]  Status is pending

## 2021-12-20 NOTE — Telephone Encounter (Signed)
Left voicemail notifying patient, per dpr,  of approval of PA, will call back if any further questions.

## 2022-01-31 ENCOUNTER — Ambulatory Visit: Payer: BC Managed Care – PPO | Admitting: Primary Care

## 2022-01-31 ENCOUNTER — Encounter: Payer: Self-pay | Admitting: Primary Care

## 2022-01-31 VITALS — BP 118/80 | HR 75 | Temp 98.1°F | Ht 70.0 in | Wt 222.0 lb

## 2022-01-31 DIAGNOSIS — R0989 Other specified symptoms and signs involving the circulatory and respiratory systems: Secondary | ICD-10-CM | POA: Diagnosis not present

## 2022-01-31 NOTE — Assessment & Plan Note (Addendum)
Symptoms suggestive of Viral URI vs allergies.    Physical exam today grossly benign.   Notes and chest x-ray reviewed from 2023.   He will update if symptoms worsen. Consider Augmentin/prednisone if warranted. He will update later this week  I evaluated patient, was consulted regarding treatment, and agree with assessment and plan per Tinnie Gens, RN, DNP student.   [name], NP-C

## 2022-01-31 NOTE — Progress Notes (Signed)
Established Patient Office Visit  Subjective   Patient ID: Lonnie Castro, male    DOB: 06-04-1965  Age: 57 y.o. MRN: [id number]  Chief Complaint  Patient presents with   Chest Congestion    Chest congestion for a little over a week Declines fever, body aches, chills, sweats, headaches, fatigue, nasal congestion    HPI  Lonnie Castro is a 57 year old male with past medical history of hyperlipidemia, gout presents today for an acute visit.   Symptoms onset was 1.5 weeks ago, include chest congestion. Denies any wheezing, shortness of breath or difficulty breathing. Denies fever, body aches, chills, sweats, headaches, fatigue or nasal congestion. Has not not been checked for covid. His wife had covid 6 weeks ago. Denies any ear pain, sinus pain or pressure, no cough.   He has tried Nyquil for the last week with no relief. It did help him sleep. Denies chest pain or tightness.   Reports that his symptoms are similar to last year when he was evaluated by Dr. Silvio Pate for a viral URI and then again 4 days later, he was evaluated by Eugenia Pancoast, FNP and was treated with prednisolone and Augmentin. He had a chest x-ray on 04/04/21 with no active cardiopulmonary disease.  He is going out of town and is concerned that his symptoms will worsen.   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/28/2014   Chronic gout 05/25/2014   Rhinitis, allergic 05/25/2014   Past Medical History:  Diagnosis Date   Allergic rhinitis    Arthritis    Bronchitis 04/04/2021   Gout    Orchitis    Other fatigue 04/04/2021   Past Surgical History:  Procedure Laterality Date   COLONOSCOPY WITH PROPOFOL N/A 11/03/2014   Procedure: COLONOSCOPY WITH PROPOFOL;  Surgeon: Lollie Sails, MD;  Location: [facility] ENDOSCOPY;  Service: Endoscopy;  Laterality: N/A;   HERNIA  REPAIR  1984   NASAL SEPTUM SURGERY  1987   Family History  Problem Relation Age of Onset   Cancer Father    No Known Allergies    Review of Systems  Constitutional:  Negative for malaise/fatigue.  HENT:  Negative for congestion, ear pain and sinus pain.   Respiratory:  Negative for cough, sputum production, shortness of breath and wheezing.   Cardiovascular:  Negative for chest pain.  Neurological:  Negative for dizziness and headaches.      Objective:     BP 118/80   Pulse 75   Temp 98.1 F (36.7 C) (Temporal)   Ht '5\' 10"'$  (1.778 m)   Wt 222 lb (100.7 kg)   SpO2 97%   BMI 31.85 kg/m  BP Readings from Last 3 Encounters:  01/31/22 118/80  09/28/21 118/78  04/04/21 116/76   Wt Readings from Last 3 Encounters:  01/31/22 222 lb (100.7 kg)  09/28/21 215 lb (97.5 kg)  04/04/21 220 lb 2 oz (99.8 kg)      Physical Exam Vitals and nursing note reviewed.  Constitutional:      Appearance: Normal appearance.  HENT:     Right Ear: Tympanic membrane, ear canal and external ear normal.     Left Ear: Tympanic membrane, ear canal and external ear normal.     Nose: No congestion.     Mouth/Throat:     Mouth: Mucous membranes are moist.  Cardiovascular:     Rate and Rhythm: Normal rate and regular rhythm.     Pulses: Normal pulses.     Heart sounds: Normal heart sounds.  Pulmonary:     Effort: Pulmonary effort is normal.     Breath sounds: Normal breath sounds.  Neurological:     Mental Status: He is alert and oriented to person, place, and time.      No results found for any visits on 01/31/22.     The 10-year ASCVD risk score (Arnett DK, et al., 2019) is: 6.8%    Assessment & Plan:   Problem List Items Addressed This Visit       Respiratory   Chest congestion - Primary    Symptoms suggestive of Viral URI.   Physical exam grossly benign.   Notes and chest x-ray reviewed from 2023.   He will update if symptoms worsen.       Tinnie Gens, BSN-RN,  DNP STUDENT

## 2022-01-31 NOTE — Progress Notes (Signed)
Subjective:    Patient ID: Lonnie Castro, male    DOB: Mar 22, 1965, 57 y.o.   MRN: [id number]  HPI  Lonnie Castro is a very pleasant 57 y.o. male with a history of allergic rhinitis, sinusitis, chronic gout, hyperlipidemia who presents today to discuss chest congestion.   Symptom onset 10-12 days ago with chest congestion.   He denies fevers, body aches, chills, sweats, headaches, fatigue, nasal congestion, cough, sinus pressure. He has not tested for Covid-19 infection.  He's been taking Nyquil at night without improvement. Symptoms occurred one year ago, evaluated by Dr. Silvio Pate, diagnosed with viral URI. No improvement in symptoms so he returned and saw [place] who prescribed Augmentin and prednisone and felt instantly better.    Today he's feeling better. He's mostly concerned as he will be leaving for a trip in a few weeks. He feels well overall.   Review of Systems  Constitutional:  Negative for chills, fatigue and fever.  HENT:  Positive for congestion. Negative for postnasal drip, sinus pressure and sore throat.   Respiratory:  Negative for chest tightness and shortness of breath.   Cardiovascular:  Negative for chest pain.  Neurological:  Negative for headaches.         Past Medical History:  Diagnosis Date   Allergic rhinitis    Arthritis    Bronchitis 04/04/2021   Gout    Orchitis    Other fatigue 04/04/2021    Social History   Socioeconomic History   Marital status: Married    Spouse name: Not on file   Number of children: Not on file   Years of education: Not on file   Highest education level: Not on file  Occupational History   Not on file  Tobacco Use   Smoking status: Never   Smokeless tobacco: Never  Substance and Sexual Activity   Alcohol use: Yes    Alcohol/week: 4.0 standard drinks of alcohol    Types: 4 Standard drinks or equivalent per week   Drug use: No   Sexual activity: Not on file  Other Topics Concern   Not on file  Social History  Narrative   Married.   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   Biomedical engineer.   Enjoys exercising, relaxing, flying, snowboarding.   Social Determinants of Health   Financial Resource Strain: Not on file  Food Insecurity: Not on file  Transportation Needs: Not on file  Physical Activity: Not on file  Stress: Not on file  Social Connections: Not on file  Intimate Partner Violence: Not on file    Past Surgical History:  Procedure Laterality Date   COLONOSCOPY WITH PROPOFOL N/A 11/03/2014   Procedure: COLONOSCOPY WITH PROPOFOL;  Surgeon: Lollie Sails, MD;  Location: [facility] ENDOSCOPY;  Service: Endoscopy;  Laterality: N/A;   HERNIA REPAIR  1984   NASAL SEPTUM SURGERY  1987    Family History  Problem Relation Age of Onset   Cancer Father     No Known Allergies  Current Outpatient Medications on File Prior to Visit  Medication Sig Dispense Refill   Cetirizine HCl (ZYRTEC ALLERGY PO) Take by mouth.     Colchicine 0.6 MG CAPS Take by mouth.     Febuxostat 80 MG TABS TAKE 1 TABLET BY MOUTH DAILY FOR GOUT PREVENTION 90 tablet 2   fluticasone (FLONASE) 50 MCG/ACT nasal spray SPRAY 2 SPRAYS INTO EACH NOSTRIL EVERY DAY 48 mL 2   No current facility-administered medications on file prior to  visit.    BP 118/80   Pulse 75   Temp 98.1 F (36.7 C) (Temporal)   Ht '5\' 10"'$  (1.778 m)   Wt 222 lb (100.7 kg)   SpO2 97%   BMI 31.85 kg/m  Objective:   Physical Exam Constitutional:      Appearance: He is not ill-appearing.  HENT:     Right Ear: Tympanic membrane and ear canal normal.     Left Ear: Tympanic membrane and ear canal normal.     Nose: No mucosal edema.     Right Sinus: No maxillary sinus tenderness or frontal sinus tenderness.     Left Sinus: No maxillary sinus tenderness or frontal sinus tenderness.     Mouth/Throat:     Mouth: Mucous membranes are moist.  Eyes:     Conjunctiva/sclera: Conjunctivae normal.  Cardiovascular:     Rate and Rhythm:  Normal rate and regular rhythm.  Pulmonary:     Effort: Pulmonary effort is normal.     Breath sounds: Normal breath sounds. No wheezing or rales.  Musculoskeletal:     Cervical back: Neck supple.  Skin:    General: Skin is warm and dry.           Assessment & Plan:  Chest congestion Assessment & Plan: Symptoms suggestive of Viral URI vs allergies.    Physical exam today grossly benign.   Notes and chest x-ray reviewed from 2023.   He will update if symptoms worsen. Consider Augmentin/prednisone if warranted. He will update later this week  I evaluated patient, was consulted regarding treatment, and agree with assessment and plan per Tinnie Gens, RN, DNP student.   [name], NP-C          [name], NP

## 2022-02-07 DIAGNOSIS — Z01818 Encounter for other preprocedural examination: Secondary | ICD-10-CM | POA: Diagnosis not present

## 2022-03-06 DIAGNOSIS — M9903 Segmental and somatic dysfunction of lumbar region: Secondary | ICD-10-CM | POA: Diagnosis not present

## 2022-03-06 DIAGNOSIS — M9901 Segmental and somatic dysfunction of cervical region: Secondary | ICD-10-CM | POA: Diagnosis not present

## 2022-03-06 DIAGNOSIS — M9902 Segmental and somatic dysfunction of thoracic region: Secondary | ICD-10-CM | POA: Diagnosis not present

## 2022-03-06 DIAGNOSIS — M9905 Segmental and somatic dysfunction of pelvic region: Secondary | ICD-10-CM | POA: Diagnosis not present

## 2022-03-13 DIAGNOSIS — M9903 Segmental and somatic dysfunction of lumbar region: Secondary | ICD-10-CM | POA: Diagnosis not present

## 2022-03-13 DIAGNOSIS — M9905 Segmental and somatic dysfunction of pelvic region: Secondary | ICD-10-CM | POA: Diagnosis not present

## 2022-03-13 DIAGNOSIS — M9901 Segmental and somatic dysfunction of cervical region: Secondary | ICD-10-CM | POA: Diagnosis not present

## 2022-03-13 DIAGNOSIS — M9902 Segmental and somatic dysfunction of thoracic region: Secondary | ICD-10-CM | POA: Diagnosis not present

## 2022-03-21 DIAGNOSIS — M9902 Segmental and somatic dysfunction of thoracic region: Secondary | ICD-10-CM | POA: Diagnosis not present

## 2022-03-21 DIAGNOSIS — M9903 Segmental and somatic dysfunction of lumbar region: Secondary | ICD-10-CM | POA: Diagnosis not present

## 2022-03-21 DIAGNOSIS — M9905 Segmental and somatic dysfunction of pelvic region: Secondary | ICD-10-CM | POA: Diagnosis not present

## 2022-03-21 DIAGNOSIS — M9901 Segmental and somatic dysfunction of cervical region: Secondary | ICD-10-CM | POA: Diagnosis not present

## 2022-03-30 ENCOUNTER — Encounter: Payer: Self-pay | Admitting: Family Medicine

## 2022-03-30 ENCOUNTER — Ambulatory Visit: Payer: BC Managed Care – PPO | Admitting: Family Medicine

## 2022-03-30 VITALS — BP 110/80 | HR 74 | Temp 97.8°F | Ht 70.0 in | Wt 218.2 lb

## 2022-03-30 DIAGNOSIS — J069 Acute upper respiratory infection, unspecified: Secondary | ICD-10-CM

## 2022-03-30 NOTE — Progress Notes (Signed)
Patient ID: Aamari Bendana, male    DOB: 03-Sep-1965, 57 y.o.   MRN: [id number]  This visit was conducted in person.  BP 110/80   Pulse 74   Temp 97.8 F (36.6 C) (Temporal)   Ht 5\' 10"  (1.778 m)   Wt 218 lb 4 oz (99 kg)   SpO2 96%   BMI 31.32 kg/m    CC:  Chief Complaint  Patient presents with   Nasal Congestion    Started on Sunday Morning No Covid Test   Cough    Subjective:   HPI: Abdulmalik Biesinger is a 57 y.o. male presenting on 03/30/2022 for Nasal Congestion (Started on Sunday Morning/No Covid Test) and Cough   Date of onset: 5 days ago Initial symptoms included   chest tightness, chest congestion, nasal congestion.  Symptoms continued.  No ear pain, no ST, no face pain.  No  fever, chills.  No body aches.    Sick contacts:  none COVID testing:   none     He has tried to treat with   mucinex, , nyquil at night     No history of chronic lung disease such as asthma or COPD. History of allergies. Non-smoker.  Reviewed OV note from 01/31/21     Relevant past medical, surgical, family and social history reviewed and updated as indicated. Interim medical history since our last visit reviewed. Allergies and medications reviewed and updated. Outpatient Medications Prior to Visit  Medication Sig Dispense Refill   Cetirizine HCl (ZYRTEC ALLERGY PO) Take by mouth.     Colchicine 0.6 MG CAPS Take by mouth.     Febuxostat 80 MG TABS TAKE 1 TABLET BY MOUTH DAILY FOR GOUT PREVENTION 90 tablet 2   fluticasone (FLONASE) 50 MCG/ACT nasal spray SPRAY 2 SPRAYS INTO EACH NOSTRIL EVERY DAY 48 mL 2   No facility-administered medications prior to visit.     Per HPI unless specifically indicated in ROS section below Review of Systems  Constitutional:  Negative for fatigue and fever.  HENT:  Positive for congestion. Negative for ear pain.   Eyes:  Negative for pain.  Respiratory:  Positive for cough. Negative for shortness of breath.   Cardiovascular:  Negative for chest  pain, palpitations and leg swelling.  Gastrointestinal:  Negative for abdominal pain.  Genitourinary:  Negative for dysuria.  Musculoskeletal:  Negative for arthralgias.  Neurological:  Negative for syncope, light-headedness and headaches.  Psychiatric/Behavioral:  Negative for dysphoric mood.    Objective:  BP 110/80   Pulse 74   Temp 97.8 F (36.6 C) (Temporal)   Ht 5\' 10"  (1.778 m)   Wt 218 lb 4 oz (99 kg)   SpO2 96%   BMI 31.32 kg/m   Wt Readings from Last 3 Encounters:  03/30/22 218 lb 4 oz (99 kg)  01/31/22 222 lb (100.7 kg)  09/28/21 215 lb (97.5 kg)      Physical Exam Constitutional:      General: He is not in acute distress.    Appearance: Normal appearance. He is well-developed. He is not ill-appearing or toxic-appearing.  HENT:     Head: Normocephalic and atraumatic.     Right Ear: Hearing, tympanic membrane, ear canal and external ear normal. No tenderness. No foreign body. Tympanic membrane is not retracted or bulging.     Left Ear: Hearing, tympanic membrane, ear canal and external ear normal. No tenderness. No foreign body. Tympanic membrane is not retracted or bulging.     Nose:  Congestion present. No mucosal edema or rhinorrhea.     Right Sinus: No maxillary sinus tenderness or frontal sinus tenderness.     Left Sinus: No maxillary sinus tenderness or frontal sinus tenderness.     Mouth/Throat:     Dentition: Normal dentition. No dental caries.     Pharynx: Uvula midline. No oropharyngeal exudate.     Tonsils: No tonsillar abscesses.  Eyes:     General: Lids are normal. Lids are everted, no foreign bodies appreciated.     Conjunctiva/sclera: Conjunctivae normal.     Pupils: Pupils are equal, round, and reactive to light.  Neck:     Thyroid: No thyroid mass or thyromegaly.     Vascular: No carotid bruit.     Trachea: Trachea and phonation normal.  Cardiovascular:     Rate and Rhythm: Normal rate and regular rhythm.     Pulses: Normal pulses.     Heart  sounds: Normal heart sounds, S1 normal and S2 normal. No murmur heard.    No gallop.  Pulmonary:     Effort: Pulmonary effort is normal. No respiratory distress.     Breath sounds: Normal breath sounds. No wheezing, rhonchi or rales.  Abdominal:     General: Bowel sounds are normal.     Palpations: Abdomen is soft.     Tenderness: There is no abdominal tenderness. There is no guarding or rebound.     Hernia: No hernia is present.  Musculoskeletal:     Cervical back: Normal range of motion and neck supple.  Skin:    General: Skin is warm and dry.     Findings: No rash.  Neurological:     Mental Status: He is alert.     Deep Tendon Reflexes: Reflexes are normal and symmetric.  Psychiatric:        Speech: Speech normal.        Behavior: Behavior normal.        Judgment: Judgment normal.       Results for orders placed or performed in visit on 10/14/21  Lipoprotein A (LPA)  Result Value Ref Range   Lipoprotein (a) 43 <75 nmol/L    Assessment and Plan  Viral URI with cough Assessment & Plan: This is likely viral infection. Low frequnecy of COVID in community at this time so not likely cause. Get rest and plenty of fluids Symptom care with mucinex  twice daily or Nyquil at night. You are most contagious when you have a  fever and in first 3 days of illness. Expect 7-10 days of illness, sometimes 14 days but you should be turning the corner, fever resolved after day 5-7 of illness. Call with new fever , new shrotness of breath or if not improving as expected per viral timeline.      No follow-ups on file.   Eliezer Lofts, MD

## 2022-03-30 NOTE — Assessment & Plan Note (Signed)
This is likely viral infection. Low frequnecy of COVID in community at this time so not likely cause. Get rest and plenty of fluids Symptom care with mucinex  twice daily or Nyquil at night. You are most contagious when you have a  fever and in first 3 days of illness. Expect 7-10 days of illness, sometimes 14 days but you should be turning the corner, fever resolved after day 5-7 of illness. Call with new fever , new shrotness of breath or if not improving as expected per viral timeline.

## 2022-04-18 DIAGNOSIS — Z86010 Personal history of colonic polyps: Secondary | ICD-10-CM | POA: Diagnosis not present

## 2022-04-18 DIAGNOSIS — K640 First degree hemorrhoids: Secondary | ICD-10-CM | POA: Diagnosis not present

## 2022-04-18 DIAGNOSIS — K6389 Other specified diseases of intestine: Secondary | ICD-10-CM | POA: Diagnosis not present

## 2022-04-18 DIAGNOSIS — K635 Polyp of colon: Secondary | ICD-10-CM | POA: Diagnosis not present

## 2022-04-18 DIAGNOSIS — D123 Benign neoplasm of transverse colon: Secondary | ICD-10-CM | POA: Diagnosis not present

## 2022-04-18 DIAGNOSIS — Z1211 Encounter for screening for malignant neoplasm of colon: Secondary | ICD-10-CM

## 2022-04-18 DIAGNOSIS — K5730 Diverticulosis of large intestine without perforation or abscess without bleeding: Secondary | ICD-10-CM | POA: Diagnosis not present

## 2022-04-26 ENCOUNTER — Telehealth: Payer: Self-pay

## 2022-04-26 NOTE — Telephone Encounter (Signed)
Message left for patient to return my call.  

## 2022-04-26 NOTE — Telephone Encounter (Signed)
Pt returned call to schedule colonoscopy please return call 

## 2022-04-26 NOTE — Telephone Encounter (Signed)
Pt returned your call to schedule his colonoscopy. He said it is it is difficult to reach him during the day. He said you could email him at [email or web address] if that would be easier. You may need to call later in the day.

## 2022-04-28 ENCOUNTER — Telehealth: Payer: Self-pay

## 2022-04-28 ENCOUNTER — Other Ambulatory Visit: Payer: Self-pay | Admitting: *Deleted

## 2022-04-28 DIAGNOSIS — Z1211 Encounter for screening for malignant neoplasm of colon: Secondary | ICD-10-CM

## 2022-04-28 MED ORDER — NA SULFATE-K SULFATE-MG SULF 17.5-3.13-1.6 GM/177ML PO SOLN: 1.0000 | Freq: Once | ORAL | 0 refills | Status: AC

## 2022-04-28 NOTE — Addendum Note (Signed)
Addended by: Tawnya Crook on: 04/28/2022 09:41 AM   Modules accepted: Orders

## 2022-04-28 NOTE — Telephone Encounter (Signed)
Insurance only show dental coverage. Left pt message to verify insurance .

## 2022-05-02 MED ORDER — PEG 3350-KCL-NABCB-NACL-NASULF 236 G PO SOLR: 4000.0000 mL | Freq: Once | ORAL | 0 refills | Status: AC

## 2022-05-02 NOTE — Addendum Note (Signed)
Addended by: Tawnya Crook on: 05/02/2022 10:56 AM   Modules accepted: Orders

## 2022-05-03 MED ORDER — NA SULFATE-K SULFATE-MG SULF 17.5-3.13-1.6 GM/177ML PO SOLN: 1.0000 | Freq: Once | ORAL | 0 refills | Status: AC

## 2022-05-03 NOTE — Addendum Note (Signed)
Addended by: Tawnya Crook on: 05/03/2022 10:06 AM   Modules accepted: Orders

## 2022-05-04 ENCOUNTER — Other Ambulatory Visit: Payer: Self-pay

## 2022-05-04 ENCOUNTER — Ambulatory Visit: Payer: BC Managed Care – PPO | Admitting: Anesthesiology

## 2022-05-04 ENCOUNTER — Encounter
Admission: RE | Disposition: A | Discharge status: Home / Self Care | Payer: Self-pay | Source: Home / Self Care | Attending: Gastroenterology

## 2022-05-04 ENCOUNTER — Ambulatory Visit
Admission: RE | Admit: 2022-05-04 | Discharge: 2022-05-04 | Disposition: A | Discharge status: Home / Self Care | Payer: BC Managed Care – PPO | Attending: Gastroenterology | Admitting: Gastroenterology

## 2022-05-04 ENCOUNTER — Encounter: Payer: Self-pay | Admitting: Gastroenterology

## 2022-05-04 DIAGNOSIS — Z1211 Encounter for screening for malignant neoplasm of colon: Secondary | ICD-10-CM

## 2022-05-04 DIAGNOSIS — M109 Gout, unspecified: Secondary | ICD-10-CM | POA: Diagnosis not present

## 2022-05-04 HISTORY — PX: COLONOSCOPY WITH PROPOFOL: SHX5780

## 2022-05-04 SURGERY — COLONOSCOPY WITH PROPOFOL
Anesthesia: General

## 2022-05-04 MED ORDER — PROPOFOL 500 MG/50ML IV EMUL
INTRAVENOUS | Status: DC | PRN
  Administered 2022-05-04: 100 ug/kg/min via INTRAVENOUS

## 2022-05-04 MED ORDER — LIDOCAINE HCL (CARDIAC) PF 100 MG/5ML IV SOSY
PREFILLED_SYRINGE | INTRAVENOUS | Status: DC | PRN
  Administered 2022-05-04: 50 mg via INTRAVENOUS

## 2022-05-04 MED ORDER — PROPOFOL 10 MG/ML IV BOLUS
INTRAVENOUS | Status: DC | PRN
  Administered 2022-05-04: 100 mg via INTRAVENOUS

## 2022-05-04 MED ORDER — LIDOCAINE HCL (PF) 2 % IJ SOLN
INTRAMUSCULAR | Status: AC
  Filled 2022-05-04: qty 5

## 2022-05-04 MED ORDER — SODIUM CHLORIDE 0.9 % IV SOLN: INTRAVENOUS | Status: DC

## 2022-05-04 MED ORDER — PROPOFOL 10 MG/ML IV BOLUS
INTRAVENOUS | Status: AC
  Filled 2022-05-04: qty 40

## 2022-05-04 MED ORDER — SIMETHICONE 40 MG/0.6ML PO SUSP
ORAL | Status: DC | PRN
  Administered 2022-05-04: 60 mL

## 2022-05-04 NOTE — Transfer of Care (Signed)
Immediate Anesthesia Transfer of Care Note  Patient: Lonnie Castro  Procedure(s) Performed: COLONOSCOPY WITH PROPOFOL  Patient Location: PACU and Endoscopy Unit  Anesthesia Type:General  Level of Consciousness: awake, oriented, and patient cooperative  Airway & Oxygen Therapy: Patient Spontanous Breathing  Post-op Assessment: Report given to RN and Post -op Vital signs reviewed and stable  Post vital signs: Reviewed and stable  Last Vitals:  Vitals Value Taken Time  BP 94/57 05/04/22 1001  Temp 36.2 C 05/04/22 1000  Pulse 55 05/04/22 1001  Resp 15 05/04/22 1001  SpO2 97 % 05/04/22 1001  Vitals shown include unvalidated device data.  Last Pain:  Vitals:   05/04/22 1000  TempSrc: Temporal  PainSc: 0-No pain         Complications: No notable events documented.

## 2022-05-04 NOTE — H&P (Signed)
Arlyss Repress, MD [address]  [address]  [place], [place] [address]  Main: [phone]  Fax: [phone] Pager: [phone]  Primary Care Physician:  Doreene Nest, NP Primary Gastroenterologist:  Dr. Arlyss Repress  Pre-Procedure History & Physical: HPI:  Lonnie Castro is a 57 y.o. male is here for an colonoscopy.   Past Medical History:  Diagnosis Date   Allergic rhinitis    Arthritis    Bronchitis 04/04/2021   Gout    Orchitis    Other fatigue 04/04/2021    Past Surgical History:  Procedure Laterality Date   COLONOSCOPY WITH PROPOFOL N/A 11/03/2014   Procedure: COLONOSCOPY WITH PROPOFOL;  Surgeon: Christena Deem, MD;  Location: [facility] ENDOSCOPY;  Service: Endoscopy;  Laterality: N/A;   FRACTURE SURGERY     HERNIA REPAIR  01/02/1982   NASAL SEPTUM SURGERY  01/02/1985    Prior to Admission medications   Medication Sig Start Date End Date Taking? Authorizing Provider  Febuxostat 80 MG TABS TAKE 1 TABLET BY MOUTH DAILY FOR GOUT PREVENTION 12/14/21  Yes Doreene Nest, NP  Cetirizine HCl (ZYRTEC ALLERGY PO) Take by mouth.    [provider]  Colchicine 0.6 MG CAPS Take by mouth.    [provider]  fluticasone (FLONASE) 50 MCG/ACT nasal spray SPRAY 2 SPRAYS INTO EACH NOSTRIL EVERY DAY 03/15/20   Doreene Nest, NP    Allergies as of 04/28/2022   (No Known Allergies)    Family History  Problem Relation Age of Onset   Cancer Father     Social History   Socioeconomic History   Marital status: Married    Spouse name: Not on file   Number of children: Not on file   Years of education: Not on file   Highest education level: Not on file  Occupational History   Not on file  Tobacco Use   Smoking status: Never   Smokeless tobacco: Never  Vaping Use   Vaping Use: Never used  Substance and Sexual Activity   Alcohol use: Yes    Alcohol/week: 4.0 standard drinks of alcohol    Types: 4 Standard drinks or equivalent per week    Drug use: No   Sexual activity: Not on file  Other Topics Concern   Not on file  Social History Narrative   Married.   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   Conservation officer, historic buildings.   Enjoys exercising, relaxing, flying, snowboarding.   Social Determinants of Health   Financial Resource Strain: Not on file  Food Insecurity: Not on file  Transportation Needs: Not on file  Physical Activity: Not on file  Stress: Not on file  Social Connections: Not on file  Intimate Partner Violence: Not on file    Review of Systems: See HPI, otherwise negative ROS  Physical Exam: BP (!) 140/118   Pulse (!) 52   Temp (!) 96.8 F (36 C) (Temporal)   Resp 16   Ht 5\' 10"  (1.778 m)   Wt 95.3 kg   SpO2 97%   BMI 30.13 kg/m  General:   Alert,  pleasant and cooperative in NAD Head:  Normocephalic and atraumatic. Neck:  Supple; no masses or thyromegaly. Lungs:  Clear throughout to auscultation.    Heart:  Regular rate and rhythm. Abdomen:  Soft, nontender and nondistended. Normal bowel sounds, without guarding, and without rebound.   Neurologic:  Alert and  oriented x4;  grossly normal neurologically.  Impression/Plan: Lonnie Castro is  here for an colonoscopy to be performed for colon cancer screening  Risks, benefits, limitations, and alternatives regarding  colonoscopy have been reviewed with the patient.  Questions have been answered.  All parties agreeable.   Lannette Donath, MD  05/04/2022, 9:02 AM

## 2022-05-04 NOTE — Anesthesia Preprocedure Evaluation (Addendum)
Anesthesia Evaluation  Patient identified by MRN, date of birth, ID band Patient awake    Reviewed: Allergy & Precautions, H&P , NPO status , Patient's Chart, lab work & pertinent test results  Airway Mallampati: II  TM Distance: >3 FB Neck ROM: full    Dental no notable dental hx. (+) Teeth Intact, Dental Advidsory Given   Pulmonary neg pulmonary ROS, neg shortness of breath   Pulmonary exam normal breath sounds clear to auscultation       Cardiovascular Exercise Tolerance: Good (-) angina (-) Past MI and (-) DOE negative cardio ROS Normal cardiovascular exam Rhythm:regular Rate:Normal     Neuro/Psych negative neurological ROS  negative psych ROS   GI/Hepatic negative GI ROS, Neg liver ROS,neg GERD  ,,  Endo/Other  negative endocrine ROS    Renal/GU negative Renal ROS  negative genitourinary   Musculoskeletal  (+) Arthritis ,    Abdominal   Peds  Hematology negative hematology ROS (+)   Anesthesia Other Findings Past Medical History:   Gout                                                         Allergic rhinitis                                            Orchitis                                                     Arthritis                                                   Past Surgical History:   HERNIA REPAIR                                    1984         NASAL SEPTUM SURGERY                             1987        BMI    Body Mass Index   33.28 kg/m 2    Signs and symptoms suggestive of sleep apnea    Reproductive/Obstetrics negative OB ROS                             Anesthesia Physical Anesthesia Plan  ASA: 2  Anesthesia Plan: General   Post-op Pain Management: Minimal or no pain anticipated   Induction: Intravenous  PONV Risk Score and Plan: 3 and Propofol infusion, TIVA and Ondansetron  Airway Management Planned: Nasal Cannula  Additional Equipment:  None  Intra-op Plan:   Post-operative Plan:   Informed Consent: I have reviewed the patients History and Physical, chart, labs and  discussed the procedure including the risks, benefits and alternatives for the proposed anesthesia with the patient or authorized representative who has indicated his/her understanding and acceptance.     Dental Advisory Given  Plan Discussed with: Anesthesiologist, CRNA and Surgeon  Anesthesia Plan Comments: (Discussed risks of anesthesia with patient, including possibility of difficulty with spontaneous ventilation under anesthesia necessitating airway intervention, PONV, and rare risks such as cardiac or respiratory or neurological events, and allergic reactions. Discussed the role of CRNA in patient's perioperative care. Patient understands.)        Anesthesia Quick Evaluation

## 2022-05-04 NOTE — Anesthesia Postprocedure Evaluation (Signed)
Anesthesia Post Note  Patient: Lonnie Castro  Procedure(s) Performed: COLONOSCOPY WITH PROPOFOL  Patient location during evaluation: Endoscopy Anesthesia Type: General Level of consciousness: awake and alert Pain management: pain level controlled Vital Signs Assessment: post-procedure vital signs reviewed and stable Respiratory status: spontaneous breathing, nonlabored ventilation, respiratory function stable and patient connected to nasal cannula oxygen Cardiovascular status: blood pressure returned to baseline and stable Postop Assessment: no apparent nausea or vomiting Anesthetic complications: no  No notable events documented.   Last Vitals:  Vitals:   05/04/22 0853 05/04/22 1000  BP: (!) 140/118 (!) 94/57  Pulse: (!) 52 (!) 56  Resp: 16 16  Temp: (!) 36 C (!) 36.2 C  SpO2: 97% 98%    Last Pain:  Vitals:   05/04/22 1000  TempSrc: Temporal  PainSc: 0-No pain                 [employer]

## 2022-05-04 NOTE — Op Note (Signed)
[facility] Gastroenterology Patient Name: Lonnie Castro Procedure Date: 05/04/2022 8:47 AM MRN: [id number] Account #: [id number] Date of Birth: 02/24/1965 Admit Type: Outpatient Age: 57 Room: [facility] ENDO ROOM 3 Gender: Male Note Status: Finalized Instrument Name: Colonoscope [id number] Procedure:             Colonoscopy Indications:           Screening for colorectal malignant neoplasm Providers:             Toney Reil MD, MD Referring MD:          Doreene Nest (Referring MD) Medicines:             General Anesthesia Complications:         No immediate complications. Estimated blood loss: None. Procedure:             Pre-Anesthesia Assessment:                        - Prior to the procedure, a History and Physical was                         performed, and patient medications and allergies were                         reviewed. The patient is competent. The risks and                         benefits of the procedure and the sedation options and                         risks were discussed with the patient. All questions                         were answered and informed consent was obtained.                         Patient identification and proposed procedure were                         verified by the physician, the nurse, the                         anesthesiologist, the anesthetist and the technician                         in the pre-procedure area in the procedure room in the                         endoscopy suite. Mental Status Examination: alert and                         oriented. Airway Examination: normal oropharyngeal                         airway and neck mobility. Respiratory Examination:                         clear to auscultation. CV Examination: normal.  Prophylactic Antibiotics: The patient does not require                         prophylactic antibiotics. Prior Anticoagulants: The                          patient has taken no anticoagulant or antiplatelet                         agents. ASA Grade Assessment: II - A patient with mild                         systemic disease. After reviewing the risks and                         benefits, the patient was deemed in satisfactory                         condition to undergo the procedure. The anesthesia                         plan was to use general anesthesia. Immediately prior                         to administration of medications, the patient was                         re-assessed for adequacy to receive sedatives. The                         heart rate, respiratory rate, oxygen saturations,                         blood pressure, adequacy of pulmonary ventilation, and                         response to care were monitored throughout the                         procedure. The physical status of the patient was                         re-assessed after the procedure.                        After obtaining informed consent, the colonoscope was                         passed under direct vision. Throughout the procedure,                         the patient's blood pressure, pulse, and oxygen                         saturations were monitored continuously. The                         Colonoscope was introduced through the anus and  advanced to the the terminal ileum, with                         identification of the appendiceal orifice and IC                         valve. The colonoscopy was performed without                         difficulty. The patient tolerated the procedure well.                         The quality of the bowel preparation was evaluated                         using the BBPS [facility] Bowel Preparation Scale) with                         scores of: Right Colon = 3, Transverse Colon = 3 and                         Left Colon = 3 (entire mucosa seen well with no                         residual  staining, small fragments of stool or opaque                         liquid). The total BBPS score equals 9. The terminal                         ileum, ileocecal valve, appendiceal orifice, and                         rectum were photographed. Findings:      The perianal and digital rectal examinations were normal. Pertinent       negatives include normal sphincter tone and no palpable rectal lesions.      The terminal ileum appeared normal.      The entire examined colon appeared normal.      The retroflexed view of the distal rectum and anal verge was normal and       showed no anal or rectal abnormalities. Impression:            - The examined portion of the ileum was normal.                        - The entire examined colon is normal.                        - The distal rectum and anal verge are normal on                         retroflexion view.                        - No specimens collected. Recommendation:        - Discharge patient to home (with escort).                        -  Resume previous diet today.                        - Continue present medications.                        - Repeat colonoscopy in 10 years for screening                         purposes. Procedure Code(s):     --- Professional ---                        R6045, Colorectal cancer screening; colonoscopy on                         individual not meeting criteria for high risk Diagnosis Code(s):     --- Professional ---                        Z12.11, Encounter for screening for malignant neoplasm                         of colon CPT copyright 2022 American Medical Association. All rights reserved. The codes documented in this report are preliminary and upon coder review may  be revised to meet current compliance requirements. Dr. Libby Maw Toney Reil MD, MD 05/04/2022 9:59:22 AM This report has been signed electronically. Number of Addenda: 0 Note Initiated On: 05/04/2022 8:47 AM Scope  Withdrawal Time: 0 hours 7 minutes 52 seconds  Total Procedure Duration: 0 hours 13 minutes 39 seconds  Estimated Blood Loss:  Estimated blood loss: none.      [facility]

## 2022-05-05 ENCOUNTER — Encounter: Payer: Self-pay | Admitting: Gastroenterology

## 2022-05-10 ENCOUNTER — Encounter: Payer: Self-pay | Admitting: Gastroenterology

## 2022-05-16 DIAGNOSIS — L578 Other skin changes due to chronic exposure to nonionizing radiation: Secondary | ICD-10-CM | POA: Diagnosis not present

## 2022-05-16 DIAGNOSIS — Z86018 Personal history of other benign neoplasm: Secondary | ICD-10-CM | POA: Diagnosis not present

## 2022-05-16 DIAGNOSIS — Z85820 Personal history of malignant melanoma of skin: Secondary | ICD-10-CM | POA: Diagnosis not present

## 2022-05-16 DIAGNOSIS — L821 Other seborrheic keratosis: Secondary | ICD-10-CM | POA: Diagnosis not present

## 2022-05-25 DIAGNOSIS — Z1231 Encounter for screening mammogram for malignant neoplasm of breast: Secondary | ICD-10-CM | POA: Diagnosis not present

## 2022-06-15 ENCOUNTER — Other Ambulatory Visit: Payer: Self-pay | Admitting: Primary Care

## 2022-06-15 DIAGNOSIS — M1A9XX0 Chronic gout, unspecified, without tophus (tophi): Secondary | ICD-10-CM

## 2022-06-16 NOTE — Telephone Encounter (Signed)
Lvm for patient tcb and schedule 

## 2022-06-16 NOTE — Telephone Encounter (Signed)
Patient is due for CPE/follow up in early October, this will be required prior to any further refills.  Please schedule, thank you!

## 2022-08-01 DIAGNOSIS — Z13220 Encounter for screening for lipoid disorders: Secondary | ICD-10-CM | POA: Diagnosis not present

## 2022-08-01 DIAGNOSIS — Z79899 Other long term (current) drug therapy: Secondary | ICD-10-CM | POA: Diagnosis not present

## 2022-08-01 DIAGNOSIS — Z0000 Encounter for general adult medical examination without abnormal findings: Secondary | ICD-10-CM | POA: Diagnosis not present

## 2022-08-08 ENCOUNTER — Other Ambulatory Visit: Payer: Self-pay | Admitting: Primary Care

## 2022-08-08 DIAGNOSIS — Z13220 Encounter for screening for lipoid disorders: Secondary | ICD-10-CM | POA: Diagnosis not present

## 2022-08-08 DIAGNOSIS — Z79899 Other long term (current) drug therapy: Secondary | ICD-10-CM | POA: Diagnosis not present

## 2022-08-08 DIAGNOSIS — M1A9XX0 Chronic gout, unspecified, without tophus (tophi): Secondary | ICD-10-CM

## 2022-08-09 DIAGNOSIS — M1A9XX0 Chronic gout, unspecified, without tophus (tophi): Secondary | ICD-10-CM

## 2022-08-24 MED ORDER — FEBUXOSTAT 80 MG PO TABS: ORAL_TABLET | ORAL | 0 refills | Status: DC

## 2022-09-14 DIAGNOSIS — L821 Other seborrheic keratosis: Secondary | ICD-10-CM | POA: Diagnosis not present

## 2022-09-14 DIAGNOSIS — L298 Other pruritus: Secondary | ICD-10-CM | POA: Diagnosis not present

## 2022-10-03 DIAGNOSIS — B078 Other viral warts: Secondary | ICD-10-CM | POA: Diagnosis not present

## 2022-10-04 ENCOUNTER — Ambulatory Visit: Payer: BC Managed Care – PPO | Admitting: Primary Care

## 2022-10-04 ENCOUNTER — Encounter: Payer: Self-pay | Admitting: Primary Care

## 2022-10-04 VITALS — BP 106/56 | HR 65 | Temp 97.6°F | Ht 70.0 in | Wt 208.0 lb

## 2022-10-04 DIAGNOSIS — E782 Mixed hyperlipidemia: Secondary | ICD-10-CM

## 2022-10-04 DIAGNOSIS — Z0000 Encounter for general adult medical examination without abnormal findings: Secondary | ICD-10-CM

## 2022-10-04 DIAGNOSIS — Z23 Encounter for immunization: Secondary | ICD-10-CM

## 2022-10-04 DIAGNOSIS — M1A9XX0 Chronic gout, unspecified, without tophus (tophi): Secondary | ICD-10-CM | POA: Diagnosis not present

## 2022-10-04 DIAGNOSIS — Z125 Encounter for screening for malignant neoplasm of prostate: Secondary | ICD-10-CM

## 2022-10-04 LAB — HEMOGLOBIN A1C: Hgb A1c MFr Bld: 5.3 % (ref 4.6–6.5)

## 2022-10-04 LAB — COMPREHENSIVE METABOLIC PANEL
ALT: 16 U/L (ref 0–53)
AST: 21 U/L (ref 0–37)
Albumin: 4.3 g/dL (ref 3.5–5.2)
Alkaline Phosphatase: 48 U/L (ref 39–117)
BUN: 20 mg/dL (ref 6–23)
CO2: 30 meq/L (ref 19–32)
Calcium: 9.1 mg/dL (ref 8.4–10.5)
Chloride: 104 meq/L (ref 96–112)
Creatinine, Ser: 1.17 mg/dL (ref 0.40–1.50)
GFR: 68.73 mL/min (ref >60.00)
Glucose, Bld: 80 mg/dL (ref 70–99)
Potassium: 4.1 meq/L (ref 3.5–5.1)
Sodium: 139 meq/L (ref 135–145)
Total Bilirubin: 0.8 mg/dL (ref 0.2–1.2)
Total Protein: 6.2 g/dL (ref 6.0–8.3)

## 2022-10-04 LAB — LIPID PANEL
Cholesterol: 194 mg/dL (ref 0–200)
HDL: 48.5 mg/dL (ref >39.00)
LDL Cholesterol: 118 mg/dL — ABNORMAL HIGH (ref 0–99)
NonHDL: 145.95
Total CHOL/HDL Ratio: 4
Triglycerides: 140 mg/dL (ref 0.0–149.0)
VLDL: 28 mg/dL (ref 0.0–40.0)

## 2022-10-04 LAB — URIC ACID: Uric Acid, Serum: 2.7 mg/dL — ABNORMAL LOW (ref 4.0–7.8)

## 2022-10-04 LAB — PSA: PSA: 0.18 ng/mL (ref 0.10–4.00)

## 2022-10-04 MED ORDER — COLCHICINE 0.6 MG PO CAPS
ORAL_CAPSULE | ORAL | 0 refills | Status: AC
Start: 2022-10-04 — End: ?

## 2022-10-04 NOTE — Assessment & Plan Note (Signed)
Controlled.  Continue Febuxostat 80 mg daily for prevention. Allopurinol caused AKI.  Colchicine 0.6 mg PRN, refill provided.   Uric acid level pending.

## 2022-10-04 NOTE — Assessment & Plan Note (Signed)
Immunizations UTD. Influenza vaccine provided today.  Colonoscopy UTD, due 2034 PSA due and pending.  Discussed the importance of a healthy diet and regular exercise in order for weight loss, and to reduce the risk of further co-morbidity.  Exam stable. Labs pending.  Follow up in 1 year for repeat physical.

## 2022-10-04 NOTE — Assessment & Plan Note (Signed)
Commended him on regular exercise. Repeat lipid panel pending.

## 2022-10-04 NOTE — Progress Notes (Signed)
Subjective:    Patient ID: Lonnie Castro, male    DOB: [birth year], 57 y.o.   MRN: [id number]  HPI  Lonnie Castro is a very pleasant 57 y.o. male who presents today for complete physical and follow up of chronic conditions.  Immunizations: -Tetanus: Completed in 2016 -Influenza: Influenza vaccine provided today. -Shingles: Completed Shingrix series  Diet: Fair diet.  Exercise: Exercising 5-6 days weekly in the gym  Eye exam: Completes annually  Dental exam: Completes semi-annually    Colonoscopy: Completed in May 2024, due 2034  PSA: Due   BP Readings from Last 3 Encounters:  10/04/22 (!) 106/56  05/04/22 (!) 113/40  03/30/22 110/80       Review of Systems  Constitutional:  Negative for unexpected weight change.  HENT:  Negative for rhinorrhea.   Respiratory:  Negative for cough and shortness of breath.   Cardiovascular:  Negative for chest pain.  Gastrointestinal:  Negative for constipation and diarrhea.  Genitourinary:  Negative for difficulty urinating.  Musculoskeletal:  Negative for arthralgias and myalgias.  Skin:  Negative for rash.  Allergic/Immunologic: Negative for environmental allergies.  Neurological:  Negative for dizziness and headaches.  Psychiatric/Behavioral:  The patient is not nervous/anxious.          Past Medical History:  Diagnosis Date   Allergic rhinitis    Arthritis    Bronchitis 04/04/2021   Gout    Orchitis    Other fatigue 04/04/2021    Social History   Socioeconomic History   Marital status: Married    Spouse name: Not on file   Number of children: Not on file   Years of education: Not on file   Highest education level: Not on file  Occupational History   Not on file  Tobacco Use   Smoking status: Never   Smokeless tobacco: Never  Vaping Use   Vaping status: Never Used  Substance and Sexual Activity   Alcohol use: Yes    Alcohol/week: 4.0 standard drinks of alcohol    Types: 4 Standard drinks or equivalent per week    Drug use: No   Sexual activity: Not on file  Other Topics Concern   Not on file  Social History Narrative   Married.   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   Conservation officer, historic buildings.   Enjoys exercising, relaxing, flying, snowboarding.   Social Determinants of Health   Financial Resource Strain: Not on file  Food Insecurity: Not on file  Transportation Needs: Not on file  Physical Activity: Not on file  Stress: Not on file  Social Connections: Not on file  Intimate Partner Violence: Not on file    Past Surgical History:  Procedure Laterality Date   COLONOSCOPY WITH PROPOFOL N/A 11/03/2014   Procedure: COLONOSCOPY WITH PROPOFOL;  Surgeon: Christena Deem, MD;  Location: [facility] ENDOSCOPY;  Service: Endoscopy;  Laterality: N/A;   COLONOSCOPY WITH PROPOFOL N/A 05/04/2022   Procedure: COLONOSCOPY WITH PROPOFOL;  Surgeon: Toney Reil, MD;  Location: [facility] ENDOSCOPY;  Service: Gastroenterology;  Laterality: N/A;   FRACTURE SURGERY     HERNIA REPAIR  01/02/1982   NASAL SEPTUM SURGERY  01/02/1985    Family History  Problem Relation Age of Onset   Cancer Father     No Known Allergies  Current Outpatient Medications on File Prior to Visit  Medication Sig Dispense Refill   Cetirizine HCl (ZYRTEC ALLERGY PO) Take by mouth.     Febuxostat 80 MG TABS TAKE 1 TABLET BY  MOUTH DAILY FOR GOUT PREVENTION 90 tablet 0   fluticasone (FLONASE) 50 MCG/ACT nasal spray SPRAY 2 SPRAYS INTO EACH NOSTRIL EVERY DAY 48 mL 2   No current facility-administered medications on file prior to visit.    BP (!) 106/56   Pulse 65   Temp 97.6 F (36.4 C) (Temporal)   Ht 5\' 10"  (1.778 m)   Wt 208 lb (94.3 kg)   SpO2 96%   BMI 29.84 kg/m  Objective:   Physical Exam HENT:     Right Ear: Tympanic membrane and ear canal normal.     Left Ear: Tympanic membrane and ear canal normal.  Eyes:     Pupils: Pupils are equal, round, and reactive to light.  Cardiovascular:     Rate and  Rhythm: Normal rate and regular rhythm.  Pulmonary:     Effort: Pulmonary effort is normal.     Breath sounds: Normal breath sounds.  Abdominal:     General: Bowel sounds are normal.     Palpations: Abdomen is soft.     Tenderness: There is no abdominal tenderness.  Musculoskeletal:        General: Normal range of motion.     Cervical back: Neck supple.  Skin:    General: Skin is warm and dry.  Neurological:     Mental Status: He is alert and oriented to person, place, and time.     Cranial Nerves: No cranial nerve deficit.     Deep Tendon Reflexes:     Reflex Scores:      Patellar reflexes are 2+ on the right side and 2+ on the left side. Psychiatric:        Mood and Affect: Mood normal.           Assessment & Plan:  Screening for prostate cancer -     PSA  Mixed hyperlipidemia Assessment & Plan: Commended him on regular exercise. Repeat lipid panel pending.  Orders: -     Lipid panel -     Hemoglobin A1c -     Comprehensive metabolic panel  Chronic gout without tophus, unspecified cause, unspecified site Assessment & Plan: Controlled.  Continue Febuxostat 80 mg daily for prevention. Allopurinol caused AKI.  Colchicine 0.6 mg PRN, refill provided.   Uric acid level pending.   Orders: -     Colchicine; Take 2 tablets by mouth at gout onset, repeat with 1 pill 2 hours later, then take 1 tablet twice daily until flare resolves.  Dispense: 60 capsule; Refill: 0 -     Uric acid  Preventative health care Assessment & Plan: Immunizations UTD. Influenza vaccine provided today.  Colonoscopy UTD, due 2034 PSA due and pending.  Discussed the importance of a healthy diet and regular exercise in order for weight loss, and to reduce the risk of further co-morbidity.  Exam stable. Labs pending.  Follow up in 1 year for repeat physical.          Doreene Nest, NP

## 2022-10-04 NOTE — Patient Instructions (Signed)
Stop by the lab prior to leaving today. I will notify you of your results once received.   It was a pleasure to see you today!  

## 2022-10-06 DIAGNOSIS — H524 Presbyopia: Secondary | ICD-10-CM | POA: Diagnosis not present

## 2022-10-25 ENCOUNTER — Ambulatory Visit: Payer: BC Managed Care – PPO | Admitting: Family Medicine

## 2022-10-25 ENCOUNTER — Encounter: Payer: Self-pay | Admitting: Family Medicine

## 2022-10-25 VITALS — BP 110/60 | HR 72 | Temp 98.3°F | Ht 70.0 in | Wt 215.0 lb

## 2022-10-25 DIAGNOSIS — R519 Headache, unspecified: Secondary | ICD-10-CM

## 2022-10-25 DIAGNOSIS — J3489 Other specified disorders of nose and nasal sinuses: Secondary | ICD-10-CM | POA: Diagnosis not present

## 2022-10-25 LAB — POC COVID19 BINAXNOW: SARS Coronavirus 2 Ag: NEGATIVE

## 2022-10-25 MED ORDER — AMOXICILLIN-POT CLAVULANATE 875-125 MG PO TABS
1.0000 | ORAL_TABLET | Freq: Two times a day (BID) | ORAL | 0 refills | Status: AC
Start: 2022-10-25 — End: ?

## 2022-10-25 NOTE — Progress Notes (Unsigned)
Lonnie Dascenzo T. Dennys Traughber, MD, CAQ Sports Medicine [facility] at [facility] [address] [place], [address]  Phone: [phone]  FAX: [phone]  Lonnie Castro - 57 y.o. male  MRN [id number]  Date of Birth: 1965-04-19  Date: 10/25/2022  PCP: Doreene Nest, NP  Referral: Doreene Nest, NP  Chief Complaint  Patient presents with   Headache    ? Thinks he has a sinus infection   Subjective:   Lonnie Castro is a 57 y.o. very pleasant male patient with Body mass index is 30.85 kg/m. who presents with the following:  Facial pain and sinus pressure / congestion.  For the last couple of days he has had some sinus pressure and pain as well as congestion in his frontal sinuses predominantly.  Today, he feels better than he did comparatively to yesterday.  HA for a few days Sinus infection for about one each year  Barely runny nose and sore throat No cough No fever No nausea vomiting diarrhea   Review of Systems is noted in the HPI, as appropriate  Objective:   BP 110/60 (BP Location: Right Arm, Patient Position: Sitting, Cuff Size: Large)   Pulse 72   Temp 98.3 F (36.8 C) (Temporal)   Ht 5\' 10"  (1.778 m)   Wt 215 lb (97.5 kg)   SpO2 98%   BMI 30.85 kg/m    Gen: WDWN, NAD; alert,appropriate and cooperative throughout exam  HEENT: Normocephalic and atraumatic. Throat clear, w/o exudate, no LAD, R TM clear, L TM - good landmarks, No fluid present. rhinnorhea.  Left frontal and maxillary sinuses: Non-Tender Right frontal and maxillary sinuses: Non-Tender  Neck: No ant or post LAD CV: RRR, No M/G/R Pulm: Breathing comfortably in no resp distress. no w/c/r Psych: full affect, pleasant   Laboratory and Imaging Data: Results for orders placed or performed in visit on 10/25/22  POC COVID-19  Result Value Ref Range   SARS Coronavirus 2 Ag Negative Negative     Assessment and Plan:     ICD-10-CM   1. Sinus pressure  J34.89     2.  Acute intractable headache, unspecified headache type  R51.9 POC COVID-19     Sinus pressure and pain which seems little bit better today compared to yesterday.  I am going to have him discontinue with some supportive care.  If his symptoms worsen consolidate more into facial pain or headache, I did have him some antibiotics that he can pick up if needed.  For now he is going to hold.  Medication Management during today's office visit: Meds ordered this encounter  Medications   amoxicillin-clavulanate (AUGMENTIN) 875-125 MG tablet    Sig: Take 1 tablet by mouth 2 (two) times daily.    Dispense:  20 tablet    Refill:  0   There are no discontinued medications.  Orders placed today for conditions managed today: Orders Placed This Encounter  Procedures   POC COVID-19    Disposition: No follow-ups on file.  [employer] One speech-to-text software was used for transcription in this dictation.  Possible transcriptional errors can occur using Animal nutritionist.   Signed,  Elpidio Galea. Moni Rothrock, MD   Outpatient Encounter Medications as of 10/25/2022  Medication Sig   amoxicillin-clavulanate (AUGMENTIN) 875-125 MG tablet Take 1 tablet by mouth 2 (two) times daily.   Cetirizine HCl (ZYRTEC ALLERGY PO) Take by mouth.   Colchicine 0.6 MG CAPS Take 2 tablets by mouth at gout onset, repeat  with 1 pill 2 hours later, then take 1 tablet twice daily until flare resolves.   Febuxostat 80 MG TABS TAKE 1 TABLET BY MOUTH DAILY FOR GOUT PREVENTION   fluticasone (FLONASE) 50 MCG/ACT nasal spray SPRAY 2 SPRAYS INTO EACH NOSTRIL EVERY DAY   No facility-administered encounter medications on file as of 10/25/2022.

## 2022-11-18 DIAGNOSIS — K130 Diseases of lips: Secondary | ICD-10-CM | POA: Diagnosis not present

## 2022-11-18 DIAGNOSIS — K120 Recurrent oral aphthae: Secondary | ICD-10-CM | POA: Diagnosis not present

## 2023-01-29 ENCOUNTER — Other Ambulatory Visit: Payer: Self-pay | Admitting: Primary Care

## 2023-01-29 ENCOUNTER — Telehealth: Payer: Self-pay | Admitting: Pharmacist

## 2023-01-29 ENCOUNTER — Other Ambulatory Visit (HOSPITAL_COMMUNITY): Payer: Self-pay

## 2023-01-29 DIAGNOSIS — M1A9XX0 Chronic gout, unspecified, without tophus (tophi): Secondary | ICD-10-CM

## 2023-01-29 NOTE — Telephone Encounter (Signed)
Pharmacy Patient Advocate Encounter   Received notification from Patient Pharmacy that prior authorization for Febuxostat 80MG  tablets is required/requested.   Insurance verification completed.   The patient is insured through [facility] .   Per test claim: PA required; PA submitted to above mentioned insurance via CoverMyMeds Key/confirmation #/EOC [id number] Status is pending

## 2023-01-30 NOTE — Telephone Encounter (Signed)
Pharmacy Patient Advocate Encounter  Received notification from [facility] that Prior Authorization for Febuxostat 80MG  tablets has been APPROVED from 01/29/2023 to 01/29/2024   PA #/Case ID/Reference #: [id number]  Pharmacy notified.

## 2023-05-03 DIAGNOSIS — M25551 Pain in right hip: Secondary | ICD-10-CM | POA: Diagnosis not present

## 2023-05-23 DIAGNOSIS — M25511 Pain in right shoulder: Secondary | ICD-10-CM | POA: Diagnosis not present

## 2023-05-29 DIAGNOSIS — Z1231 Encounter for screening mammogram for malignant neoplasm of breast: Secondary | ICD-10-CM | POA: Diagnosis not present

## 2023-06-04 DIAGNOSIS — B078 Other viral warts: Secondary | ICD-10-CM | POA: Diagnosis not present

## 2023-06-04 DIAGNOSIS — D485 Neoplasm of uncertain behavior of skin: Secondary | ICD-10-CM | POA: Diagnosis not present

## 2023-06-06 DIAGNOSIS — L821 Other seborrheic keratosis: Secondary | ICD-10-CM | POA: Diagnosis not present

## 2023-06-06 DIAGNOSIS — L578 Other skin changes due to chronic exposure to nonionizing radiation: Secondary | ICD-10-CM | POA: Diagnosis not present

## 2023-06-06 DIAGNOSIS — Z872 Personal history of diseases of the skin and subcutaneous tissue: Secondary | ICD-10-CM | POA: Diagnosis not present

## 2023-06-19 ENCOUNTER — Other Ambulatory Visit: Payer: Self-pay

## 2023-06-19 ENCOUNTER — Encounter (HOSPITAL_BASED_OUTPATIENT_CLINIC_OR_DEPARTMENT_OTHER): Payer: Self-pay | Admitting: Orthopaedic Surgery

## 2023-06-19 DIAGNOSIS — M25511 Pain in right shoulder: Secondary | ICD-10-CM | POA: Diagnosis not present

## 2023-06-20 NOTE — H&P (Signed)
 PREOPERATIVE H&P  Chief Complaint: Articular cartilage disorder of shoulder, right, Impingement syndrome of right shoulder, Bicipital tendinitis of right shoulder, Strain of tendon of right rotator cuff,  HPI: Lonnie Castro is a 58 y.o. male who is scheduled for, Procedure(s): ARTHROSCOPY, SHOULDER, WITH ROTATOR CUFF REPAIR.   Patient has tried and failed injections and physical therapy for his right arm. He is unhappy with his shoulder function.   Symptoms are rated as moderate to severe, and have been worsening.  This is significantly impairing activities of daily living.    Please see clinic note for further details on this patient's care.    He has elected for surgical management.   Past Medical History:  Diagnosis Date   Allergic rhinitis    Arthritis    Bronchitis 04/04/2021   Gout    Orchitis    Other fatigue 04/04/2021   Past Surgical History:  Procedure Laterality Date   COLONOSCOPY WITH PROPOFOL  N/A 11/03/2014   Procedure: COLONOSCOPY WITH PROPOFOL ;  Surgeon: Deveron Fly, MD;  Location: [facility] ENDOSCOPY;  Service: Endoscopy;  Laterality: N/A;   COLONOSCOPY WITH PROPOFOL  N/A 05/04/2022   Procedure: COLONOSCOPY WITH PROPOFOL ;  Surgeon: Selena Daily, MD;  Location: [facility] ENDOSCOPY;  Service: Gastroenterology;  Laterality: N/A;   FRACTURE SURGERY     HERNIA REPAIR  01/02/1982   NASAL SEPTUM SURGERY  01/02/1985   Social History   Socioeconomic History   Marital status: Married    Spouse name: Not on file   Number of children: Not on file   Years of education: Not on file   Highest education level: Not on file  Occupational History   Not on file  Tobacco Use   Smoking status: Never   Smokeless tobacco: Never  Vaping Use   Vaping status: Never Used  Substance and Sexual Activity   Alcohol use: Yes    Alcohol/week: 4.0 standard drinks of alcohol    Types: 4 Standard drinks or equivalent per week    Comment: social   Drug use: No   Sexual activity:  Yes  Other Topics Concern   Not on file  Social History Narrative   Married.   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   Conservation officer, historic buildings.   Enjoys exercising, relaxing, flying, snowboarding.   Social Drivers of Corporate investment banker Strain: Not on file  Food Insecurity: Not on file  Transportation Needs: Not on file  Physical Activity: Not on file  Stress: Not on file  Social Connections: Not on file   Family History  Problem Relation Age of Onset   Cancer Father    No Known Allergies Prior to Admission medications   Medication Sig Start Date End Date Taking? Authorizing Provider  Febuxostat  80 MG TABS TAKE ONE TABLET BY MOUTH EVERY DAY FOR GOUT PREVENTION 01/29/23  Yes Clark, Katherine K, NP  fluticasone  (FLONASE ) 50 MCG/ACT nasal spray SPRAY 2 SPRAYS INTO EACH NOSTRIL EVERY DAY 03/15/20  Yes Clark, Katherine K, NP  loratadine (CLARITIN) 10 MG tablet Take 10 mg by mouth daily.   Yes [provider]  Colchicine  0.6 MG CAPS Take 2 tablets by mouth at gout onset, repeat with 1 pill 2 hours later, then take 1 tablet twice daily until flare resolves. 10/04/22   Gabriel John, NP    ROS: All other systems have been reviewed and were otherwise negative with the exception of those mentioned in the HPI and as above.  Physical Exam:  General: Alert, no acute distress Cardiovascular: No pedal edema Respiratory: No cyanosis, no use of accessory musculature GI: No organomegaly, abdomen is soft and non-tender Skin: No lesions in the area of chief complaint Neurologic: Sensation intact distally Psychiatric: Patient is competent for consent with normal mood and affect Lymphatic: No axillary or cervical lymphadenopathy  MUSCULOSKELETAL:  Right shoulder: active forward elevation to 90 degrees. Passive to 170. Cuff strength is weak with supraspinatus and infraspinatus testing. Positive O'Briens. Positive impingement. Negative AC tenderness to palpation.    Imaging: MRI demonstrates high grade near complete tear of the supraspinatus and infraspinatus. Fluid around the biceps. Lateral hanging acromial spur. Mild AC arthritis with edema in the distal clavicle.   Assessment: Articular cartilage disorder of shoulder, right, Impingement syndrome of right shoulder, Bicipital tendinitis of right shoulder, Strain of tendon of right rotator cuff,  Plan: Plan for Procedure(s): ARTHROSCOPY, SHOULDER, WITH ROTATOR CUFF REPAIR  The risks benefits and alternatives were discussed with the patient including but not limited to the risks of nonoperative treatment, versus surgical intervention including infection, bleeding, nerve injury,  blood clots, cardiopulmonary complications, morbidity, mortality, among others, and they were willing to proceed.   The patient acknowledged the explanation, agreed to proceed with the plan and consent was signed.   Operative Plan: Right shoulder scope with SAD, BT, RCR Discharge Medications: standard DVT Prophylaxis: none Physical Therapy: outpatient PT Special Discharge needs: Sling. IceMan   Adine Ahmadi, PA-C  06/20/2023 11:20 AM

## 2023-06-20 NOTE — Discharge Instructions (Addendum)
 Grafton Lawrence MD, MPH Nicholas Bari, PA-C [facility] [address] [phone] (tel)   [phone] (fax)   POST-OPERATIVE INSTRUCTIONS - SHOULDER ARTHROSCOPY  WOUND CARE You may remove the Operative Dressing on Post-Op Day #3 (72hrs after surgery).   Alternatively if you would like you can leave dressing on until follow-up if within 7-8 days but keep it dry. Leave steri-strips in place until they fall off on their own, usually 2 weeks postop. There may be a small amount of fluid/bleeding leaking at the surgical site.  This is normal; the shoulder is filled with fluid during the procedure and can leak for 24-48hrs after surgery.  You may change/reinforce the bandage as needed.  Use the Cryocuff or Ice as often as possible for the first 7 days, then as needed for pain relief. Always keep a towel, ACE wrap or other barrier between the cooling unit and your skin.  You may shower on Post-Op Day #3. Gently pat the area dry.  Do not soak the shoulder in water or submerge it.  Keep incisions as dry as possible. Do not go swimming in the pool or ocean until 4 weeks after surgery or when otherwise instructed.    EXERCISES Wear the sling at all times  You may remove the sling for showering, but keep the arm across the chest or in a secondary sling.     It is normal for your fingers/hand to become more swollen after surgery and discolored from bruising.   This will resolve over the first few weeks usually after surgery. Please continue to ambulate and do not stay sitting or lying for too long.  Perform foot and wrist pumps to assist in circulation.  PHYSICAL THERAPY - You will begin physical therapy soon after surgery (unless otherwise specified) - Please call to set up an appointment, if you do not already have one  - A PT referral was sent to [facility] in [place]  REGIONAL ANESTHESIA (NERVE BLOCKS) The anesthesia team may have performed a  nerve block for you this is a great tool used to minimize pain.   The block may start wearing off overnight (between 8-24 hours postop) When the block wears off, your pain may go from nearly zero to the pain you would have had postop without the block. This is an abrupt transition but nothing dangerous is happening.   This can be a challenging period but utilize your as needed pain medications to try and manage this period. We suggest you use the pain medication the first night prior to going to bed, to ease this transition.  You may take an extra dose of narcotic when this happens if needed  POST-OP MEDICATIONS- Multimodal approach to pain control In general your pain will be controlled with a combination of substances.  Prescriptions unless otherwise discussed are electronically sent to your pharmacy.  This is a carefully made plan we use to minimize narcotic use.     Celebrex - Anti-inflammatory medication taken on a scheduled basis Acetaminophen - Non-narcotic pain medicine taken on a scheduled basis  Oxycodone - This is a strong narcotic, to be used only on an "as needed" basis for SEVERE pain. Zofran - take as needed for nausea   FOLLOW-UP If you develop a Fever (>=101.5), Redness or Drainage from the surgical incision site, please call our office to arrange for an evaluation. Please call the office to schedule a follow-up appointment for your first post-operative appointment, 7-10  days post-operatively.    HELPFUL INFORMATION   You may be more comfortable sleeping in a semi-seated position the first few nights following surgery.  Keep a pillow propped under the elbow and forearm for comfort.  If you have a recliner type of chair it might be beneficial.  If not that is fine too, but it would be helpful to sleep propped up with pillows behind your operated shoulder as well under your elbow and forearm.  This will reduce pulling on the suture lines.  When dressing, put your operative  arm in the sleeve first.  When getting undressed, take your operative arm out last.  Loose fitting, button-down shirts are recommended.  Often in the first days after surgery you may be more comfortable keeping your operative arm under your shirt and not through the sleeve.  You may return to work/school in the next couple of days when you feel up to it.  Desk work and typing in the sling is fine.  We suggest you use the pain medication the first night prior to going to bed, in order to ease any pain when the anesthesia wears off. You should avoid taking pain medications on an empty stomach as it will make you nauseous.  You should wean off your narcotic medicines as soon as you are able.  Most patients will be off narcotics before their first postop appointment.   Do not drink alcoholic beverages or take illicit drugs when taking pain medications.  It is against the law to drive while taking narcotics.  In some states it is against the law to drive while your arm is in a sling.   Pain medication may make you constipated.  Below are a few solutions to try in this order: Decrease the amount of pain medication if you aren't having pain. Drink lots of decaffeinated fluids. Drink prune juice and/or eat dried prunes  If the first 3 don't work start with additional solutions Take Colace - an over-the-counter stool softener Take Senokot - an over-the-counter laxative Take Miralax - a stronger over-the-counter laxative  For more information including helpful videos and documents visit our website:   [email or web address]     Post Anesthesia Home Care Instructions  Activity: Get plenty of rest for the remainder of the day. A responsible individual must stay with you for 24 hours following the procedure.  For the next 24 hours, DO NOT: -Drive a car -Advertising copywriter -Drink alcoholic beverages -Take any medication unless instructed by your physician -Make  any legal decisions or sign important papers.  Meals: Start with liquid foods such as gelatin or soup. Progress to regular foods as tolerated. Avoid greasy, spicy, heavy foods. If nausea and/or vomiting occur, drink only clear liquids until the nausea and/or vomiting subsides. Call your physician if vomiting continues.  Special Instructions/Symptoms: Your throat may feel dry or sore from the anesthesia or the breathing tube placed in your throat during surgery. If this causes discomfort, gargle with warm salt water. The discomfort should disappear within 24 hours.  You may have Tylenol after 2pm if needed.  Regional Anesthesia Blocks  1. You may not be able to move or feel the blocked extremity after a regional anesthetic block. This may last may last from 3-48 hours after placement, but it will go away. The length of time depends on the medication injected and your individual response to the medication. As the nerves start to wake up, you may experience tingling as the movement and feeling returns  to your extremity. If the numbness and inability to move your extremity has not gone away after 48 hours, please call your surgeon.   2. The extremity that is blocked will need to be protected until the numbness is gone and the strength has returned. Because you cannot feel it, you will need to take extra care to avoid injury. Because it may be weak, you may have difficulty moving it or using it. You may not know what position it is in without looking at it while the block is in effect.  3. For blocks in the legs and feet, returning to weight bearing and walking needs to be done carefully. You will need to wait until the numbness is entirely gone and the strength has returned. You should be able to move your leg and foot normally before you try and bear weight or walk. You will need someone to be with you when you first try to ensure you do not fall and possibly risk injury.  4. Bruising and tenderness  at the needle site are common side effects and will resolve in a few days.  5. Persistent numbness or new problems with movement should be communicated to the surgeon or the [facility] [phone] [facility] [phone]).DonJoy Ultrasling IV/Ultrasling IIIER:  Please contact your surgeon if you have questions or concerns about your sling.          Information for Discharge Teaching: EXPAREL (bupivacaine liposome injectable suspension)   Pain relief is important to your recovery. The goal is to control your pain so you can move easier and return to your normal activities as soon as possible after your procedure. Your physician may use several types of medicines to manage pain, swelling, and more.  Your surgeon or anesthesiologist gave you EXPAREL(bupivacaine) to help control your pain after surgery.  EXPAREL is a local anesthetic designed to release slowly over an extended period of time to provide pain relief by numbing the tissue around the surgical site. EXPAREL is designed to release pain medication over time and can control pain for up to 72 hours. Depending on how you respond to EXPAREL, you may require less pain medication during your recovery. EXPAREL can help reduce or eliminate the need for opioids during the first few days after surgery when pain relief is needed the most. EXPAREL is not an opioid and is not addictive. It does not cause sleepiness or sedation.   Important! A teal colored band has been placed on your arm with the date, time and amount of EXPAREL you have received. Please leave this armband in place for the full 96 hours following administration, and then you may remove the band. If you return to the hospital for any reason within 96 hours following the administration of EXPAREL, the armband provides important information that your health care providers to know, and alerts them that you have received this anesthetic.    Possible  side effects of EXPAREL: Temporary loss of sensation or ability to move in the area where medication was injected. Nausea, vomiting, constipation Rarely, numbness and tingling in your mouth or lips, lightheadedness, or anxiety may occur. Call your doctor right away if you think you may be experiencing any of these sensations, or if you have other questions regarding possible side effects.  Follow all other discharge instructions given to you by your surgeon or nurse. Eat a healthy diet and drink plenty of water or other fluids.

## 2023-06-20 NOTE — Anesthesia Preprocedure Evaluation (Signed)
 Anesthesia Evaluation  Patient identified by MRN, date of birth, ID band Patient awake    Reviewed: Allergy & Precautions, NPO status , Patient's Chart, lab work & pertinent test results  History of Anesthesia Complications Negative for: history of anesthetic complications  Airway Mallampati: II  TM Distance: >3 FB Neck ROM: Full    Dental no notable dental hx. (+) Teeth Intact, Dental Advisory Given   Pulmonary neg pulmonary ROS   Pulmonary exam normal breath sounds clear to auscultation       Cardiovascular (-) hypertension(-) angina (-) Past MI Normal cardiovascular exam Rhythm:Regular Rate:Normal     Neuro/Psych negative neurological ROS  negative psych ROS   GI/Hepatic   Endo/Other    Renal/GU   negative genitourinary   Musculoskeletal  (+) Arthritis ,    Abdominal   Peds  Hematology   Anesthesia Other Findings   Reproductive/Obstetrics negative OB ROS                             Anesthesia Physical Anesthesia Plan  ASA: 2  Anesthesia Plan: General   Post-op Pain Management: Regional block*, Minimal or no pain anticipated and Ofirmev IV (intra-op)*   Induction: Intravenous  PONV Risk Score and Plan: Treatment may vary due to age or medical condition, Midazolam  and Ondansetron  Airway Management Planned: Oral ETT  Additional Equipment: None  Intra-op Plan:   Post-operative Plan: Extubation in OR  Informed Consent: I have reviewed the patients History and Physical, chart, labs and discussed the procedure including the risks, benefits and alternatives for the proposed anesthesia with the patient or authorized representative who has indicated his/her understanding and acceptance.     Dental advisory given  Plan Discussed with: CRNA and Surgeon  Anesthesia Plan Comments: (R ISB + GA)       Anesthesia Quick Evaluation

## 2023-06-21 ENCOUNTER — Ambulatory Visit (HOSPITAL_BASED_OUTPATIENT_CLINIC_OR_DEPARTMENT_OTHER)
Admission: RE | Admit: 2023-06-21 | Discharge: 2023-06-21 | Disposition: A | Discharge status: Home / Self Care | Attending: Orthopaedic Surgery | Admitting: Orthopaedic Surgery

## 2023-06-21 ENCOUNTER — Encounter (HOSPITAL_BASED_OUTPATIENT_CLINIC_OR_DEPARTMENT_OTHER)
Admission: RE | Disposition: A | Discharge status: Home / Self Care | Payer: Self-pay | Source: Home / Self Care | Attending: Orthopaedic Surgery

## 2023-06-21 ENCOUNTER — Encounter (HOSPITAL_BASED_OUTPATIENT_CLINIC_OR_DEPARTMENT_OTHER): Payer: Self-pay | Admitting: Orthopaedic Surgery

## 2023-06-21 ENCOUNTER — Ambulatory Visit (HOSPITAL_BASED_OUTPATIENT_CLINIC_OR_DEPARTMENT_OTHER): Payer: Self-pay | Admitting: Anesthesiology

## 2023-06-21 ENCOUNTER — Other Ambulatory Visit: Payer: Self-pay

## 2023-06-21 DIAGNOSIS — Z01818 Encounter for other preprocedural examination: Secondary | ICD-10-CM

## 2023-06-21 DIAGNOSIS — S46011A Strain of muscle(s) and tendon(s) of the rotator cuff of right shoulder, initial encounter: Secondary | ICD-10-CM | POA: Insufficient documentation

## 2023-06-21 DIAGNOSIS — X58XXXA Exposure to other specified factors, initial encounter: Secondary | ICD-10-CM | POA: Diagnosis not present

## 2023-06-21 DIAGNOSIS — M7541 Impingement syndrome of right shoulder: Secondary | ICD-10-CM | POA: Insufficient documentation

## 2023-06-21 DIAGNOSIS — S43431A Superior glenoid labrum lesion of right shoulder, initial encounter: Secondary | ICD-10-CM | POA: Insufficient documentation

## 2023-06-21 DIAGNOSIS — M24111 Other articular cartilage disorders, right shoulder: Secondary | ICD-10-CM | POA: Diagnosis not present

## 2023-06-21 DIAGNOSIS — M7521 Bicipital tendinitis, right shoulder: Secondary | ICD-10-CM | POA: Diagnosis not present

## 2023-06-21 DIAGNOSIS — M75101 Unspecified rotator cuff tear or rupture of right shoulder, not specified as traumatic: Secondary | ICD-10-CM | POA: Diagnosis not present

## 2023-06-21 DIAGNOSIS — G8918 Other acute postprocedural pain: Secondary | ICD-10-CM | POA: Diagnosis not present

## 2023-06-21 DIAGNOSIS — M1990 Unspecified osteoarthritis, unspecified site: Secondary | ICD-10-CM | POA: Insufficient documentation

## 2023-06-21 DIAGNOSIS — M75111 Incomplete rotator cuff tear or rupture of right shoulder, not specified as traumatic: Secondary | ICD-10-CM | POA: Insufficient documentation

## 2023-06-21 HISTORY — PX: SHOULDER ARTHROSCOPY WITH ROTATOR CUFF REPAIR: SHX5685

## 2023-06-21 SURGERY — ARTHROSCOPY, SHOULDER, WITH ROTATOR CUFF REPAIR
Anesthesia: General | Site: Shoulder | Laterality: Right

## 2023-06-21 MED ORDER — LACTATED RINGERS IV SOLN: INTRAVENOUS | Status: DC

## 2023-06-21 MED ORDER — ONDANSETRON HCL 4 MG/2ML IJ SOLN
INTRAMUSCULAR | Status: DC | PRN
Start: 2023-06-21 — End: 2023-06-21
  Administered 2023-06-21: 4 mg via INTRAVENOUS

## 2023-06-21 MED ORDER — ROCURONIUM BROMIDE 10 MG/ML (PF) SYRINGE
PREFILLED_SYRINGE | INTRAVENOUS | Status: DC | PRN
  Administered 2023-06-21: 60 mg via INTRAVENOUS

## 2023-06-21 MED ORDER — BUPIVACAINE HCL (PF) 0.5 % IJ SOLN
INTRAMUSCULAR | Status: DC | PRN
Start: 2023-06-21 — End: 2023-06-21
  Administered 2023-06-21: 15 mL via PERINEURAL

## 2023-06-21 MED ORDER — CELECOXIB 100 MG PO CAPS
100.0000 mg | ORAL_CAPSULE | Freq: Two times a day (BID) | ORAL | 0 refills | Status: AC
Start: 2023-06-21 — End: 2023-07-21

## 2023-06-21 MED ORDER — GABAPENTIN 300 MG PO CAPS
300.0000 mg | ORAL_CAPSULE | Freq: Once | ORAL | Status: AC
Start: 2023-06-21 — End: 2023-06-21
  Administered 2023-06-21: 300 mg via ORAL

## 2023-06-21 MED ORDER — DEXAMETHASONE SODIUM PHOSPHATE 10 MG/ML IJ SOLN
INTRAMUSCULAR | Status: DC | PRN
  Administered 2023-06-21: 10 mg via INTRAVENOUS

## 2023-06-21 MED ORDER — SUGAMMADEX SODIUM 200 MG/2ML IV SOLN
INTRAVENOUS | Status: DC | PRN
  Administered 2023-06-21: 200 mg via INTRAVENOUS

## 2023-06-21 MED ORDER — ACETAMINOPHEN 500 MG PO TABS
1000.0000 mg | ORAL_TABLET | Freq: Once | ORAL | Status: AC
Start: 2023-06-21 — End: 2023-06-21
  Administered 2023-06-21: 1000 mg via ORAL

## 2023-06-21 MED ORDER — PROPOFOL 10 MG/ML IV BOLUS
INTRAVENOUS | Status: AC
  Filled 2023-06-21: qty 20

## 2023-06-21 MED ORDER — GABAPENTIN 300 MG PO CAPS
ORAL_CAPSULE | ORAL | Status: AC
  Filled 2023-06-21: qty 1

## 2023-06-21 MED ORDER — ACETAMINOPHEN 10 MG/ML IV SOLN: 1000.0000 mg | Freq: Once | INTRAVENOUS | Status: DC | PRN

## 2023-06-21 MED ORDER — ONDANSETRON HCL 4 MG/2ML IJ SOLN: 4.0000 mg | Freq: Once | INTRAMUSCULAR | Status: DC | PRN

## 2023-06-21 MED ORDER — EPINEPHRINE PF 1 MG/ML IJ SOLN
INTRAMUSCULAR | Status: AC
  Filled 2023-06-21: qty 4

## 2023-06-21 MED ORDER — AMISULPRIDE (ANTIEMETIC) 5 MG/2ML IV SOLN: 10.0000 mg | Freq: Once | INTRAVENOUS | Status: DC | PRN

## 2023-06-21 MED ORDER — ACETAMINOPHEN 500 MG PO TABS: 1000.0000 mg | ORAL_TABLET | Freq: Three times a day (TID) | ORAL | 0 refills | Status: AC

## 2023-06-21 MED ORDER — FENTANYL CITRATE (PF) 100 MCG/2ML IJ SOLN
50.0000 ug | Freq: Once | INTRAMUSCULAR | Status: AC
  Administered 2023-06-21: 50 ug via INTRAVENOUS

## 2023-06-21 MED ORDER — MIDAZOLAM HCL 2 MG/2ML IJ SOLN
INTRAMUSCULAR | Status: AC
Start: 2023-06-21 — End: 2023-06-21
  Filled 2023-06-21: qty 2

## 2023-06-21 MED ORDER — FENTANYL CITRATE (PF) 100 MCG/2ML IJ SOLN
INTRAMUSCULAR | Status: AC
  Filled 2023-06-21: qty 2

## 2023-06-21 MED ORDER — ONDANSETRON HCL 4 MG PO TABS: 4.0000 mg | ORAL_TABLET | Freq: Three times a day (TID) | ORAL | 0 refills | Status: AC | PRN

## 2023-06-21 MED ORDER — SODIUM CHLORIDE 0.9 % IR SOLN
Status: DC | PRN
  Administered 2023-06-21: 6000 mL

## 2023-06-21 MED ORDER — OXYCODONE HCL 5 MG/5ML PO SOLN: 5.0000 mg | Freq: Once | ORAL | Status: DC | PRN

## 2023-06-21 MED ORDER — MIDAZOLAM HCL 2 MG/2ML IJ SOLN
2.0000 mg | Freq: Once | INTRAMUSCULAR | Status: AC
  Administered 2023-06-21: 2 mg via INTRAVENOUS

## 2023-06-21 MED ORDER — OXYCODONE HCL 5 MG PO TABS: 5.0000 mg | ORAL_TABLET | Freq: Once | ORAL | Status: DC | PRN

## 2023-06-21 MED ORDER — PROPOFOL 10 MG/ML IV BOLUS
INTRAVENOUS | Status: DC | PRN
  Administered 2023-06-21: 170 mg via INTRAVENOUS

## 2023-06-21 MED ORDER — EPHEDRINE SULFATE-NACL 50-0.9 MG/10ML-% IV SOSY
PREFILLED_SYRINGE | INTRAVENOUS | Status: DC | PRN
  Administered 2023-06-21: 10 mg via INTRAVENOUS
  Administered 2023-06-21 (×3): 5 mg via INTRAVENOUS

## 2023-06-21 MED ORDER — ACETAMINOPHEN 500 MG PO TABS
ORAL_TABLET | ORAL | Status: AC
  Filled 2023-06-21: qty 2

## 2023-06-21 MED ORDER — BUPIVACAINE LIPOSOME 1.3 % IJ SUSP
INTRAMUSCULAR | Status: DC | PRN
  Administered 2023-06-21: 10 mL via PERINEURAL

## 2023-06-21 MED ORDER — TRANEXAMIC ACID-NACL 1000-0.7 MG/100ML-% IV SOLN
1000.0000 mg | INTRAVENOUS | Status: AC
  Administered 2023-06-21: 1000 mg via INTRAVENOUS

## 2023-06-21 MED ORDER — OXYCODONE HCL 5 MG PO TABS: ORAL_TABLET | ORAL | 0 refills | Status: AC

## 2023-06-21 MED ORDER — LIDOCAINE 2% (20 MG/ML) 5 ML SYRINGE
INTRAMUSCULAR | Status: DC | PRN
  Administered 2023-06-21: 100 mg via INTRAVENOUS

## 2023-06-21 MED ORDER — FENTANYL CITRATE (PF) 100 MCG/2ML IJ SOLN
INTRAMUSCULAR | Status: DC | PRN
  Administered 2023-06-21: 100 ug via INTRAVENOUS

## 2023-06-21 MED ORDER — TRANEXAMIC ACID-NACL 1000-0.7 MG/100ML-% IV SOLN
INTRAVENOUS | Status: AC
  Filled 2023-06-21: qty 100

## 2023-06-21 MED ORDER — HYDROMORPHONE HCL 1 MG/ML IJ SOLN: 0.2500 mg | INTRAMUSCULAR | Status: DC | PRN

## 2023-06-21 MED ORDER — CEFAZOLIN SODIUM-DEXTROSE 2-4 GM/100ML-% IV SOLN
2.0000 g | INTRAVENOUS | Status: AC
  Administered 2023-06-21: 2 g via INTRAVENOUS

## 2023-06-21 MED ORDER — CEFAZOLIN SODIUM-DEXTROSE 2-4 GM/100ML-% IV SOLN
INTRAVENOUS | Status: AC
  Filled 2023-06-21: qty 100

## 2023-06-21 SURGICAL SUPPLY — 51 items
ANCHOR FIBERTAK RC 2.6 (BLUE) (Anchor) IMPLANT
ANCHOR SUT 1.8 FIBERTAK SB KL (Anchor) IMPLANT
ANCHOR SUT BIO SW 4.75X19.1 (Anchor) IMPLANT
ANCHOR SUT FBRTK 2.6X1.7X2 (Anchor) IMPLANT
BLADE EXCALIBUR 4.0X13 (MISCELLANEOUS) ×1 IMPLANT
BURR OVAL 8 FLU 4.0X13 (MISCELLANEOUS) ×1 IMPLANT
CANNULA 5.75X71 LONG (CANNULA) IMPLANT
CANNULA PASSPORT 5 (CANNULA) IMPLANT
CANNULA PASSPORT BUTTON 10-40 (CANNULA) IMPLANT
CANNULA TWIST IN 8.25X7CM (CANNULA) IMPLANT
CHLORAPREP W/TINT 26 (MISCELLANEOUS) ×1 IMPLANT
CLSR STERI-STRIP ANTIMIC 1/2X4 (GAUZE/BANDAGES/DRESSINGS) ×1 IMPLANT
COOLER ICEMAN CLASSIC (MISCELLANEOUS) ×1 IMPLANT
DRAPE IMP U-DRAPE 54X76 (DRAPES) ×1 IMPLANT
DRAPE INCISE IOBAN 66X45 STRL (DRAPES) IMPLANT
DRAPE SHOULDER BEACH CHAIR (DRAPES) ×1 IMPLANT
DW OUTFLOW CASSETTE/TUBE SET (MISCELLANEOUS) ×1 IMPLANT
GAUZE PAD ABD 8X10 STRL (GAUZE/BANDAGES/DRESSINGS) ×1 IMPLANT
GAUZE SPONGE 4X4 12PLY STRL (GAUZE/BANDAGES/DRESSINGS) ×1 IMPLANT
GLOVE BIO SURGEON STRL SZ 6.5 (GLOVE) ×1 IMPLANT
GLOVE BIOGEL PI IND STRL 6.5 (GLOVE) ×1 IMPLANT
GLOVE BIOGEL PI IND STRL 8 (GLOVE) ×1 IMPLANT
GLOVE ECLIPSE 8.0 STRL XLNG CF (GLOVE) ×1 IMPLANT
GOWN STRL REUS W/ TWL LRG LVL3 (GOWN DISPOSABLE) ×2 IMPLANT
GOWN STRL REUS W/TWL XL LVL3 (GOWN DISPOSABLE) ×1 IMPLANT
KIT STABILIZATION SHOULDER (MISCELLANEOUS) ×1 IMPLANT
KIT STR SPEAR 1.8 FBRTK DISP (KITS) IMPLANT
LASSO 90 CVE QUICKPAS (DISPOSABLE) IMPLANT
LASSO CRESCENT QUICKPASS (SUTURE) IMPLANT
MANIFOLD NEPTUNE II (INSTRUMENTS) ×1 IMPLANT
NDL HD SCORPION MEGA LOADER (NEEDLE) IMPLANT
NDL SAFETY ECLIPSE 18X1.5 (NEEDLE) ×1 IMPLANT
PACK ARTHROSCOPY DSU (CUSTOM PROCEDURE TRAY) ×1 IMPLANT
PACK BASIN DAY SURGERY FS (CUSTOM PROCEDURE TRAY) ×1 IMPLANT
PAD COLD SHLDR WRAP-ON (PAD) ×1 IMPLANT
PAD ORTHO SHOULDER 7X19 LRG (SOFTGOODS) ×1 IMPLANT
RESTRAINT HEAD UNIVERSAL NS (MISCELLANEOUS) ×1 IMPLANT
SHEET MEDIUM DRAPE 40X70 STRL (DRAPES) ×1 IMPLANT
SLEEVE SCD COMPRESS KNEE MED (STOCKING) ×1 IMPLANT
SUT MNCRL AB 4-0 PS2 18 (SUTURE) ×1 IMPLANT
SUT PDS AB 0 CT 36 (SUTURE) IMPLANT
SUT TIGER TAPE 7 IN WHITE (SUTURE) IMPLANT
SUTURE 0 FIBRLNK 1.5 FWIRE CLS (SUTURE) IMPLANT
SUTURE FIBERWR #2 38 T-5 BLUE (SUTURE) IMPLANT
SUTURE TAPE TIGERLINK 1.3MM BL (SUTURE) IMPLANT
SYR 5ML LL (SYRINGE) ×1 IMPLANT
TAPE FIBER 2MM 7IN #2 BLUE (SUTURE) IMPLANT
TOWEL GREEN STERILE FF (TOWEL DISPOSABLE) ×2 IMPLANT
TUBE CONNECTING 20X1/4 (TUBING) ×1 IMPLANT
TUBING ARTHROSCOPY IRRIG 16FT (MISCELLANEOUS) ×1 IMPLANT
WAND ABLATOR APOLLO I90 (BUR) ×1 IMPLANT

## 2023-06-21 NOTE — Anesthesia Procedure Notes (Signed)
 Procedure Name: Intubation Date/Time: 06/21/2023 9:58 AM  Performed by: Steffani Edman, CRNAPre-anesthesia Checklist: Patient identified, Emergency Drugs available, Suction available and Patient being monitored Patient Re-evaluated:Patient Re-evaluated prior to induction Oxygen Delivery Method: Circle System Utilized Preoxygenation: Pre-oxygenation with 100% oxygen Induction Type: IV induction Ventilation: Mask ventilation without difficulty Laryngoscope Size: 4 and Mac Grade View: Grade I Tube type: Oral Tube size: 7.0 mm Number of attempts: 1 Airway Equipment and Method: Stylet and Oral airway Placement Confirmation: ETT inserted through vocal cords under direct vision, positive ETCO2 and breath sounds checked- equal and bilateral Secured at: 22 cm Tube secured with: Tape Dental Injury: Teeth and Oropharynx as per pre-operative assessment

## 2023-06-21 NOTE — Interval H&P Note (Signed)
 All questions answered, patient wants to proceed with procedure. ? ?

## 2023-06-21 NOTE — Progress Notes (Signed)
Assisted Dr. Valma Cava with right, interscalene , ultrasound guided block. Side rails up, monitors on throughout procedure. See vital signs in flow sheet. Tolerated Procedure well.

## 2023-06-21 NOTE — Transfer of Care (Signed)
 Immediate Anesthesia Transfer of Care Note  Patient: Lonnie Castro  Procedure(s) Performed: ARTHROSCOPY, SHOULDER, WITH ROTATOR CUFF REPAIR (Right: Shoulder)  Patient Location: PACU  Anesthesia Type:General  Level of Consciousness: drowsy and patient cooperative  Airway & Oxygen Therapy: Patient Spontanous Breathing and Patient connected to face mask oxygen  Post-op Assessment: Report given to RN and Post -op Vital signs reviewed and stable  Post vital signs: Reviewed and stable  Last Vitals:  Vitals Value Taken Time  BP 113/73   Temp    Pulse 66 06/21/23 11:09  Resp 11 06/21/23 11:09  SpO2 97 % 06/21/23 11:09  Vitals shown include unfiled device data.  Last Pain:  Vitals:   06/21/23 0755  TempSrc: Temporal  PainSc: 1       Patients Stated Pain Goal: 3 (06/21/23 0755)  Complications: No notable events documented.

## 2023-06-21 NOTE — Anesthesia Procedure Notes (Signed)
 Anesthesia Regional Block: Interscalene brachial plexus block   Pre-Anesthetic Checklist: , timeout performed,  Correct Patient, Correct Site, Correct Laterality,  Correct Procedure, Correct Position, site marked,  Risks and benefits discussed,  Surgical consent,  Pre-op evaluation,  At surgeon's request and post-op pain management  Laterality: Upper and Right  Prep: Maximum Sterile Barrier Precautions used, chloraprep       Needles:  Injection technique: Single-shot  Needle Type: Echogenic Needle     Needle Length: 5cm  Needle Gauge: 21     Additional Needles:   Procedures:,,,, ultrasound used (permanent image in chart),,    Narrative:  Start time: 06/21/2023 8:19 AM End time: 06/21/2023 8:25 AM Injection made incrementally with aspirations every 5 mL.  Performed by: Personally  Anesthesiologist: Rosalita Combe, MD  Additional Notes: Block assessed prior to procedure. Patient tolerated procedure well.

## 2023-06-21 NOTE — Anesthesia Postprocedure Evaluation (Signed)
 Anesthesia Post Note  Patient: Lonnie Castro  Procedure(s) Performed: ARTHROSCOPY, SHOULDER, WITH ROTATOR CUFF REPAIR (Right: Shoulder)     Patient location during evaluation: PACU Anesthesia Type: General and Regional Level of consciousness: awake and alert Pain management: pain level controlled Vital Signs Assessment: post-procedure vital signs reviewed and stable Respiratory status: spontaneous breathing, nonlabored ventilation, respiratory function stable and patient connected to nasal cannula oxygen Cardiovascular status: blood pressure returned to baseline and stable Postop Assessment: no apparent nausea or vomiting Anesthetic complications: no  No notable events documented.  Last Vitals:  Vitals:   06/21/23 1115 06/21/23 1132  BP: 108/69 118/74  Pulse: 78   Resp: 20 14  Temp:  (!) 36.3 C  SpO2: 96% 96%    Last Pain:  Vitals:   06/21/23 1132  TempSrc:   PainSc: 0-No pain                 Rosalita Combe

## 2023-06-21 NOTE — Op Note (Signed)
 Orthopaedic Surgery Operative Note (CSN: [id number])  Tanuj Mullens  [birth year] Date of Surgery: 06/21/2023   DIAGNOSES: Right shoulder, acute on chronic rotator cuff tear, SLAP tear, biceps tendinitis, and subacromial impingement.  POST-OPERATIVE DIAGNOSIS: same  PROCEDURE: Arthroscopic extensive debridement - 29823 Subdeltoid Bursa, Supraspinatus Tendon, Anterior Labrum, Superior Labrum, Posterior Labrum, and glenoid bone, glenoid cartilage, humeral bone and humeral cartilage Arthroscopic subacromial decompression - 25366 Arthroscopic rotator cuff repair - 44034 Arthroscopic biceps tenodesis - 74259   OPERATIVE FINDING: Exam under anesthesia: Normal Articular space: Anterior posterior labral tearing Chondral surfaces: Normal Biceps: Type II SLAP tear Subscapularis: Intact  Supraspinatus: Incomplete tear 90% articular sided tear Infraspinatus: Intact    Tissue quality was reasonable, bone quality was reasonable.  2 x 2 repair performed.  Early therapy indicated.  Post-operative plan: The patient will be non-weightbearing in a sling .  The patient will be discharged home.  DVT prophylaxis not indicated in ambulatory upper extremity patient without known risk factors.   Pain control with PRN pain medication preferring oral medicines.  Follow up plan will be scheduled in approximately 7 days for incision check and XR.  Surgeons:Primary: Micheline Ahr, MD Assistants:Caroline McBane, PA-C and Rozanna Corner, RNFA Location: [facility] OR ROOM 6 Anesthesia: General with Exparel interscalene block Antibiotics: Ancef 2 g Tourniquet time: None Estimated Blood Loss: Minimal Complications: None Specimens: None Implants: Implant Name Type Inv. Item Serial No. Manufacturer Lot No. LRB No. Used Action  ANCHOR SUT BIO SW 4.75X19.1 - [id number] Anchor ANCHOR SUT BIO SW 4.75X19.1  [employer] [id number] Right 1 Implanted  ANCHOR FIBERTAK RC 2.6 [facility]) - [id number] Anchor ANCHOR FIBERTAK RC 2.6 (BLUE)   [employer] [id number] Right 1 Implanted  ANCHOR SUT FBRTK 2.6X1.7X2 - [id number] Anchor ANCHOR SUT FBRTK 2.6X1.7X2  [employer] [id number] Right 1 Implanted  ANCHOR SUT 1.8 FIBERTAK SB KL - [id number] Anchor ANCHOR SUT 1.8 FIBERTAK SB KL  [employer] [id number] Right 1 Implanted  ANCHOR SUT 1.8 FIBERTAK SB KL - [id number] Anchor ANCHOR SUT 1.8 FIBERTAK SB KL  [employer] [id number] Right 1 Implanted  ANCHOR SUT BIO SW 4.75X19.1 - [id number] Anchor ANCHOR SUT BIO SW 4.75X19.1  Anthonette Kinsman [id number] Right 1 Implanted    Indications for Surgery:   Lonnie Castro is a 58 y.o. male with continued shoulder pain refractory to nonoperative measures for extended period of time.    The risks and benefits were explained at length including but not limited to continued pain, cuff failure, biceps tenodesis failure, stiffness, need for further surgery and infection.   Procedure:   Patient was correctly identified in the preoperative holding area and operative site marked.  Patient brought to OR and positioned beachchair on an [place] table ensuring that all bony prominences were padded and the head was in an appropriate location.  Anesthesia was induced and the operative shoulder was prepped and draped in the usual sterile fashion.  Timeout was called preincision.  A standard posterior viewing portal was made after localizing the portal with a spinal needle.  An anterior accessory portal was also made.  After clearing the articular space the camera was positioned in the subacromial space.  Findings above.    Extensive debridement was performed of the anterior interval tissue, labral fraying and the bursa.  Glenoid bone, glenoid cartilage, humeral bone were all debrided.  Subacromial decompression: We made a lateral portal with spinal needle guidance. We then proceeded to debride bursal tissue extensively with a shaver and arthrocare device. At that point  we continued to identify the borders of the acromion and identify the  spur. We then carefully preserved the deltoid fascia and used a burr to convert the acromion to a Type 1 flat acromion without issue.  Arthroscopic rotator cuff repair: Once the above is complete we used a shaver as well as a bur to prepare the tuberosity and clear soft tissue so that there was bony bleeding and appropriate bloody flecks for healing.  We then placed 2 Medial Row 2.6 mm fiber tack anchors with knotless mechanisms and tape.  We used a scorpion as well as a link suture to pass all of the sutures from each anchor through the cuff.  We then were able to use the knotless mechanism sutures to perform a double medial row tiedown compressing the medial row without overtensioning. Once this was complete we cut the switch sutures and performed a speed bridge type repair pulling the tapes over to 2 lateral row 4.75 mm bio composite swivel locks.  This provided compression of the cuff to the tuberosity.  Biceps tenodesis: We marked the tendon and then performed a tenotomy and debridement of the stump in the articular space. We then identified the biceps tendon in its groove suprapec with the arthroscope in the lateral portal taking care to move from lateral to medial to avoid injury to the subscapularis. At that point we unroofed the tendon itself and mobilized it. An accessory anterior portal was made in line with the tendon and we grasped it from the anterior superior portal and worked from the accessory anterior portal. Two Fibertak 1.20mm knotless anchors were placed in the groove and the tendon was secured in a luggage loop style fashion with a pass of the limb of suture through the tendon using a scorpion device to avoid pull-through.  Repair was completed with good tension on the tendon.  Residual stump of the tendon was removed after being resected with a RF ablator.   The incisions were closed with absorbable monocryl and steri strips.  A sterile dressing was placed along with a sling. The patient  was awoken from general anesthesia and taken to the PACU in stable condition without complication.   Nicholas Bari, PA-C, present and scrubbed throughout the case, critical for completion in a timely fashion, and for retraction, instrumentation, closure.

## 2023-06-22 ENCOUNTER — Encounter (HOSPITAL_BASED_OUTPATIENT_CLINIC_OR_DEPARTMENT_OTHER): Payer: Self-pay | Admitting: Orthopaedic Surgery

## 2023-06-27 ENCOUNTER — Ambulatory Visit: Attending: Orthopaedic Surgery | Admitting: Physical Therapy

## 2023-06-27 DIAGNOSIS — Z9889 Other specified postprocedural states: Secondary | ICD-10-CM | POA: Diagnosis not present

## 2023-06-27 DIAGNOSIS — M25511 Pain in right shoulder: Secondary | ICD-10-CM | POA: Diagnosis not present

## 2023-06-27 NOTE — Therapy (Addendum)
 OUTPATIENT PHYSICAL THERAPY SHOULDER EVALUATION   Patient Name: Lonnie Castro MRN: [id number] DOB:1965-10-08, 58 y.o., male Today's Date: 06/27/2023  END OF SESSION:  PT End of Session - 06/27/23 1106     Visit Number 1    Number of Visits 25    Date for PT Re-Evaluation 09/19/23    Authorization Type [id number]    Authorization - Visit Number 1    Authorization - Number of Visits 25    Progress Note Due on Visit 10    PT Start Time 0955    PT Stop Time 1030    PT Time Calculation (min) 35 min    Activity Tolerance Patient tolerated treatment well    Behavior During Therapy [facility] for tasks assessed/performed          Past Medical History:  Diagnosis Date   Allergic rhinitis    Arthritis    Bronchitis 04/04/2021   Gout    Orchitis    Other fatigue 04/04/2021   Past Surgical History:  Procedure Laterality Date   COLONOSCOPY WITH PROPOFOL  N/A 11/03/2014   Procedure: COLONOSCOPY WITH PROPOFOL ;  Surgeon: Gladis RAYMOND Mariner, MD;  Location: [facility] ENDOSCOPY;  Service: Endoscopy;  Laterality: N/A;   COLONOSCOPY WITH PROPOFOL  N/A 05/04/2022   Procedure: COLONOSCOPY WITH PROPOFOL ;  Surgeon: Unk Corinn Skiff, MD;  Location: [facility] ENDOSCOPY;  Service: Gastroenterology;  Laterality: N/A;   FRACTURE SURGERY     HERNIA REPAIR  01/02/1982   NASAL SEPTUM SURGERY  01/02/1985   SHOULDER ARTHROSCOPY WITH ROTATOR CUFF REPAIR Right 06/21/2023   Procedure: ARTHROSCOPY, SHOULDER, WITH ROTATOR CUFF REPAIR;  Surgeon: Cristy Bonner DASEN, MD;  Location: [facility];  Service: Orthopedics;  Laterality: Right;   Patient Active Problem List   Diagnosis Date Noted   Screen for colon cancer 05/04/2022   Tinnitus 06/18/2015   Preventative health care 08/28/2014   Hyperlipidemia 08/28/2014   Eczema of external ear 08/28/2014   Chronic gout 05/25/2014   Rhinitis, allergic 05/25/2014    PCP: Comer Gaskins NP   REFERRING PROVIDER: Dr. Bonner Cristy   REFERRING DIAG: [phone] (ICD-10-CM) - S/P  arthroscopy of right shoulder  THERAPY DIAG:  Acute pain of right shoulder  S/P arthroscopy of right shoulder  Rationale for Evaluation and Treatment: Rehabilitation  ONSET DATE: 06/21/23  SUBJECTIVE:                                                                                                                                                                                      SUBJECTIVE STATEMENT: See pertinent   Hand dominance: Right  PERTINENT HISTORY: Pt referred to therapy following right shoulder arthroscopy with a/c  joint decompression, RTC debridement, biceps tenodesis and SLAP repair on 06/21/23. His pain is well controlled and he presents with right arm in sling. He wants to return to lifting at gym and using his RUE for job related tasks like typing and moving mouse around.   PAIN:  Are you having pain? Yes: NPRS scale: 4-5/10 Pain location: Right anterior shoulder  Pain description: Achy  Aggravating factors: Pain comes and goes   Relieving factors: Keeping arm still and taking pain medications    PRECAUTIONS: Shoulder -No AAROM or AROM of right shoulder until week 5 and/or cleared by surgeon   RED FLAGS: None   WEIGHT BEARING RESTRICTIONS:  No weight bearing through RUE    FALLS:  Has patient fallen in last 6 months? No  LIVING ENVIRONMENT: Lives with: lives with their spouse Lives in: House/apartment Stairs: Did not ask  Has following equipment at home: Did not ask   OCCUPATION: Psychologist, sport and exercise    PLOF: Independent  PATIENT GOALS: Wants to   NEXT MD VISIT:   OBJECTIVE:  Note: Objective measures were completed at Evaluation unless otherwise noted.  VITALS: BP 129/75 HR 77 Sp02 97%  DIAGNOSTIC FINDINGS:  No imaging listed in chart for right shoulder  PATIENT SURVEYS:  Quick Dash:  QUICK DASH  Please rate your ability do the following activities in the last week by selecting the number below the appropriate response.   Activities Rating   Open a tight or new jar.  5 = Unable  Do heavy household chores (e.g., wash walls, floors). 5 = Unable  Carry a shopping bag or briefcase 5 = Unable  Wash your back. 5 = Unable  Use a knife to cut food. 5 = Unable  Recreational activities in which you take some force or impact through your arm, shoulder or hand (e.g., golf, hammering, tennis, etc.). 5 = Unable  During the past week, to what extent has your arm, shoulder or hand problem interfered with your normal social activities with family, friends, neighbors or groups?  5 = Extremely  During the past week, were you limited in your work or other regular daily activities as a result of your arm, shoulder or hand problem? 5 = Unable  During the past week, were you limited in your work or other regular daily activities as a result of your arm, shoulder or hand problem? 4 = Severe  Tingling (pins and needles) in your arm, shoulder or hand. 2 = Mild  During the past week, how much difficulty have you had sleeping because of the pain in your arm, shoulder or hand?  2 = Mild difficulty   (A QuickDASH score may not be calculated if there is greater than 1 missing item.)  Quick Dash Disability/Symptom Score: [(sum of  (48) responses/11 (n)] -1] x 25 = 84%  Minimally Clinically Important Difference (MCID): 15-20 points  (Franchignoni, F. et al. (2013). Minimally clinically important difference of the disabilities of the arm, shoulder, and hand outcome measures (DASH) and its shortened version (Quick DASH). Journal of Orthopaedic & Sports Physical Therapy, 44(1), 30-39)   COGNITION: Overall cognitive status: Within functional limits for tasks assessed     SENSATION: Not tested  POSTURE: No postural deficits noted   UPPER EXTREMITY ROM:   Active ROM Right eval Left eval  Shoulder flexion NT/45 deg 180  Shoulder extension    Shoulder abduction NT/45 deg   Shoulder adduction    Shoulder internal rotation    Shoulder external rotation  Elbow flexion NT/90   Elbow extension NT/0   Wrist flexion    Wrist extension    Wrist ulnar deviation    Wrist radial deviation    Wrist pronation    Wrist supination    (Blank rows = not tested)  UPPER EXTREMITY MMT:  MMT Right eval Left eval  Shoulder flexion    Shoulder extension    Shoulder abduction    Shoulder adduction    Shoulder internal rotation    Shoulder external rotation    Middle trapezius    Lower trapezius    Elbow flexion    Elbow extension    Wrist flexion    Wrist extension    Wrist ulnar deviation    Wrist radial deviation    Wrist pronation    Wrist supination    Grip strength (lbs)                                                                                                    TREATMENT DATE: All exercises performed on RUE only  06/27/23: THEREX   Shoulder Flexion and Extension Pendulums in bent over position x 30  Shoulder Abduction and Adduction Pendulums in bent over position x 30  Shoulder Cradles Flexion and Extension in bent over position x 30  Shoulder Cradles Abduction and Adduction in bent over position x 30  Scapular Squeezes 1 x 10     PATIENT EDUCATION: Education details:  Form and technique for correct performance of exercise and explanation about post op protocol  Person educated: Patient Education method: Explanation, Demonstration, and Verbal cues Education comprehension: verbalized understanding, returned demonstration, and verbal cues required  HOME EXERCISE PROGRAM: Access Code: [email or web address] URL: [email or web address] Date: 06/27/2023 Prepared by: Toribio Servant  Exercises - Supported Elbow Flexion Extension PROM  - 1 x daily - 7 x weekly - 3 sets - 10 reps - Seated Scapular Retraction  - 1 x daily - 7 x weekly - 2 sets - 10 reps - 3 sec hold - Seated Wrist Supination PROM  - 1 x daily - 7 x weekly - 3 sets - 10 reps - Horizontal Shoulder Pendulum with Table Support  - 1 x daily - 7 x weekly - 3 sets  - 10 reps - Gentle Vertical Shoulder Pendulum  - 1 x daily - 7 x weekly - 3 sets - 10 reps - Standing Shoulder Cradles- Horizontal Abduction/Adduction  - 1 x daily - 7 x weekly - 3 sets - 10 reps - Standing Shoulder Cradles- Flexion/Extension (Hinged at hips)  - 1 x daily - 7 x weekly - 3 sets - 10 reps  ASSESSMENT:  CLINICAL IMPRESSION: Patient is a 58 y.o. white male who was seen today for physical therapy evaluation and treatment for POD #6 for right shoulder arthroscopy with subacromial decompression, rotator cuff debridement, biceps tenodesis, and right SLAP repair. His pain is well controlled and PT educated him on post op protocol for SLAP repair, biceps tenodesis and RTC repair such as no AROM or AAROM and to continue to wear sling for the next couple  of weeks with exception of showering and performing exercises. His impairments include decreased right shoulder ROM and strength relative to left shoulder and increased right shoulder pain that is limiting his overall right shoulder function and preventing him from lifting weights and using RUE to type for his job. He will benefit from skilled PT to address these aforementioned deficits to regain his right shoulder's prior level of function to return to lifting weights with right shoulder and to typing for his job.  OBJECTIVE IMPAIRMENTS: decreased knowledge of condition, decreased ROM, decreased strength, increased muscle spasms, impaired UE functional use, and pain.   ACTIVITY LIMITATIONS: carrying, lifting, bathing, toileting, dressing, reach over head, and hygiene/grooming  PARTICIPATION LIMITATIONS: meal prep, cleaning, laundry, driving, shopping, community activity, occupation, and yard work  PERSONAL FACTORS: Age, Education, Fitness, and 1 comorbidity: Gout are also affecting patient's functional outcome.   REHAB POTENTIAL: Excellent  CLINICAL DECISION MAKING: Stable/uncomplicated  EVALUATION COMPLEXITY: Low   GOALS: Goals  reviewed with patient? No  SHORT TERM GOALS: Target date: 07/11/2023  Patient will demonstrate undestanding of home exercise plan by performing exercises correctly with evidence of good carry over with min to no verbal or tactile cues .   Baseline: NT  Goal status: INITIAL  LONG TERM GOALS: Target date: 09/19/2023  Patient will demonstrate a decrease of >=15 pts on QuickDash for right shoulder as evidence of improved right shoulder function.   Baseline: 84% or 48 pts total  Goal status: INITIAL  2.  Patient will demonstrate right shoulder AROM that is nearly symmetrical (within 10% of L shoulder ROM) to left shoulder for improved right shoulder function to perform reaching and mouse manipulation tasks for his job.  Baseline: Shoulder Flex PROM R 45, Shoulder Abd R 45, AROM not performed at this point due to protocol Goal status: INITIAL  3.  Patient will demonstrate right shoulder MMT that is symmetrical to left shoulder for improved right shoulder function to perform reaching and mouse manipulation tasks for his job.  Baseline: MMT not performed yet   Goal status: INITIAL  4.  Patient will be able to return to performing upper extremity weight lifting routine with RUE as evidence of improved right shoulder function to maintain physical health.  Baseline: Unable to perform   Goal status: INITIAL  5.  Patient will be able to return to using right shoulder for reaching, typing and manipulating mouse for work related tasks as evidence of improved right shoulder function. Baseline: Can perform some typing but not manipulation of mouse at moment  Goal status: INITIAL   PLAN:  PT FREQUENCY: 1-2x/week  PT DURATION: 12 weeks  PLANNED INTERVENTIONS: 97164- PT Re-evaluation, 97750- Physical Performance Testing, 97110-Therapeutic exercises, 97530- Therapeutic activity, 97112- Neuromuscular re-education, 97535- Self Care, 02859- Manual therapy, [id number]- Aquatic Therapy, H9716- Electrical  stimulation (unattended), [phone]- Electrical stimulation (manual), [id number]- Traction (mechanical), 20560 (1-2 muscles), 20561 (3+ muscles)- Dry Needling, Patient/Family education, Balance training, Taping, Joint mobilization, Joint manipulation, Spinal manipulation, Spinal mobilization, Cryotherapy, and Moist heat  PLAN FOR NEXT SESSION: Left shoulder MMT and periscapular MMT. Upper Trap Stretch. Review PROM restrictions from Bicep's Tenodesis protocol no shoulder ER PROM beyond 40 deg   Toribio Servant PT, DPT  [facility] [address] [place], [address] Phone: [phone]   Fax:  [phone]

## 2023-06-27 NOTE — Addendum Note (Signed)
 Addended by: THEOTIS TORIBIO PARAS on: 06/27/2023 11:10 AM   Modules accepted: Orders

## 2023-06-29 DIAGNOSIS — M24111 Other articular cartilage disorders, right shoulder: Secondary | ICD-10-CM | POA: Diagnosis not present

## 2023-07-02 ENCOUNTER — Ambulatory Visit: Admitting: Physical Therapy

## 2023-07-02 DIAGNOSIS — Z9889 Other specified postprocedural states: Secondary | ICD-10-CM

## 2023-07-02 DIAGNOSIS — M25511 Pain in right shoulder: Secondary | ICD-10-CM

## 2023-07-02 NOTE — Therapy (Addendum)
 OUTPATIENT PHYSICAL THERAPY SHOULDER TREATMENT   Patient Name: Lonnie Castro MRN: [id number] DOB:10/30/1965, 58 y.o., male Today's Date: 07/02/2023  END OF SESSION:  PT End of Session - 07/02/23 1038     Visit Number 2    Number of Visits 25    Date for PT Re-Evaluation 09/19/23    Authorization Type [id number]    Authorization - Visit Number 2    Authorization - Number of Visits 25    Progress Note Due on Visit 10    PT Start Time 1035    PT Stop Time 1115    PT Time Calculation (min) 40 min    Activity Tolerance Patient tolerated treatment well    Behavior During Therapy [facility] for tasks assessed/performed           Past Medical History:  Diagnosis Date   Allergic rhinitis    Arthritis    Bronchitis 04/04/2021   Gout    Orchitis    Other fatigue 04/04/2021   Past Surgical History:  Procedure Laterality Date   COLONOSCOPY WITH PROPOFOL  N/A 11/03/2014   Procedure: COLONOSCOPY WITH PROPOFOL ;  Surgeon: Gladis RAYMOND Mariner, MD;  Location: [facility] ENDOSCOPY;  Service: Endoscopy;  Laterality: N/A;   COLONOSCOPY WITH PROPOFOL  N/A 05/04/2022   Procedure: COLONOSCOPY WITH PROPOFOL ;  Surgeon: Unk Corinn Skiff, MD;  Location: [facility] ENDOSCOPY;  Service: Gastroenterology;  Laterality: N/A;   FRACTURE SURGERY     HERNIA REPAIR  01/02/1982   NASAL SEPTUM SURGERY  01/02/1985   SHOULDER ARTHROSCOPY WITH ROTATOR CUFF REPAIR Right 06/21/2023   Procedure: ARTHROSCOPY, SHOULDER, WITH ROTATOR CUFF REPAIR;  Surgeon: Cristy Bonner DASEN, MD;  Location: [facility];  Service: Orthopedics;  Laterality: Right;   Patient Active Problem List   Diagnosis Date Noted   Screen for colon cancer 05/04/2022   Tinnitus 06/18/2015   Preventative health care 08/28/2014   Hyperlipidemia 08/28/2014   Eczema of external ear 08/28/2014   Chronic gout 05/25/2014   Rhinitis, allergic 05/25/2014    PCP: Comer Gaskins NP   REFERRING PROVIDER: Dr. Bonner Cristy   REFERRING DIAG: [phone] (ICD-10-CM) -  S/P arthroscopy of right shoulder  THERAPY DIAG:  Acute pain of right shoulder  S/P arthroscopy of right shoulder  Rationale for Evaluation and Treatment: Rehabilitation  ONSET DATE: 06/21/23  SUBJECTIVE:                                                                                                                                                                                      SUBJECTIVE STATEMENT: See pertinent   Hand dominance: Right  PERTINENT HISTORY: Pt referred to therapy following right shoulder arthroscopy with  a/c joint decompression, RTC debridement, biceps tenodesis and SLAP repair. His pain is well controlled and he presents with right arm in sling. He wants to return to lifting at gym and using his RUE for job related tasks like typing and moving mouse around.   PAIN:  Are you having pain? Yes: NPRS scale: 4-5/10 Pain location: Right anterior shoulder  Pain description: Achy  Aggravating factors: Pain comes and goes   Relieving factors: Keeping arm still and taking pain medications    PRECAUTIONS: Shoulder -No AAROM or AROM of right shoulder until week 5 and/or cleared by surgeon   RED FLAGS: None   WEIGHT BEARING RESTRICTIONS:  No weight bearing through RUE    FALLS:  Has patient fallen in last 6 months? No  LIVING ENVIRONMENT: Lives with: lives with their spouse Lives in: House/apartment Stairs: Did not ask  Has following equipment at home: Did not ask   OCCUPATION: Psychologist, sport and exercise    PLOF: Independent  PATIENT GOALS: Wants to   NEXT MD VISIT:   OBJECTIVE:  Note: Objective measures were completed at Evaluation unless otherwise noted.  VITALS: BP 129/75 HR 77 Sp02 97%  DIAGNOSTIC FINDINGS:  No imaging listed in chart for right shoulder  PATIENT SURVEYS:  Quick Dash:  QUICK DASH  Please rate your ability do the following activities in the last week by selecting the number below the appropriate response.   Activities Rating  Open a  tight or new jar.  5 = Unable  Do heavy household chores (e.g., wash walls, floors). 5 = Unable  Carry a shopping bag or briefcase 5 = Unable  Wash your back. 5 = Unable  Use a knife to cut food. 5 = Unable  Recreational activities in which you take some force or impact through your arm, shoulder or hand (e.g., golf, hammering, tennis, etc.). 5 = Unable  During the past week, to what extent has your arm, shoulder or hand problem interfered with your normal social activities with family, friends, neighbors or groups?  5 = Extremely  During the past week, were you limited in your work or other regular daily activities as a result of your arm, shoulder or hand problem? 5 = Unable  During the past week, were you limited in your work or other regular daily activities as a result of your arm, shoulder or hand problem? 4 = Severe  Tingling (pins and needles) in your arm, shoulder or hand. 2 = Mild  During the past week, how much difficulty have you had sleeping because of the pain in your arm, shoulder or hand?  2 = Mild difficulty   (A QuickDASH score may not be calculated if there is greater than 1 missing item.)  Quick Dash Disability/Symptom Score: [(sum of  (48) responses/11 (n)] -1] x 25 = 84%  Minimally Clinically Important Difference (MCID): 15-20 points  (Franchignoni, F. et al. (2013). Minimally clinically important difference of the disabilities of the arm, shoulder, and hand outcome measures (DASH) and its shortened version (Quick DASH). Journal of Orthopaedic & Sports Physical Therapy, 44(1), 30-39)   COGNITION: Overall cognitive status: Within functional limits for tasks assessed     SENSATION: Not tested  POSTURE: No postural deficits noted   UPPER EXTREMITY ROM:   Active ROM Right eval Left eval  Shoulder flexion NT/45 deg 180  Shoulder extension    Shoulder abduction NT/45 deg   Shoulder adduction    Shoulder internal rotation    Shoulder external rotation  Elbow  flexion NT/90   Elbow extension NT/0   Wrist flexion    Wrist extension    Wrist ulnar deviation    Wrist radial deviation    Wrist pronation    Wrist supination    (Blank rows = not tested)  UPPER EXTREMITY MMT:  MMT Right eval Left eval  Shoulder flexion  5  Shoulder extension    Shoulder abduction  5  Shoulder adduction    Shoulder internal rotation  5  Shoulder external rotation  5  Middle trapezius    Lower trapezius    Elbow flexion    Elbow extension    Wrist flexion    Wrist extension    Wrist ulnar deviation    Wrist radial deviation    Wrist pronation    Wrist supination    Grip strength (lbs)                                                                                                    TREATMENT DATE: All exercises performed on RUE only   07/02/23: All exercises performed on RUE    THEREX  Shoulder Flexion + Extension PROM Cradles x 30  Shoulder Abduction + Adduction PROM Cradles x 30  Left Shoulder MMT  Shoulder Flex 5/5 Shoulder Abd 5/5  Shoulder IR at 0 deg 5/5  Shoulder ER at 0 deg 5/5    Shoulder Flexion and Extension PROM Towel Slides  2 x 10   Shoulder Abduction and Adduction PROM Towel Slides  2 x 10  Right Upper Trap Stretch 3 x 60 sec   HOME CARE SELF MANAGEMENT  Use of theracane for self trigger point release of left upper trap and periscapular musculate   Education on placing pillow or towel beneath elbow to avoid shoulder hyper extending even if in sling.   06/27/23: THEREX   Shoulder Flexion and Extension Pendulums in bent over position x 30  Shoulder Abduction and Adduction Pendulums in bent over position x 30  Shoulder Cradles Flexion and Extension in bent over position x 30  Shoulder Cradles Abduction and Adduction in bent over position x 30  Scapular Squeezes 1 x 10     PATIENT EDUCATION: Education details:  Form and technique for correct performance of exercise and explanation about post op protocol  Person educated:  Patient Education method: Explanation, Demonstration, and Verbal cues Education comprehension: verbalized understanding, returned demonstration, and verbal cues required  HOME EXERCISE PROGRAM: Access Code: [email or web address] URL: [email or web address] Date: 07/02/2023 Prepared by: Toribio Servant  Exercises - Seated Cervical Sidebending Stretch  - 1 x daily - 7 x weekly - 3 reps - 30-60 sec  hold - Supported Elbow Flexion Extension PROM  - 1 x daily - 7 x weekly - 3 sets - 10 reps - Seated Scapular Retraction  - 1 x daily - 7 x weekly - 2 sets - 10 reps - 3 sec hold - Standing Single Arm Slides on Railing   - 1 x daily - 7 x weekly - 2 sets - 10 reps - Seated Shoulder Abduction Towel  Slide at Table Top  - 1 x daily - 7 x weekly - 2 sets - 10 reps - Seated Wrist Supination PROM  - 1 x daily - 7 x weekly - 3 sets - 10 reps - Horizontal Shoulder Pendulum with Table Support  - 1 x daily - 7 x weekly - 3 sets - 10 reps - Gentle Vertical Shoulder Pendulum  - 1 x daily - 7 x weekly - 3 sets - 10 reps  ASSESSMENT:  CLINICAL IMPRESSION: Pt presents POD #11 from right shoulder arthroscopy with subacromial decompression, rotator cuff debridement, biceps tenodesis, and right SLAP repair. He continues to demonstrate excellent progress towards goals with ability to perform all exercises without being limited by pain. Per protocol, pt is only performing PROM exercises with no ER beyond 40 degrees or shoulder extension past neutral. He will benefit from skilled PT to address these aforementioned deficits to regain his right shoulder's prior level of function to return to lifting weights with right shoulder and to typing for his job.  OBJECTIVE IMPAIRMENTS: decreased knowledge of condition, decreased ROM, decreased strength, increased muscle spasms, impaired UE functional use, and pain.   ACTIVITY LIMITATIONS: carrying, lifting, bathing, toileting, dressing, reach over head, and  hygiene/grooming  PARTICIPATION LIMITATIONS: meal prep, cleaning, laundry, driving, shopping, community activity, occupation, and yard work  PERSONAL FACTORS: Age, Education, Fitness, and 1 comorbidity: Gout are also affecting patient's functional outcome.   REHAB POTENTIAL: Excellent  CLINICAL DECISION MAKING: Stable/uncomplicated  EVALUATION COMPLEXITY: Low   GOALS: Goals reviewed with patient? No  SHORT TERM GOALS: Target date: 07/11/2023  Patient will demonstrate undestanding of home exercise plan by performing exercises correctly with evidence of good carry over with min to no verbal or tactile cues .   Baseline: NT  Goal status: INITIAL  LONG TERM GOALS: Target date: 09/19/2023  Patient will demonstrate a decrease of >=15 pts on QuickDash for right shoulder as evidence of improved right shoulder function.   Baseline: 84% or 48 pts total  Goal status: INITIAL  2.  Patient will demonstrate right shoulder AROM that is nearly symmetrical (within 10% of L shoulder ROM) to left shoulder for improved right shoulder function to perform reaching and mouse manipulation tasks for his job.  Baseline: Shoulder Flex PROM R 45, Shoulder Abd R 45, AROM not performed at this point due to protocol Goal status: INITIAL  3.  Patient will demonstrate right shoulder MMT that is symmetrical to left shoulder for improved right shoulder function to perform reaching and mouse manipulation tasks for his job.  Baseline: MMT not performed yet   Goal status: INITIAL  4.  Patient will be able to return to performing upper extremity weight lifting routine with RUE as evidence of improved right shoulder function to maintain physical health.  Baseline: Unable to perform   Goal status: INITIAL  5.  Patient will be able to return to using right shoulder for reaching, typing and manipulating mouse for work related tasks as evidence of improved right shoulder function. Baseline: Can perform some typing but  not manipulation of mouse at moment  Goal status: INITIAL   PLAN:  PT FREQUENCY: 1-2x/week  PT DURATION: 12 weeks  PLANNED INTERVENTIONS: 97164- PT Re-evaluation, 97750- Physical Performance Testing, 97110-Therapeutic exercises, 97530- Therapeutic activity, [id number]- Neuromuscular re-education, 97535- Self Care, [id number]- Manual therapy, [id number]- Aquatic Therapy, [id number]- Electrical stimulation (unattended), [id number]- Electrical stimulation (manual), [id number]- Traction (mechanical), [id number] (1-2 muscles), [id number] (3+ muscles)- Dry Needling, Patient/Family education, Balance training, Taping, Joint  mobilization, Joint manipulation, Spinal manipulation, Spinal mobilization, Cryotherapy, and Moist heat  PLAN FOR NEXT SESSION: Left shoulder MMT and periscapular MMT. Upper Trap Stretch. Review PROM restrictions from Bicep's Tenodesis protocol no shoulder ER PROM beyond 40 deg   Toribio Servant PT, DPT  [facility] [address], [place], [address] Phone: [phone]   Fax:  [phone]

## 2023-07-04 ENCOUNTER — Ambulatory Visit: Admitting: Physical Therapy

## 2023-07-11 ENCOUNTER — Ambulatory Visit: Attending: Orthopaedic Surgery | Admitting: Physical Therapy

## 2023-07-11 DIAGNOSIS — M25511 Pain in right shoulder: Secondary | ICD-10-CM | POA: Insufficient documentation

## 2023-07-11 DIAGNOSIS — Z9889 Other specified postprocedural states: Secondary | ICD-10-CM | POA: Insufficient documentation

## 2023-07-11 NOTE — Therapy (Signed)
 OUTPATIENT PHYSICAL THERAPY SHOULDER TREATMENT   Patient Name: Lonnie Castro MRN: [id number] DOB:17-Jul-1965, 58 y.o., male Today's Date: 07/11/2023  END OF SESSION:  PT End of Session - 07/11/23 1122     Visit Number 3    Number of Visits 25    Date for PT Re-Evaluation 09/19/23    Authorization Type [id number]    Authorization - Visit Number 3    Authorization - Number of Visits 25    Progress Note Due on Visit 10    PT Start Time 1120    PT Stop Time 1200    PT Time Calculation (min) 40 min    Activity Tolerance Patient tolerated treatment well    Behavior During Therapy [facility] for tasks assessed/performed           Past Medical History:  Diagnosis Date   Allergic rhinitis    Arthritis    Bronchitis 04/04/2021   Gout    Orchitis    Other fatigue 04/04/2021   Past Surgical History:  Procedure Laterality Date   COLONOSCOPY WITH PROPOFOL  N/A 11/03/2014   Procedure: COLONOSCOPY WITH PROPOFOL ;  Surgeon: Gladis RAYMOND Mariner, MD;  Location: [facility] ENDOSCOPY;  Service: Endoscopy;  Laterality: N/A;   COLONOSCOPY WITH PROPOFOL  N/A 05/04/2022   Procedure: COLONOSCOPY WITH PROPOFOL ;  Surgeon: Unk Corinn Skiff, MD;  Location: [facility] ENDOSCOPY;  Service: Gastroenterology;  Laterality: N/A;   FRACTURE SURGERY     HERNIA REPAIR  01/02/1982   NASAL SEPTUM SURGERY  01/02/1985   SHOULDER ARTHROSCOPY WITH ROTATOR CUFF REPAIR Right 06/21/2023   Procedure: ARTHROSCOPY, SHOULDER, WITH ROTATOR CUFF REPAIR;  Surgeon: Cristy Bonner DASEN, MD;  Location:  SURGERY CENTER;  Service: Orthopedics;  Laterality: Right;   Patient Active Problem List   Diagnosis Date Noted   Screen for colon cancer 05/04/2022   Tinnitus 06/18/2015   Preventative health care 08/28/2014   Hyperlipidemia 08/28/2014   Eczema of external ear 08/28/2014   Chronic gout 05/25/2014   Rhinitis, allergic 05/25/2014    PCP: Comer Gaskins NP   REFERRING PROVIDER: Dr. Bonner Cristy   REFERRING DIAG: [id number] (ICD-10-CM) - S/P  arthroscopy of right shoulder  THERAPY DIAG:  Acute pain of right shoulder  Rationale for Evaluation and Treatment: Rehabilitation  ONSET DATE: 06/21/23  SUBJECTIVE:                                                                                                                                                                                      SUBJECTIVE STATEMENT: See pertinent   Hand dominance: Right  PERTINENT HISTORY: Pt referred to therapy following right shoulder arthroscopy with a/c joint decompression, RTC debridement, biceps  tenodesis and SLAP repair. His pain is well controlled and he presents with right arm in sling. He wants to return to lifting at gym and using his RUE for job related tasks like typing and moving mouse around.   PAIN:  Are you having pain? Yes: NPRS scale: 4-5/10 Pain location: Right anterior shoulder  Pain description: Achy  Aggravating factors: Pain comes and goes   Relieving factors: Keeping arm still and taking pain medications    PRECAUTIONS: Shoulder -No AAROM or AROM of right shoulder until week 5 and/or cleared by surgeon   RED FLAGS: None   WEIGHT BEARING RESTRICTIONS:  No weight bearing through RUE    FALLS:  Has patient fallen in last 6 months? No  LIVING ENVIRONMENT: Lives with: lives with their spouse Lives in: House/apartment Stairs: Did not ask  Has following equipment at home: Did not ask   OCCUPATION: Psychologist, sport and exercise    PLOF: Independent  PATIENT GOALS: Wants to   NEXT MD VISIT:   OBJECTIVE:  Note: Objective measures were completed at Evaluation unless otherwise noted.  VITALS: BP 129/75 HR 77 Sp02 97%  DIAGNOSTIC FINDINGS:  No imaging listed in chart for right shoulder  PATIENT SURVEYS:  Quick Dash:  QUICK DASH  Please rate your ability do the following activities in the last week by selecting the number below the appropriate response.   Activities Rating  Open a tight or new jar.  5 = Unable  Do heavy  household chores (e.g., wash walls, floors). 5 = Unable  Carry a shopping bag or briefcase 5 = Unable  Wash your back. 5 = Unable  Use a knife to cut food. 5 = Unable  Recreational activities in which you take some force or impact through your arm, shoulder or hand (e.g., golf, hammering, tennis, etc.). 5 = Unable  During the past week, to what extent has your arm, shoulder or hand problem interfered with your normal social activities with family, friends, neighbors or groups?  5 = Extremely  During the past week, were you limited in your work or other regular daily activities as a result of your arm, shoulder or hand problem? 5 = Unable  During the past week, were you limited in your work or other regular daily activities as a result of your arm, shoulder or hand problem? 4 = Severe  Tingling (pins and needles) in your arm, shoulder or hand. 2 = Mild  During the past week, how much difficulty have you had sleeping because of the pain in your arm, shoulder or hand?  2 = Mild difficulty   (A QuickDASH score may not be calculated if there is greater than 1 missing item.)  Quick Dash Disability/Symptom Score: [(sum of  (48) responses/11 (n)] -1] x 25 = 84%  Minimally Clinically Important Difference (MCID): 15-20 points  (Franchignoni, F. et al. (2013). Minimally clinically important difference of the disabilities of the arm, shoulder, and hand outcome measures (DASH) and its shortened version (Quick DASH). Journal of Orthopaedic & Sports Physical Therapy, 44(1), 30-39)   COGNITION: Overall cognitive status: Within functional limits for tasks assessed     SENSATION: Not tested  POSTURE: No postural deficits noted   UPPER EXTREMITY ROM:   Active ROM Right eval Left eval  Shoulder flexion NT/45 deg 180  Shoulder extension    Shoulder abduction NT/45 deg   Shoulder adduction    Shoulder internal rotation    Shoulder external rotation    Elbow flexion NT/90  Elbow extension NT/0    Wrist flexion    Wrist extension    Wrist ulnar deviation    Wrist radial deviation    Wrist pronation    Wrist supination    (Blank rows = not tested)  UPPER EXTREMITY MMT:  MMT Right eval Left eval  Shoulder flexion  5  Shoulder extension    Shoulder abduction  5  Shoulder adduction    Shoulder internal rotation  5  Shoulder external rotation  5  Middle trapezius    Lower trapezius    Elbow flexion    Elbow extension    Wrist flexion    Wrist extension    Wrist ulnar deviation    Wrist radial deviation    Wrist pronation    Wrist supination    Grip strength (lbs)                                                                                                    TREATMENT DATE: All exercises performed on RUE only   07/11/23  [facility]NAGEMENT  -Answered patient's about post op care:   1. Reminded patient of post op protocol precautions especially for biceps tenodesis         A. No AROM shoulder and elbow         B. No shoulder external rotation beyond 40 deg          C. No shoulder extension or horizontal abduction past neutral         D. Place towel under shoulder while laying flat to prevent extension         E. No friction massage of bicep's insertion site         F. No lifting objects     2. Reminded him to wear sling at all times except when performing exercises or taking shower.   THEREX: All exercises performed on RUE  Shoulder Flex/Ext PROM  2 x 10  Seated Shoulder ER PROM TO 40 deg in plane of scaption and at 45 deg abduction 2 x 10      PATIENT EDUCATION: Education details:  Form and technique for correct performance of exercise and explanation about post op protocol  Person educated: Patient Education method: Explanation, Demonstration, and Verbal cues Education comprehension: verbalized understanding, returned demonstration, and verbal cues required  HOME EXERCISE PROGRAM: Access Code: [email or web address] URL:  [email or web address] Date: 07/02/2023 Prepared by: Toribio Servant  Exercises - Seated Cervical Sidebending Stretch  - 1 x daily - 7 x weekly - 3 reps - 30-60 sec  hold - Supported Elbow Flexion Extension PROM  - 1 x daily - 7 x weekly - 3 sets - 10 reps - Seated Scapular Retraction  - 1 x daily - 7 x weekly - 2 sets - 10 reps - 3 sec hold - Standing Single Arm Slides on Railing   - 1 x daily - 7 x weekly - 2 sets - 10 reps - Seated Shoulder Abduction Towel Slide at Table Top  - 1 x daily - 7 x weekly - 2  sets - 10 reps - Seated Wrist Supination PROM  - 1 x daily - 7 x weekly - 3 sets - 10 reps - Horizontal Shoulder Pendulum with Table Support  - 1 x daily - 7 x weekly - 3 sets - 10 reps - Gentle Vertical Shoulder Pendulum  - 1 x daily - 7 x weekly - 3 sets - 10 reps  ASSESSMENT:  CLINICAL IMPRESSION: Pt presents s/p 3 weeks  right shoulder arthroscopy with subacromial decompression, rotator cuff debridement, biceps tenodesis, and right SLAP repair. Pt's pain continues to be well controlled. PT continues to educate pt on need to adhere to post op protocols to achieve optimal post op outcomes and to continue to wear sling at all times with exception of showering or exercising. He will to benefit from skilled PT to address these aforementioned deficits to regain his right shoulder's prior level of function to return to lifting weights with right shoulder and to typing for his job.   OBJECTIVE IMPAIRMENTS: decreased knowledge of condition, decreased ROM, decreased strength, increased muscle spasms, impaired UE functional use, and pain.   ACTIVITY LIMITATIONS: carrying, lifting, bathing, toileting, dressing, reach over head, and hygiene/grooming  PARTICIPATION LIMITATIONS: meal prep, cleaning, laundry, driving, shopping, community activity, occupation, and yard work  PERSONAL FACTORS: Age, Education, Fitness, and 1 comorbidity: Gout are also affecting patient's functional outcome.    REHAB POTENTIAL: Excellent  CLINICAL DECISION MAKING: Stable/uncomplicated  EVALUATION COMPLEXITY: Low   GOALS: Goals reviewed with patient? No  SHORT TERM GOALS: Target date: 07/11/2023  Patient will demonstrate undestanding of home exercise plan by performing exercises correctly with evidence of good carry over with min to no verbal or tactile cues .   Baseline: NT 07/11/23: Performing exercises independently  Goal status: ACHIEVED   LONG TERM GOALS: Target date: 09/19/2023  Patient will demonstrate a decrease of >=15 pts on QuickDash for right shoulder as evidence of improved right shoulder function.   Baseline: 84% or 48 pts total  Goal status: ONGOING   2.  Patient will demonstrate right shoulder AROM that is nearly symmetrical (within 10% of L shoulder ROM) to left shoulder for improved right shoulder function to perform reaching and mouse manipulation tasks for his job.  Baseline: Shoulder Flex PROM R 45, Shoulder Abd R 45, AROM not performed at this point due to protocol Goal status: ONGOING   3.  Patient will demonstrate right shoulder MMT that is symmetrical to left shoulder for improved right shoulder function to perform reaching and mouse manipulation tasks for his job.  Baseline: MMT not performed yet   Goal status: ONGOING   4.  Patient will be able to return to performing upper extremity weight lifting routine with RUE as evidence of improved right shoulder function to maintain physical health.  Baseline: Unable to perform   Goal status: ONGOING   5.  Patient will be able to return to using right shoulder for reaching, typing and manipulating mouse for work related tasks as evidence of improved right shoulder function. Baseline: Can perform some typing but not manipulation of mouse at moment  Goal status: ONGOING     PLAN:  PT FREQUENCY: 1-2x/week  PT DURATION: 12 weeks  PLANNED INTERVENTIONS: 97164- PT Re-evaluation, 97750- Physical Performance Testing,  97110-Therapeutic exercises, 97530- Therapeutic activity, [id number]- Neuromuscular re-education, 97535- Self Care, [id number]- Manual therapy, [id number]- Aquatic Therapy, [id number]- Electrical stimulation (unattended), [id number]- Electrical stimulation (manual), [id number]- Traction (mechanical), [id number] (1-2 muscles), 20561 (3+ muscles)- Dry Needling, Patient/Family education, Balance training,  Taping, Joint mobilization, Joint manipulation, Spinal manipulation, Spinal mobilization, Cryotherapy, and Moist heat  PLAN FOR NEXT SESSION:  Measure shoulder ROM. Begin intermediate post op phase II: Shoulder AAROM, Shoulder AROM (flex only to 90), Elbow AROM.  Left shoulder MMT and periscapular MMT.   Toribio Servant PT, DPT  [facility] [address], [place], [address] Phone: [phone]   Fax:  [phone]

## 2023-07-18 ENCOUNTER — Ambulatory Visit

## 2023-07-18 DIAGNOSIS — Z9889 Other specified postprocedural states: Secondary | ICD-10-CM

## 2023-07-18 DIAGNOSIS — M25511 Pain in right shoulder: Secondary | ICD-10-CM | POA: Diagnosis not present

## 2023-07-18 NOTE — Therapy (Signed)
 OUTPATIENT PHYSICAL THERAPY TREATMENT   Patient Name: Lonnie Castro MRN: [id number] DOB:Sep 08, 1965, 58 y.o., male Today's Date: 07/18/2023  END OF SESSION:  PT End of Session - 07/18/23 0906     Visit Number 4    Number of Visits 25    Date for PT Re-Evaluation 09/19/23    Authorization Type BCBS [id number]    Authorization Time Period BCBS COMM Pro    Authorization - Visit Number 4    Authorization - Number of Visits 25    Progress Note Due on Visit 10    PT Start Time 0900    PT Stop Time 0940    PT Time Calculation (min) 40 min    Activity Tolerance Patient tolerated treatment well;No increased pain    Behavior During Therapy [facility] for tasks assessed/performed           Past Medical History:  Diagnosis Date   Allergic rhinitis    Arthritis    Bronchitis 04/04/2021   Gout    Orchitis    Other fatigue 04/04/2021   Past Surgical History:  Procedure Laterality Date   COLONOSCOPY WITH PROPOFOL  N/A 11/03/2014   Procedure: COLONOSCOPY WITH PROPOFOL ;  Surgeon: Gladis RAYMOND Mariner, MD;  Location: [facility] ENDOSCOPY;  Service: Endoscopy;  Laterality: N/A;   COLONOSCOPY WITH PROPOFOL  N/A 05/04/2022   Procedure: COLONOSCOPY WITH PROPOFOL ;  Surgeon: Unk Corinn Skiff, MD;  Location: [facility] ENDOSCOPY;  Service: Gastroenterology;  Laterality: N/A;   FRACTURE SURGERY     HERNIA REPAIR  01/02/1982   NASAL SEPTUM SURGERY  01/02/1985   SHOULDER ARTHROSCOPY WITH ROTATOR CUFF REPAIR Right 06/21/2023   Procedure: ARTHROSCOPY, SHOULDER, WITH ROTATOR CUFF REPAIR;  Surgeon: Cristy Bonner DASEN, MD;  Location:  SURGERY CENTER;  Service: Orthopedics;  Laterality: Right;   Patient Active Problem List   Diagnosis Date Noted   Screen for colon cancer 05/04/2022   Tinnitus 06/18/2015   Preventative health care 08/28/2014   Hyperlipidemia 08/28/2014   Eczema of external ear 08/28/2014   Chronic gout 05/25/2014   Rhinitis, allergic 05/25/2014    PCP: Comer Gaskins NP   REFERRING PROVIDER: Dr.  Bonner Cristy   REFERRING DIAG: [phone] (ICD-10-CM) - S/P arthroscopy of right shoulder  THERAPY DIAG:  Acute pain of right shoulder  S/P arthroscopy of right shoulder  Rationale for Evaluation and Treatment: Rehabilitation  ONSET DATE: 06/21/23  SUBJECTIVE:                                                                                                                                                                                      SUBJECTIVE STATEMENT: Pt feels good. His compliance with HEP has been  acceptable. He denies any complications with this.   PERTINENT HISTORY: Pt referred to therapy following right shoulder arthroscopy with a/c joint decompression, RTC debridement, biceps tenodesis and SLAP repair. His pain is well controlled and he presents with right arm in sling. He wants to return to lifting at gym and using his RUE for job related tasks like typing and moving mouse around.   PAIN:  Are you having pain?  No real pain at rest today   PRECAUTIONS: Shoulder -No AAROM or AROM of right shoulder until week 5 and/or cleared by surgeon   WEIGHT BEARING RESTRICTIONS:  No weight bearing through RUE    FALLS:  Has patient fallen in last 6 months? No  LIVING ENVIRONMENT: Lives with: lives with their spouse Lives in: House/apartment Stairs: Did not ask  Has following equipment at home: Did not ask   OCCUPATION: Psychologist, sport and exercise    PLOF: Independent  PATIENT GOALS: Wants to return to gym for UE exercise.   NEXT MD VISIT:   OBJECTIVE:  Note: Objective measures were completed at Evaluation unless otherwise noted.  PATIENT SURVEYS:  Quick Dash:   Please rate your ability do the following activities in the last week by selecting the number below the appropriate response.   Activities Rating  Open a tight or new jar.  5 = Unable  Do heavy household chores (e.g., wash walls, floors). 5 = Unable  Carry a shopping bag or briefcase 5 = Unable  Wash your back. 5 =  Unable  Use a knife to cut food. 5 = Unable  Recreational activities in which you take some force or impact through your arm, shoulder or hand (e.g., golf, hammering, tennis, etc.). 5 = Unable  During the past week, to what extent has your arm, shoulder or hand problem interfered with your normal social activities with family, friends, neighbors or groups?  5 = Extremely  During the past week, were you limited in your work or other regular daily activities as a result of your arm, shoulder or hand problem? 5 = Unable  During the past week, were you limited in your work or other regular daily activities as a result of your arm, shoulder or hand problem? 4 = Severe  Tingling (pins and needles) in your arm, shoulder or hand. 2 = Mild  During the past week, how much difficulty have you had sleeping because of the pain in your arm, shoulder or hand?  2 = Mild difficulty   (A QuickDASH score may not be calculated if there is greater than 1 missing item.)  Quick Dash Disability/Symptom Score: [(sum of  (48) responses/11 (n)] -1] x 25 = 84%  Minimally Clinically Important Difference (MCID): 15-20 points  (Franchignoni, F. et al. (2013). Minimally clinically important difference of the disabilities of the arm, shoulder, and hand outcome measures (DASH) and its shortened version (Quick DASH). Journal of Orthopaedic & Sports Physical Therapy, 44(1), 30-39)   COGNITION: Overall cognitive status: Within functional limits for tasks assessed     SENSATION: Not tested  POSTURE: No postural deficits noted   UPPER EXTREMITY ROM:   Active ROM Right eval Left eval  Shoulder flexion NT/45 deg 180  Shoulder extension    Shoulder abduction NT/45 deg   Shoulder adduction    Shoulder internal rotation    Shoulder external rotation    Elbow flexion NT/90   Elbow extension NT/0   Wrist flexion    Wrist extension    Wrist ulnar deviation  Wrist radial deviation    Wrist pronation    Wrist  supination    (Blank rows = not tested)  UPPER EXTREMITY MMT:  MMT Right eval Left eval  Shoulder flexion  5  Shoulder extension    Shoulder abduction  5  Shoulder adduction    Shoulder internal rotation  5  Shoulder external rotation  5                                                                                                  TREATMENT DATE 07/18/23 :  -P/ROM Rt shoulder flexion seated pendulums 20x5secH  -P/ROM Rt shoulder ABDCT 10x15secH (up to ~75 degrees)  -Supine Rt elbow flexion/extension A/ROM x20 -Supine Rt shoulder ER P/ROM 20x3secH  (tolerates up to 35 degrees)  -Supine Rt forearm supination/pronation A/ROM elbow at 90 degrees x20    07/11/23  [facility]NAG[facility]  -Answered patient's about post op care:   1. Reminded patient of post op protocol precautions especially for biceps tenodesis         A. No AROM shoulder and elbow         B. No shoulder external rotation beyond 40 deg          C. No shoulder extension or horizontal abduction past neutral         D. Place towel under shoulder while laying flat to prevent extension         E. No friction massage of bicep's insertion site         F. No lifting objects     2. Reminded him to wear sling at all times except when performing exercises or taking shower.   THEREX: All exercises performed on RUE  Shoulder Flex/Ext PROM  2 x 10  Seated Shoulder ER PROM TO 40 deg in plane of scaption and at 45 deg abduction 2 x 10      PATIENT EDUCATION: Education details:  Form and technique for correct performance of exercise and explanation about post op protocol  Person educated: Patient Education method: Explanation, Demonstration, and Verbal cues Education comprehension: verbalized understanding, returned demonstration, and verbal cues required  HOME EXERCISE PROGRAM: Access Code: [email or web address] URL: [email or web address] Date: 07/02/2023 Prepared by: Toribio Servant  Exercises - Seated Cervical  Sidebending Stretch  - 1 x daily - 7 x weekly - 3 reps - 30-60 sec  hold - Supported Elbow Flexion Extension PROM  - 1 x daily - 7 x weekly - 3 sets - 10 reps - Seated Scapular Retraction  - 1 x daily - 7 x weekly - 2 sets - 10 reps - 3 sec hold - Standing Single Arm Slides on Railing   - 1 x daily - 7 x weekly - 2 sets - 10 reps - Seated Shoulder Abduction Towel Slide at Table Top  - 1 x daily - 7 x weekly - 2 sets - 10 reps - Seated Wrist Supination PROM  - 1 x daily - 7 x weekly - 3 sets - 10 reps - Horizontal Shoulder Pendulum with Table Support  - 1  x daily - 7 x weekly - 3 sets - 10 reps - Gentle Vertical Shoulder Pendulum  - 1 x daily - 7 x weekly - 3 sets - 10 reps  ASSESSMENT:  CLINICAL IMPRESSION: Protocol congruent. Pain at goal. HEP compliant. On-track as anticipated per surgeon. He will to benefit from skilled PT to address these aforementioned deficits to regain his right shoulder's prior level of function to return to lifting weights with right shoulder and to typing for his job.   OBJECTIVE IMPAIRMENTS: decreased knowledge of condition, decreased ROM, decreased strength, increased muscle spasms, impaired UE functional use, and pain.   ACTIVITY LIMITATIONS: carrying, lifting, bathing, toileting, dressing, reach over head, and hygiene/grooming  PARTICIPATION LIMITATIONS: meal prep, cleaning, laundry, driving, shopping, community activity, occupation, and yard work  PERSONAL FACTORS: Age, Education, Fitness, and 1 comorbidity: Gout are also affecting patient's functional outcome.   REHAB POTENTIAL: Excellent  CLINICAL DECISION MAKING: Stable/uncomplicated  EVALUATION COMPLEXITY: Low   GOALS: Goals reviewed with patient? No  SHORT TERM GOALS: Target date: 07/11/2023  Patient will demonstrate undestanding of home exercise plan by performing exercises correctly with evidence of good carry over with min to no verbal or tactile cues .   Baseline: NT 07/11/23: Performing  exercises independently  Goal status: ACHIEVED   LONG TERM GOALS: Target date: 09/19/2023  Patient will demonstrate a decrease of >=15 pts on QuickDash for right shoulder as evidence of improved right shoulder function.  Baseline: 84% or 48 pts total  Goal status: ONGOING   2.  Patient will demonstrate right shoulder AROM that is nearly symmetrical (within 10% of L shoulder ROM) to left shoulder for improved right shoulder function to perform reaching and mouse manipulation tasks for his job.  Baseline: Shoulder Flex PROM R 45, Shoulder Abd R 45, AROM not performed at this point due to protocol Goal status: ONGOING   3.  Patient will demonstrate right shoulder MMT that is symmetrical to left shoulder for improved right shoulder function to perform reaching and mouse manipulation tasks for his job.  Baseline: MMT not performed yet   Goal status: ONGOING   4.  Patient will be able to return to performing upper extremity weight lifting routine with RUE as evidence of improved right shoulder function to maintain physical health.  Baseline: Unable to perform   Goal status: ONGOING   5.  Patient will be able to return to using right shoulder for reaching, typing and manipulating mouse for work related tasks as evidence of improved right shoulder function. Baseline: Can perform some typing but not manipulation of mouse at moment  Goal status: ONGOING    PLAN:  PT FREQUENCY: 1-2x/week  PT DURATION: 12 weeks  PLANNED INTERVENTIONS: 97164- PT Re-evaluation, 97750- Physical Performance Testing, 97110-Therapeutic exercises, 97530- Therapeutic activity, 97112- Neuromuscular re-education, 97535- Self Care, 02859- Manual therapy, [id number]- Aquatic Therapy, H9716- Electrical stimulation (unattended), [phone]- Electrical stimulation (manual), [id number]- Traction (mechanical), 20560 (1-2 muscles), 20561 (3+ muscles)- Dry Needling, Patient/Family education, Balance training, Taping, Joint mobilization, Joint  manipulation, Spinal manipulation, Spinal mobilization, Cryotherapy, and Moist heat  PLAN FOR NEXT SESSION:  Measure shoulder ROM. Begin intermediate post op phase II: Shoulder AAROM, Shoulder AROM (flex only to 90), Elbow AROM.  Left shoulder MMT and periscapular MMT.   9:10 AM, 07/18/23 Peggye JAYSON Linear, PT, DPT Physical Therapist -  [phone] (Office)

## 2023-07-25 ENCOUNTER — Encounter: Payer: Self-pay | Admitting: Physical Therapy

## 2023-07-25 ENCOUNTER — Ambulatory Visit: Admitting: Physical Therapy

## 2023-07-25 DIAGNOSIS — M25511 Pain in right shoulder: Secondary | ICD-10-CM | POA: Diagnosis not present

## 2023-07-25 DIAGNOSIS — Z9889 Other specified postprocedural states: Secondary | ICD-10-CM | POA: Diagnosis not present

## 2023-07-25 NOTE — Therapy (Addendum)
 OUTPATIENT PHYSICAL THERAPY TREATMENT   Patient Name: Lonnie Castro MRN: [id number] DOB:06/05/1965, 58 y.o., male Today's Date: 07/25/2023  END OF SESSION:  PT End of Session - 07/25/23 1656     Visit Number 5    Number of Visits 25    Date for PT Re-Evaluation 09/19/23    Authorization Type BCBS 2025    Authorization Time Period BCBS COMM Pro    Authorization - Visit Number 5    Authorization - Number of Visits 25    Progress Note Due on Visit 10    PT Start Time 1650    PT Stop Time 1730    PT Time Calculation (min) 40 min    Activity Tolerance Patient tolerated treatment well;No increased pain    Behavior During Therapy [facility] for tasks assessed/performed           Past Medical History:  Diagnosis Date   Allergic rhinitis    Arthritis    Bronchitis 04/04/2021   Gout    Orchitis    Other fatigue 04/04/2021   Past Surgical History:  Procedure Laterality Date   COLONOSCOPY WITH PROPOFOL  N/A 11/03/2014   Procedure: COLONOSCOPY WITH PROPOFOL ;  Surgeon: Gladis RAYMOND Mariner, MD;  Location: [facility] ENDOSCOPY;  Service: Endoscopy;  Laterality: N/A;   COLONOSCOPY WITH PROPOFOL  N/A 05/04/2022   Procedure: COLONOSCOPY WITH PROPOFOL ;  Surgeon: Unk Corinn Skiff, MD;  Location: [facility] ENDOSCOPY;  Service: Gastroenterology;  Laterality: N/A;   FRACTURE SURGERY     HERNIA REPAIR  01/02/1982   NASAL SEPTUM SURGERY  01/02/1985   SHOULDER ARTHROSCOPY WITH ROTATOR CUFF REPAIR Right 06/21/2023   Procedure: ARTHROSCOPY, SHOULDER, WITH ROTATOR CUFF REPAIR;  Surgeon: Cristy Bonner DASEN, MD;  Location: [facility];  Service: Orthopedics;  Laterality: Right;   Patient Active Problem List   Diagnosis Date Noted   Screen for colon cancer 05/04/2022   Tinnitus 06/18/2015   Preventative health care 08/28/2014   Hyperlipidemia 08/28/2014   Eczema of external ear 08/28/2014   Chronic gout 05/25/2014   Rhinitis, allergic 05/25/2014    PCP: Comer Gaskins NP   REFERRING PROVIDER: Dr.  Bonner Cristy   REFERRING DIAG: [phone] (ICD-10-CM) - S/P arthroscopy of right shoulder  THERAPY DIAG:  Acute pain of right shoulder  S/P arthroscopy of right shoulder  Rationale for Evaluation and Treatment: Rehabilitation  ONSET DATE: 06/21/23  SUBJECTIVE:                                                                                                                                                                                      SUBJECTIVE STATEMENT: Pt continues to feel good with minimal pain.  PERTINENT HISTORY: Pt referred to therapy following right shoulder arthroscopy with a/c joint decompression, RTC debridement, biceps tenodesis and SLAP repair. His pain is well controlled and he presents with right arm in sling. He wants to return to lifting at gym and using his RUE for job related tasks like typing and moving mouse around.   PAIN:  Are you having pain?  No real pain at rest today   PRECAUTIONS: Shoulder -No AAROM or AROM of right shoulder until week 5 and/or cleared by surgeon   WEIGHT BEARING RESTRICTIONS:  No weight bearing through RUE    FALLS:  Has patient fallen in last 6 months? No  LIVING ENVIRONMENT: Lives with: lives with their spouse Lives in: House/apartment Stairs: Did not ask  Has following equipment at home: Did not ask   OCCUPATION: Psychologist, sport and exercise    PLOF: Independent  PATIENT GOALS: Wants to return to gym for UE exercise.   NEXT MD VISIT:   OBJECTIVE:  Note: Objective measures were completed at Evaluation unless otherwise noted.  PATIENT SURVEYS:  Quick Dash:   Please rate your ability do the following activities in the last week by selecting the number below the appropriate response.   Activities Rating  Open a tight or new jar.  5 = Unable  Do heavy household chores (e.g., wash walls, floors). 5 = Unable  Carry a shopping bag or briefcase 5 = Unable  Wash your back. 5 = Unable  Use a knife to cut food. 5 = Unable   Recreational activities in which you take some force or impact through your arm, shoulder or hand (e.g., golf, hammering, tennis, etc.). 5 = Unable  During the past week, to what extent has your arm, shoulder or hand problem interfered with your normal social activities with family, friends, neighbors or groups?  5 = Extremely  During the past week, were you limited in your work or other regular daily activities as a result of your arm, shoulder or hand problem? 5 = Unable  During the past week, were you limited in your work or other regular daily activities as a result of your arm, shoulder or hand problem? 4 = Severe  Tingling (pins and needles) in your arm, shoulder or hand. 2 = Mild  During the past week, how much difficulty have you had sleeping because of the pain in your arm, shoulder or hand?  2 = Mild difficulty   (A QuickDASH score may not be calculated if there is greater than 1 missing item.)  Quick Dash Disability/Symptom Score: [(sum of  (48) responses/11 (n)] -1] x 25 = 84%  Minimally Clinically Important Difference (MCID): 15-20 points  (Franchignoni, F. et al. (2013). Minimally clinically important difference of the disabilities of the arm, shoulder, and hand outcome measures (DASH) and its shortened version (Quick DASH). Journal of Orthopaedic & Sports Physical Therapy, 44(1), 30-39)   COGNITION: Overall cognitive status: Within functional limits for tasks assessed     SENSATION: Not tested  POSTURE: No postural deficits noted   UPPER EXTREMITY ROM:   Active ROM Right eval Left eval Right 07/25/23  Shoulder flexion NT/45 deg 180 110*/NT  Shoulder extension     Shoulder abduction NT/45 deg  90*/NT    Shoulder adduction     Shoulder internal rotation     Shoulder external rotation     Elbow flexion NT/90  NT/30  Elbow extension NT/0  NT/30  Wrist flexion     Wrist extension  Wrist ulnar deviation     Wrist radial deviation     Wrist pronation     Wrist  supination     (Blank rows = not tested)  UPPER EXTREMITY MMT:  MMT Right eval Left eval  Shoulder flexion  5  Shoulder extension    Shoulder abduction  5  Shoulder adduction    Shoulder internal rotation  5  Shoulder external rotation  5                                                                                                  TREATMENT DATE    07/25/23 : All exercises performed RUE   THEREX   Shoulder Flex/Ext AAROM Pulleys 2 x 10 Shoulder Scaption AAROM Pulleys 2 x 10   Shoulder ROM (See Above)  Supine Shoulder Flexion and Extension AAROM with wand  2 x 10  Supine Shoulder ER/IR at 45 deg abduction 2 x 10   Supine Shoulder Punches with flexed elbow 1 x 5  Supine  Shoulder Salutes 2 x 10 Seated Shoulder Salutes  1 x 10     Standing Shoulder Scaption 1 x 10     PATIENT EDUCATION: Education details:  Form and technique for correct performance of exercise and explanation about post op protocol  Person educated: Patient Education method: Explanation, Demonstration, and Verbal cues Education comprehension: verbalized understanding, returned demonstration, and verbal cues required  HOME EXERCISE PROGRAM: Access Code: [email or web address] URL: [email or web address] Date: 07/25/2023 Prepared by: Toribio Servant  Program Notes Seated shoulder salutes 3-4x per week 3 x 10   Exercises - Seated Shoulder Scaption AAROM with Pulley at Side  - 1 x daily - 7 x weekly - 3 sets - 15 reps - Seated Shoulder Flexion AAROM with Pulley Behind  - 1 x daily - 7 x weekly - 3 sets - 15 reps - Seated Cervical Sidebending Stretch  - 1 x daily - 7 x weekly - 3 reps - 30-60 sec  hold - Supine Shoulder Flexion Extension AAROM with Dowel  - 1 x daily - 7 x weekly - 3 sets - 10 reps - Supine Shoulder External Rotation in 45 Degrees Abduction AAROM with Dowel  - 1 x daily - 7 x weekly - 3 sets - 10 reps - Standing Shoulder Scaption  - 3-4 x weekly - 3 sets - 10  reps  ASSESSMENT:  CLINICAL IMPRESSION: Pt continues to progress towards goals with improvement in shoulder ROM and shoulder strength. He was able to complete AROM exercises for the first time without being limited by pain. Pt is well within his rehab timeline with ability to initiate AROM within ROM parameters and without being limited.  He will to benefit from skilled PT to address these aforementioned deficits to regain his right shoulder's prior level of function to return to lifting weights with right shoulder and to typing for his job.   OBJECTIVE IMPAIRMENTS: decreased knowledge of condition, decreased ROM, decreased strength, increased muscle spasms, impaired UE functional use, and pain.   ACTIVITY LIMITATIONS: carrying, lifting, bathing, toileting, dressing, reach over head, and  hygiene/grooming  PARTICIPATION LIMITATIONS: meal prep, cleaning, laundry, driving, shopping, community activity, occupation, and yard work  PERSONAL FACTORS: Age, Education, Fitness, and 1 comorbidity: Gout are also affecting patient's functional outcome.   REHAB POTENTIAL: Excellent  CLINICAL DECISION MAKING: Stable/uncomplicated  EVALUATION COMPLEXITY: Low   GOALS: Goals reviewed with patient? No  SHORT TERM GOALS: Target date: 07/11/2023  Patient will demonstrate undestanding of home exercise plan by performing exercises correctly with evidence of good carry over with min to no verbal or tactile cues .   Baseline: NT 07/11/23: Performing exercises independently  Goal status: ACHIEVED   LONG TERM GOALS: Target date: 09/19/2023  Patient will demonstrate a decrease of >=15 pts on QuickDash for right shoulder as evidence of improved right shoulder function.  Baseline: 84% or 48 pts total  Goal status: ONGOING   2.  Patient will demonstrate right shoulder AROM that is nearly symmetrical (within 10% of L shoulder ROM) to left shoulder for improved right shoulder function to perform reaching and mouse  manipulation tasks for his job.  Baseline: Shoulder Flex PROM R 45, Shoulder Abd R 45, AROM not performed at this point due to protocol Goal status: ONGOING   3.  Patient will demonstrate right shoulder MMT that is symmetrical to left shoulder for improved right shoulder function to perform reaching and mouse manipulation tasks for his job.  Baseline: MMT not performed yet   Goal status: ONGOING   4.  Patient will be able to return to performing upper extremity weight lifting routine with RUE as evidence of improved right shoulder function to maintain physical health.  Baseline: Unable to perform   Goal status: ONGOING   5.  Patient will be able to return to using right shoulder for reaching, typing and manipulating mouse for work related tasks as evidence of improved right shoulder function. Baseline: Can perform some typing but not manipulation of mouse at moment  Goal status: ONGOING    PLAN:  PT FREQUENCY: 1-2x/week  PT DURATION: 12 weeks  PLANNED INTERVENTIONS: 97164- PT Re-evaluation, 97750- Physical Performance Testing, 97110-Therapeutic exercises, 97530- Therapeutic activity, [id number]- Neuromuscular re-education, 97535- Self Care, 02859- Manual therapy, [id number]- Aquatic Therapy, H9716- Electrical stimulation (unattended), [phone]- Electrical stimulation (manual), [id number]- Traction (mechanical), 20560 (1-2 muscles), 20561 (3+ muscles)- Dry Needling, Patient/Family education, Balance training, Taping, Joint mobilization, Joint manipulation, Spinal manipulation, Spinal mobilization, Cryotherapy, and Moist heat  PLAN FOR NEXT SESSION:  Reassess goals: Measure shoulder ROM. Begin intermediate post op phase II: Shoulder AAROM, Shoulder AROM (flex only to 90), Elbow AROM.  Left shoulder MMT and periscapular MMT.   4:57 PM, 07/25/23 Peggye JAYSON Linear, PT, DPT Physical Therapist - [place] [phone] (Office)

## 2023-07-30 ENCOUNTER — Ambulatory Visit: Admitting: Physical Therapy

## 2023-08-01 ENCOUNTER — Ambulatory Visit: Admitting: Physical Therapy

## 2023-08-01 DIAGNOSIS — M25511 Pain in right shoulder: Secondary | ICD-10-CM

## 2023-08-01 DIAGNOSIS — Z9889 Other specified postprocedural states: Secondary | ICD-10-CM

## 2023-08-01 NOTE — Therapy (Signed)
 OUTPATIENT PHYSICAL THERAPY TREATMENT   Patient Name: Lonnie Castro MRN: [id number] DOB:1965-03-24, 58 y.o., male Today's Date: 08/01/2023  END OF SESSION:  PT End of Session - 08/01/23 1439     Visit Number 6    Number of Visits 25    Date for PT Re-Evaluation 09/19/23    Authorization Type [employer] [id number]    Authorization Time Period [employer] COMM Pro    Authorization - Visit Number 6    Authorization - Number of Visits 25    Progress Note Due on Visit 10    PT Start Time 1435    PT Stop Time 1515    PT Time Calculation (min) 40 min    Activity Tolerance Patient tolerated treatment well;No increased pain    Behavior During Therapy [facility] for tasks assessed/performed            Past Medical History:  Diagnosis Date   Allergic rhinitis    Arthritis    Bronchitis 04/04/2021   Gout    Orchitis    Other fatigue 04/04/2021   Past Surgical History:  Procedure Laterality Date   COLONOSCOPY WITH PROPOFOL  N/A 11/03/2014   Procedure: COLONOSCOPY WITH PROPOFOL ;  Surgeon: Gladis RAYMOND Mariner, MD;  Location: [facility] ENDOSCOPY;  Service: Endoscopy;  Laterality: N/A;   COLONOSCOPY WITH PROPOFOL  N/A 05/04/2022   Procedure: COLONOSCOPY WITH PROPOFOL ;  Surgeon: Unk Corinn Skiff, MD;  Location: [facility] ENDOSCOPY;  Service: Gastroenterology;  Laterality: N/A;   FRACTURE SURGERY     HERNIA REPAIR  01/02/1982   NASAL SEPTUM SURGERY  01/02/1985   SHOULDER ARTHROSCOPY WITH ROTATOR CUFF REPAIR Right 06/21/2023   Procedure: ARTHROSCOPY, SHOULDER, WITH ROTATOR CUFF REPAIR;  Surgeon: Cristy Bonner DASEN, MD;  Location: [facility];  Service: Orthopedics;  Laterality: Right;   Patient Active Problem List   Diagnosis Date Noted   Screen for colon cancer 05/04/2022   Tinnitus 06/18/2015   Preventative health care 08/28/2014   Hyperlipidemia 08/28/2014   Eczema of external ear 08/28/2014   Chronic gout 05/25/2014   Rhinitis, allergic 05/25/2014    PCP: Comer Gaskins NP   REFERRING PROVIDER: Dr.  Bonner Cristy   REFERRING DIAG: [phone] (ICD-10-CM) - S/P arthroscopy of right shoulder  THERAPY DIAG:  Acute pain of right shoulder  S/P arthroscopy of right shoulder  Rationale for Evaluation and Treatment: Rehabilitation  ONSET DATE: 06/21/23  SUBJECTIVE:                                                                                                                                                                                      SUBJECTIVE STATEMENT: Pt states ongoing progress with right shoulder to  the point where he has discontinued using sling while sleeping at night.    PERTINENT HISTORY: Pt referred to therapy following right shoulder arthroscopy with a/c joint decompression, RTC debridement, biceps tenodesis and SLAP repair. His pain is well controlled and he presents with right arm in sling. He wants to return to lifting at gym and using his RUE for job related tasks like typing and moving mouse around.   PAIN:  Are you having pain?  No real pain at rest today   PRECAUTIONS: Shoulder -No AAROM or AROM of right shoulder until week 5 and/or cleared by surgeon   WEIGHT BEARING RESTRICTIONS:  No weight bearing through RUE    FALLS:  Has patient fallen in last 6 months? No  LIVING ENVIRONMENT: Lives with: lives with their spouse Lives in: House/apartment Stairs: Did not ask  Has following equipment at home: Did not ask   OCCUPATION: Psychologist, sport and exercise    PLOF: Independent  PATIENT GOALS: Wants to return to gym for UE exercise.   NEXT MD VISIT:   OBJECTIVE:  Note: Objective measures were completed at Evaluation unless otherwise noted.  PATIENT SURVEYS:  Quick Dash:   Please rate your ability do the following activities in the last week by selecting the number below the appropriate response.   Activities Rating  Open a tight or new jar.  5 = Unable  Do heavy household chores (e.g., wash walls, floors). 5 = Unable  Carry a shopping bag or briefcase 5 = Unable   Wash your back. 5 = Unable  Use a knife to cut food. 5 = Unable  Recreational activities in which you take some force or impact through your arm, shoulder or hand (e.g., golf, hammering, tennis, etc.). 5 = Unable  During the past week, to what extent has your arm, shoulder or hand problem interfered with your normal social activities with family, friends, neighbors or groups?  5 = Extremely  During the past week, were you limited in your work or other regular daily activities as a result of your arm, shoulder or hand problem? 5 = Unable  During the past week, were you limited in your work or other regular daily activities as a result of your arm, shoulder or hand problem? 4 = Severe  Tingling (pins and needles) in your arm, shoulder or hand. 2 = Mild  During the past week, how much difficulty have you had sleeping because of the pain in your arm, shoulder or hand?  2 = Mild difficulty   (A QuickDASH score may not be calculated if there is greater than 1 missing item.)  Quick Dash Disability/Symptom Score: [(sum of  (48) responses/11 (n)] -1] x 25 = 84%  Minimally Clinically Important Difference (MCID): 15-20 points  (Franchignoni, F. et al. (2013). Minimally clinically important difference of the disabilities of the arm, shoulder, and hand outcome measures (DASH) and its shortened version (Quick DASH). Journal of Orthopaedic & Sports Physical Therapy, 44(1), 30-39)   COGNITION: Overall cognitive status: Within functional limits for tasks assessed     SENSATION: Not tested  POSTURE: No postural deficits noted   UPPER EXTREMITY ROM:   Active ROM Right eval Left eval Right 07/25/23 Right  08/01/23  Shoulder flexion NT/45 deg 180 110*/NT 90/135   Shoulder extension      Shoulder abduction NT/45 deg  90*/NT   50/90  Shoulder adduction      Shoulder ER at 60 deg Abd     20/50  Shoulder IR  at 60 deg Abd      70/70  Shoulder internal rotation      Shoulder external rotation       Elbow flexion NT/90  NT/30   Elbow extension NT/0  NT/30   Wrist flexion      Wrist extension      Wrist ulnar deviation      Wrist radial deviation      Wrist pronation      Wrist supination      (Blank rows = not tested)  UPPER EXTREMITY MMT:  MMT Right eval Left eval  Shoulder flexion 3-* 5  Shoulder extension    Shoulder abduction 3-* 5  Shoulder adduction    Shoulder internal rotation 3 5  Shoulder external rotation 3-* 5                                                                                                  TREATMENT DATE   08/01/23:  All exercises performed RUE   THEREX UBE seat at 9 - 2.5 min forward and 2.5 backward    Shoulder ROM (See Above)  Shoulder MMT (See Above) Seated Shoulder Flexion with bent elbow  1 x 10   Supine Shoulder Flexion with bent elbow 1 X 10   Shoulder Flexion Wall Walks 1 x 10   Shoulder Abduction/Adduction AAROM Pulleys 2 x 10  QuickDash:  61/100 or 61%  Shoulder Abduction in scaption plane wall walks 2 x 10      PATIENT EDUCATION: Education details:  Form and technique for correct performance of exercise and explanation about post op protocol  Person educated: Patient Education method: Explanation, Demonstration, and Verbal cues Education comprehension: verbalized understanding, returned demonstration, and verbal cues required  HOME EXERCISE PROGRAM: Access Code: [email or web address] URL: [email or web address] Date: 08/01/2023 Prepared by: Toribio Servant  Program Notes Laying down punch up with right arm 2-3 sets 10 reps 3-4 x per week (challenge hold arm up for 10 sec)  Exercises - Seated Shoulder Flexion AAROM with Pulley Behind  - 1 x daily - 7 x weekly - 3 sets - 10 reps - Seated Shoulder Abduction AAROM with Pulley Behind  - 1 x daily - 7 x weekly - 3 sets - 10 reps - Standing Shoulder Flexion Wall Walk  - 1 x daily - 7 x weekly - 2 sets - 10 reps - Standing Shoulder Abduction Wall Slide   - 1 x daily - 7 x  weekly - 2 sets - 10 reps - Supine Shoulder Flexion Extension Full Range AROM  - 3-4 x weekly - 3 sets - 10 reps - Seated Shoulder Press Ups Off Table  - 2-3 x weekly - 2-3 sets - 10 reps - 3-5 sec hold - Standing Isometric Shoulder Extension at Table  - 2-3 x weekly - 2 sets - 10 reps - 3 sec hold - Isometric Shoulder Abduction - Arm Straight at Wall  - 2-3 x weekly - 2 sets - 10 reps - 3 sec hold - Standing Single Arm Bicep Curls Supinated with Dumbbell  - 2-3 x weekly -  3 sets - 10 reps - Supine Chest Stretch on Foam Roll  - 1 x daily - 7 x weekly - 2-3 reps - 60 sec hold - Supine Shoulder External Rotation in 45 Degrees Abduction AAROM with Dowel  - 1 x daily - 7 x weekly - 2 sets - 10 reps  ASSESSMENT:  CLINICAL IMPRESSION: Pt is s/p 6 weeks for right shoulder arthroscopy with RTC debridment, AC decompression, SLAP repair, and biceps tenodesis. He is progressing towards rehab goals with improved perception of right shoulder function, ROM, and strength. He continues to experience right shoulder pain and weakness but this is well within rehab protocol for the procedures he had performed. He is now in late phase of post op protocol or the initial strengthening phase with biceps tenodesis being the most restrictive protocol. He will continue to benefit from skilled PT to address these aforementioned deficits to regain his right shoulder's prior level of function to return to lifting weights with right shoulder and to typing for his job.   OBJECTIVE IMPAIRMENTS: decreased knowledge of condition, decreased ROM, decreased strength, increased muscle spasms, impaired UE functional use, and pain.   ACTIVITY LIMITATIONS: carrying, lifting, bathing, toileting, dressing, reach over head, and hygiene/grooming  PARTICIPATION LIMITATIONS: meal prep, cleaning, laundry, driving, shopping, community activity, occupation, and yard work  PERSONAL FACTORS: Age, Education, Fitness, and 1 comorbidity: Gout are  also affecting patient's functional outcome.   REHAB POTENTIAL: Excellent  CLINICAL DECISION MAKING: Stable/uncomplicated  EVALUATION COMPLEXITY: Low   GOALS: Goals reviewed with patient? No  SHORT TERM GOALS: Target date: 07/11/2023  Patient will demonstrate undestanding of home exercise plan by performing exercises correctly with evidence of good carry over with min to no verbal or tactile cues .   Baseline: NT 07/11/23: Performing exercises independently  Goal status: ACHIEVED   LONG TERM GOALS: Target date: 09/19/2023  Patient will demonstrate a decrease of >=15 pts on QuickDash for right shoulder as evidence of improved right shoulder function.  Baseline: 84% or 48 pts total  08/01/23: 61% Goal status: ACHIEVED    2.  Patient will demonstrate right shoulder AROM that is nearly symmetrical (within 10% of L shoulder ROM) to left shoulder for improved right shoulder function to perform reaching and mouse manipulation tasks for his job.  Baseline: Shoulder Flex PROM R 45, Shoulder Abd R 45, AROM not performed at this point due to protocol  08/01/23: Right Shoulder Flex 90, Abd 50, IR 70, ER 20  Goal status: ONGOING   3.  Patient will demonstrate right shoulder MMT that is symmetrical to left shoulder for improved right shoulder function to perform reaching and mouse manipulation tasks for his job.  Baseline: MMT not performed yet  08/01/23: Shoulder Flex R 3-, Abd 3-, IR 3, ER 3-   Goal status: ONGOING   4.  Patient will be able to return to performing upper extremity weight lifting routine with RUE as evidence of improved right shoulder function to maintain physical health.  Baseline: Unable to perform   Goal status: ONGOING   5.  Patient will be able to return to using right shoulder for reaching, typing and manipulating mouse for work related tasks as evidence of improved right shoulder function. Baseline: Can perform some typing but not manipulation of mouse at moment  Goal status:  ONGOING    PLAN:  PT FREQUENCY: 1-2x/week  PT DURATION: 12 weeks  PLANNED INTERVENTIONS: 97164- PT Re-evaluation, 97750- Physical Performance Testing, 97110-Therapeutic exercises, 97530- Therapeutic activity, 97112-  Neuromuscular re-education, [phone]- Self Care, 02859- Manual therapy, [phone]- Aquatic Therapy, [phone]- Electrical stimulation (unattended), [phone]- Electrical stimulation (manual), [id number]- Traction (mechanical), [phone] (1-2 muscles), [phone] (3+ muscles)- Dry Needling, Patient/Family education, Balance training, Taping, Joint mobilization, Joint manipulation, Spinal manipulation, Spinal mobilization, Cryotherapy, and Moist heat  PLAN FOR NEXT SESSION:  Left shoulder MMT and periscapular MMT. OMEGA Seated Rows and shoulder abduction AROM in supine. Scare the dog and shoulder ER/IR AAROM in sitting   Toribio Servant PT, DPT  [facility] [address] [place], [address] Phone: [phone]   Fax:  [phone]

## 2023-08-02 DIAGNOSIS — M24111 Other articular cartilage disorders, right shoulder: Secondary | ICD-10-CM | POA: Diagnosis not present

## 2023-08-06 ENCOUNTER — Ambulatory Visit: Admitting: Physical Therapy

## 2023-08-06 DIAGNOSIS — Z1331 Encounter for screening for depression: Secondary | ICD-10-CM | POA: Diagnosis not present

## 2023-08-06 DIAGNOSIS — Z79899 Other long term (current) drug therapy: Secondary | ICD-10-CM | POA: Diagnosis not present

## 2023-08-06 DIAGNOSIS — E7800 Pure hypercholesterolemia, unspecified: Secondary | ICD-10-CM | POA: Diagnosis not present

## 2023-08-06 DIAGNOSIS — Z0000 Encounter for general adult medical examination without abnormal findings: Secondary | ICD-10-CM | POA: Diagnosis not present

## 2023-08-08 ENCOUNTER — Ambulatory Visit: Attending: Orthopaedic Surgery | Admitting: Physical Therapy

## 2023-08-08 DIAGNOSIS — M25511 Pain in right shoulder: Secondary | ICD-10-CM | POA: Insufficient documentation

## 2023-08-08 DIAGNOSIS — Z9889 Other specified postprocedural states: Secondary | ICD-10-CM | POA: Insufficient documentation

## 2023-08-08 NOTE — Therapy (Addendum)
 OUTPATIENT PHYSICAL THERAPY TREATMENT   Patient Name: Nameer Summer MRN: [id number] DOB:1965-03-18, 58 y.o., male Today's Date: 08/08/2023  END OF SESSION:   08/08/23 1431  PT Visits / Re-Eval  Visit Number 7  Number of Visits 25  Date for PT Re-Evaluation 09/19/23  Authorization  Authorization Type [id number]  Authorization Time Period 12 approved 6/25-9/22  Authorization - Visit Number 1  Authorization - Number of Visits 7  Progress Note Due on Visit 12  PT Time Calculation  PT Start Time 1430  PT Stop Time 1515  PT Time Calculation (min) 45 min  PT - End of Session  Activity Tolerance Patient tolerated treatment well;No increased pain  Behavior During Therapy [facility] for tasks assessed/performed     Past Medical History:  Diagnosis Date   Allergic rhinitis    Arthritis    Bronchitis 04/04/2021   Gout    Orchitis    Other fatigue 04/04/2021   Past Surgical History:  Procedure Laterality Date   COLONOSCOPY WITH PROPOFOL  N/A 11/03/2014   Procedure: COLONOSCOPY WITH PROPOFOL ;  Surgeon: Gladis RAYMOND Mariner, MD;  Location: [facility] ENDOSCOPY;  Service: Endoscopy;  Laterality: N/A;   COLONOSCOPY WITH PROPOFOL  N/A 05/04/2022   Procedure: COLONOSCOPY WITH PROPOFOL ;  Surgeon: Unk Corinn Skiff, MD;  Location: [facility] ENDOSCOPY;  Service: Gastroenterology;  Laterality: N/A;   FRACTURE SURGERY     HERNIA REPAIR  01/02/1982   NASAL SEPTUM SURGERY  01/02/1985   SHOULDER ARTHROSCOPY WITH ROTATOR CUFF REPAIR Right 06/21/2023   Procedure: ARTHROSCOPY, SHOULDER, WITH ROTATOR CUFF REPAIR;  Surgeon: Cristy Bonner DASEN, MD;  Location: [facility];  Service: Orthopedics;  Laterality: Right;   Patient Active Problem List   Diagnosis Date Noted   Screen for colon cancer 05/04/2022   Tinnitus 06/18/2015   Preventative health care 08/28/2014   Hyperlipidemia 08/28/2014   Eczema of external ear 08/28/2014   Chronic gout 05/25/2014   Rhinitis, allergic 05/25/2014    PCP: Comer Gaskins  NP   REFERRING PROVIDER: Dr. Bonner Cristy   REFERRING DIAG: [phone] (ICD-10-CM) - S/P arthroscopy of right shoulder  THERAPY DIAG:  Acute pain of right shoulder  S/P arthroscopy of right shoulder  Rationale for Evaluation and Treatment: Rehabilitation  ONSET DATE: 06/21/23  SUBJECTIVE:                                                                                                                                                                                      SUBJECTIVE STATEMENT: Pt states ongoing progress with right shoulder to the point where he has discontinued using sling while sleeping at night.    PERTINENT HISTORY: Pt referred to therapy  following right shoulder arthroscopy with a/c joint decompression, RTC debridement, biceps tenodesis and SLAP repair. His pain is well controlled and he presents with right arm in sling. He wants to return to lifting at gym and using his RUE for job related tasks like typing and moving mouse around.   PAIN:  Are you having pain?  No real pain at rest today   PRECAUTIONS: Shoulder -No AAROM or AROM of right shoulder until week 5 and/or cleared by surgeon   WEIGHT BEARING RESTRICTIONS:  No weight bearing through RUE    FALLS:  Has patient fallen in last 6 months? No  LIVING ENVIRONMENT: Lives with: lives with their spouse Lives in: House/apartment Stairs: Did not ask  Has following equipment at home: Did not ask   OCCUPATION: Psychologist, sport and exercise    PLOF: Independent  PATIENT GOALS: Wants to return to gym for UE exercise.   NEXT MD VISIT:   OBJECTIVE:  Note: Objective measures were completed at Evaluation unless otherwise noted.  PATIENT SURVEYS:  Quick Dash:   Please rate your ability do the following activities in the last week by selecting the number below the appropriate response.   Activities Rating  Open a tight or new jar.  5 = Unable  Do heavy household chores (e.g., wash walls, floors). 5 = Unable  Carry a  shopping bag or briefcase 5 = Unable  Wash your back. 5 = Unable  Use a knife to cut food. 5 = Unable  Recreational activities in which you take some force or impact through your arm, shoulder or hand (e.g., golf, hammering, tennis, etc.). 5 = Unable  During the past week, to what extent has your arm, shoulder or hand problem interfered with your normal social activities with family, friends, neighbors or groups?  5 = Extremely  During the past week, were you limited in your work or other regular daily activities as a result of your arm, shoulder or hand problem? 5 = Unable  During the past week, were you limited in your work or other regular daily activities as a result of your arm, shoulder or hand problem? 4 = Severe  Tingling (pins and needles) in your arm, shoulder or hand. 2 = Mild  During the past week, how much difficulty have you had sleeping because of the pain in your arm, shoulder or hand?  2 = Mild difficulty   (A QuickDASH score may not be calculated if there is greater than 1 missing item.)  Quick Dash Disability/Symptom Score: [(sum of  (48) responses/11 (n)] -1] x 25 = 84%  Minimally Clinically Important Difference (MCID): 15-20 points  (Franchignoni, F. et al. (2013). Minimally clinically important difference of the disabilities of the arm, shoulder, and hand outcome measures (DASH) and its shortened version (Quick DASH). Journal of Orthopaedic & Sports Physical Therapy, 44(1), 30-39)   COGNITION: Overall cognitive status: Within functional limits for tasks assessed     SENSATION: Not tested  POSTURE: No postural deficits noted   UPPER EXTREMITY ROM:   Active/Passive ROM Right eval Left eval Right 07/25/23 Right  08/01/23 Right   08/08/23  Shoulder flexion NT/45 deg 180 110*/NT 90/135  115/ NT   Shoulder extension       Shoulder abduction NT/45 deg  90*/NT   50/90 65*  Shoulder adduction       Shoulder ER at 60 deg Abd     20/50 30/NT  Shoulder IR at 60 deg Abd  70/70 30/NT   Shoulder internal rotation       Shoulder external rotation       Elbow flexion NT/90  NT/30    Elbow extension NT/0  NT/30    Wrist flexion       Wrist extension       Wrist ulnar deviation       Wrist radial deviation       Wrist pronation       Wrist supination       (Blank rows = not tested)  UPPER EXTREMITY MMT:  MMT Right eval Left eval Right  08/08/23 Left  08/08/23  Shoulder flexion 3-* 5 4- 4  Shoulder extension      Shoulder abduction 3-* 5 3+ 4  Shoulder adduction      Shoulder internal rotation 3 5 4- 4  Shoulder external rotation 3-* 5 4- 4                                                                                                  TREATMENT DATE   08/08/23: THEREX: All single UE on Right   Shoulder Flex/Ext AAROM Pulleys 2 x 10  Shoulder Abd/Add AAROM Pulleys 2 x 10  OMEGA Seated Rows #20 1 x 10 -min VC to only extend elbow rib cage    OMEGA Seated Rows #25 2 x 10   Shoulder ROM and MMT (See Above)  Shoulder ER/IR AAROM at 70 deg abduction 2 x 10  -min VC to maintain elbow flexion at 90 degrees    NMR Bicep Curl #1 DB X 20  Bicep Curl #2 DB x 20   Bicep Curl #3 DB x 20   Elbow extension with #5 1 X 10    SELF CARE HOME MANAGEMENT  Sets and reps and frequency for neuromuscular re-education versus hypertrophy training. Education on movements eliciting pain and how this relates to healing tissues especially with labrum repair.      PATIENT EDUCATION: Education details:  Form and technique for correct performance of exercise and explanation about post op protocol  Person educated: Patient Education method: Explanation, Demonstration, and Verbal cues Education comprehension: verbalized understanding, returned demonstration, and verbal cues required  HOME EXERCISE PROGRAM: Access Code: [email or web address] URL: [email or web address] Date: 08/01/2023 Prepared by: Toribio Servant  Program Notes Laying down punch up with right  arm 2-3 sets 10 reps 3-4 x per week (challenge hold arm up for 10 sec)  Exercises - Seated Shoulder Flexion AAROM with Pulley Behind  - 1 x daily - 7 x weekly - 3 sets - 10 reps - Seated Shoulder Abduction AAROM with Pulley Behind  - 1 x daily - 7 x weekly - 3 sets - 10 reps - Standing Shoulder Flexion Wall Walk  - 1 x daily - 7 x weekly - 2 sets - 10 reps - Standing Shoulder Abduction Wall Slide   - 1 x daily - 7 x weekly - 2 sets - 10 reps - Supine Shoulder Flexion Extension Full Range AROM  - 3-4 x weekly - 3 sets - 10 reps - Seated  Shoulder Press Ups Off Table  - 2-3 x weekly - 2-3 sets - 10 reps - 3-5 sec hold - Standing Isometric Shoulder Extension at Table  - 2-3 x weekly - 2 sets - 10 reps - 3 sec hold - Isometric Shoulder Abduction - Arm Straight at Wall  - 2-3 x weekly - 2 sets - 10 reps - 3 sec hold - Standing Single Arm Bicep Curls Supinated with Dumbbell  - 2-3 x weekly - 3 sets - 10 reps - Supine Chest Stretch on Foam Roll  - 1 x daily - 7 x weekly - 2-3 reps - 60 sec hold - Supine Shoulder External Rotation in 45 Degrees Abduction AAROM with Dowel  - 1 x daily - 7 x weekly - 2 sets - 10 reps  ASSESSMENT:  CLINICAL IMPRESSION: Pt is s/p 7 weeks for right shoulder arthroscopy with RTC debridment, AC decompression, SLAP repair, and biceps tenodesis. He is progressing towards goals with improved right shoulder ROM and strength. He continues to have restrictions with shoulder ER and IR at 90 deg abduction but this to be expected for timeframe for SLAP repair protocol.  He will continue to benefit from skilled PT to address these aforementioned deficits to regain his right shoulder's prior level of function to return to lifting weights with right shoulder and to typing for his job.   OBJECTIVE IMPAIRMENTS: decreased knowledge of condition, decreased ROM, decreased strength, increased muscle spasms, impaired UE functional use, and pain.   ACTIVITY LIMITATIONS: carrying, lifting,  bathing, toileting, dressing, reach over head, and hygiene/grooming  PARTICIPATION LIMITATIONS: meal prep, cleaning, laundry, driving, shopping, community activity, occupation, and yard work  PERSONAL FACTORS: Age, Education, Fitness, and 1 comorbidity: Gout are also affecting patient's functional outcome.   REHAB POTENTIAL: Excellent  CLINICAL DECISION MAKING: Stable/uncomplicated  EVALUATION COMPLEXITY: Low   GOALS: Goals reviewed with patient? No  SHORT TERM GOALS: Target date: 07/11/2023  Patient will demonstrate undestanding of home exercise plan by performing exercises correctly with evidence of good carry over with min to no verbal or tactile cues .   Baseline: NT 07/11/23: Performing exercises independently  Goal status: ACHIEVED   LONG TERM GOALS: Target date: 09/19/2023  Patient will demonstrate a decrease of >=15 pts on QuickDash for right shoulder as evidence of improved right shoulder function.  Baseline: 84% or 48 pts total  08/01/23: 61% Goal status: ACHIEVED    2.  Patient will demonstrate right shoulder AROM that is nearly symmetrical (within 10% of L shoulder ROM) to left shoulder for improved right shoulder function to perform reaching and mouse manipulation tasks for his job.  Baseline: Shoulder Flex PROM R 45, Shoulder Abd R 45, AROM not performed at this point due to protocol  08/01/23: Right Shoulder Flex 90, Abd 50, IR 70, ER 20  Goal status: ONGOING   3.  Patient will demonstrate right shoulder MMT that is symmetrical to left shoulder for improved right shoulder function to perform reaching and mouse manipulation tasks for his job.  Baseline: MMT not performed yet  08/01/23: Shoulder Flex R 3-, Abd 3-, IR 3, ER 3-   Goal status: ONGOING   4.  Patient will be able to return to performing upper extremity weight lifting routine with RUE as evidence of improved right shoulder function to maintain physical health.  Baseline: Unable to perform   Goal status: ONGOING    5.  Patient will be able to return to using right shoulder for reaching, typing and  manipulating mouse for work related tasks as evidence of improved right shoulder function. Baseline: Can perform some typing but not manipulation of mouse at moment  Goal status: ONGOING    PLAN:  PT FREQUENCY: 1-2x/week  PT DURATION: 12 weeks  PLANNED INTERVENTIONS: 97164- PT Re-evaluation, 97750- Physical Performance Testing, 97110-Therapeutic exercises, 97530- Therapeutic activity, 97112- Neuromuscular re-education, 97535- Self Care, [id number]- Manual therapy, [id number]- Aquatic Therapy, H9716- Electrical stimulation (unattended), [id number]- Electrical stimulation (manual), [id number]- Traction (mechanical), 20560 (1-2 muscles), 20561 (3+ muscles)- Dry Needling, Patient/Family education, Balance training, Taping, Joint mobilization, Joint manipulation, Spinal manipulation, Spinal mobilization, Cryotherapy, and Moist heat  PLAN FOR NEXT SESSION:  UBE warm up. Shoulder abduction AROM in supine and shoulder abduction strengthening in supine.  Shoulder ER/IR at 0 deg abduction   Toribio Servant PT, DPT  [facility] [address] [place], [address] Phone: [phone]   Fax:  [phone]

## 2023-08-13 ENCOUNTER — Ambulatory Visit: Admitting: Physical Therapy

## 2023-08-13 DIAGNOSIS — Z9889 Other specified postprocedural states: Secondary | ICD-10-CM

## 2023-08-13 DIAGNOSIS — M25511 Pain in right shoulder: Secondary | ICD-10-CM | POA: Diagnosis not present

## 2023-08-13 NOTE — Therapy (Addendum)
 OUTPATIENT PHYSICAL THERAPY TREATMENT   Patient Name: Lonnie Castro MRN: [id number] DOB:1965-03-10, 58 y.o., male Today's Date: 08/13/2023  END OF SESSION:   08/13/23 1043  PT Visits / Re-Eval  Visit Number 8  Number of Visits 25  Date for PT Re-Evaluation 09/19/23  Authorization  Authorization Type [id number]  Authorization Time Period 12 approved 6/25-9/22  Authorization - Visit Number 8  Authorization - Number of Visits 12  Progress Note Due on Visit 12  PT Time Calculation  PT Start Time 1035  PT Stop Time 1115  PT Time Calculation (min) 40 min  PT - End of Session  Activity Tolerance Patient tolerated treatment well;No increased pain  Behavior During Therapy [facility] for tasks assessed/performed      Past Medical History:  Diagnosis Date   Allergic rhinitis    Arthritis    Bronchitis 04/04/2021   Gout    Orchitis    Other fatigue 04/04/2021   Past Surgical History:  Procedure Laterality Date   COLONOSCOPY WITH PROPOFOL  N/A 11/03/2014   Procedure: COLONOSCOPY WITH PROPOFOL ;  Surgeon: Gladis RAYMOND Mariner, MD;  Location: [facility] ENDOSCOPY;  Service: Endoscopy;  Laterality: N/A;   COLONOSCOPY WITH PROPOFOL  N/A 05/04/2022   Procedure: COLONOSCOPY WITH PROPOFOL ;  Surgeon: Unk Corinn Skiff, MD;  Location: [facility] ENDOSCOPY;  Service: Gastroenterology;  Laterality: N/A;   FRACTURE SURGERY     HERNIA REPAIR  01/02/1982   NASAL SEPTUM SURGERY  01/02/1985   SHOULDER ARTHROSCOPY WITH ROTATOR CUFF REPAIR Right 06/21/2023   Procedure: ARTHROSCOPY, SHOULDER, WITH ROTATOR CUFF REPAIR;  Surgeon: Cristy Bonner DASEN, MD;  Location: [facility];  Service: Orthopedics;  Laterality: Right;   Patient Active Problem List   Diagnosis Date Noted   Screen for colon cancer 05/04/2022   Tinnitus 06/18/2015   Preventative health care 08/28/2014   Hyperlipidemia 08/28/2014   Eczema of external ear 08/28/2014   Chronic gout 05/25/2014   Rhinitis, allergic 05/25/2014    PCP: Comer Gaskins NP   REFERRING PROVIDER: Dr. Bonner Cristy   REFERRING DIAG: [phone] (ICD-10-CM) - S/P arthroscopy of right shoulder  THERAPY DIAG:  No diagnosis found.  Rationale for Evaluation and Treatment: Rehabilitation  ONSET DATE: 06/21/23  SUBJECTIVE:                                                                                                                                                                                      SUBJECTIVE STATEMENT: Pt states that he felt increased soreness in his right arm after last session and he is not sure what caused it. He describes pain as sharp and he feels it when he brings it out  into external rotation. He also reports noticing decreased shoulder flexion using measurement on wall.   PERTINENT HISTORY: Pt referred to therapy following right shoulder arthroscopy with a/c joint decompression, RTC debridement, biceps tenodesis and SLAP repair. His pain is well controlled and he presents with right arm in sling. He wants to return to lifting at gym and using his RUE for job related tasks like typing and moving mouse around.   PAIN:  Are you having pain?  No real pain at rest today   PRECAUTIONS: Shoulder -No AAROM or AROM of right shoulder until week 5 and/or cleared by surgeon   WEIGHT BEARING RESTRICTIONS:  No weight bearing through RUE    FALLS:  Has patient fallen in last 6 months? No  LIVING ENVIRONMENT: Lives with: lives with their spouse Lives in: House/apartment Stairs: Did not ask  Has following equipment at home: Did not ask   OCCUPATION: Psychologist, sport and exercise    PLOF: Independent  PATIENT GOALS: Wants to return to gym for UE exercise.   NEXT MD VISIT:   OBJECTIVE:  Note: Objective measures were completed at Evaluation unless otherwise noted.  PATIENT SURVEYS:  Quick Dash:   Please rate your ability do the following activities in the last week by selecting the number below the appropriate response.   Activities Rating   Open a tight or new jar.  5 = Unable  Do heavy household chores (e.g., wash walls, floors). 5 = Unable  Carry a shopping bag or briefcase 5 = Unable  Wash your back. 5 = Unable  Use a knife to cut food. 5 = Unable  Recreational activities in which you take some force or impact through your arm, shoulder or hand (e.g., golf, hammering, tennis, etc.). 5 = Unable  During the past week, to what extent has your arm, shoulder or hand problem interfered with your normal social activities with family, friends, neighbors or groups?  5 = Extremely  During the past week, were you limited in your work or other regular daily activities as a result of your arm, shoulder or hand problem? 5 = Unable  During the past week, were you limited in your work or other regular daily activities as a result of your arm, shoulder or hand problem? 4 = Severe  Tingling (pins and needles) in your arm, shoulder or hand. 2 = Mild  During the past week, how much difficulty have you had sleeping because of the pain in your arm, shoulder or hand?  2 = Mild difficulty   (A QuickDASH score may not be calculated if there is greater than 1 missing item.)  Quick Dash Disability/Symptom Score: [(sum of  (48) responses/11 (n)] -1] x 25 = 84%  Minimally Clinically Important Difference (MCID): 15-20 points  (Franchignoni, F. et al. (2013). Minimally clinically important difference of the disabilities of the arm, shoulder, and hand outcome measures (DASH) and its shortened version (Quick DASH). Journal of Orthopaedic & Sports Physical Therapy, 44(1), 30-39)   COGNITION: Overall cognitive status: Within functional limits for tasks assessed     SENSATION: Not tested  POSTURE: No postural deficits noted   UPPER EXTREMITY ROM:   Active/Passive ROM Right eval Left eval Right 07/25/23 Right  08/01/23 Right   08/08/23 Right  08/13/23  Shoulder flexion NT/45 deg 180 110*/NT 90/135  115/ NT  117/125  Shoulder extension         Shoulder abduction NT/45 deg  90*/NT   50/90 65* 65/70   Shoulder adduction  Shoulder ER at 60 deg Abd     20/50 30/NT 25/NT    Shoulder IR at 60 deg Abd      70/70 30/NT    Shoulder internal rotation        Shoulder external rotation        Elbow flexion NT/90  NT/30     Elbow extension NT/0  NT/30     Wrist flexion        Wrist extension        Wrist ulnar deviation        Wrist radial deviation        Wrist pronation        Wrist supination        (Blank rows = not tested)  UPPER EXTREMITY MMT:  MMT Right eval Left eval Right  08/08/23 Left  08/08/23  Shoulder flexion 3-* 5 4- 4  Shoulder extension      Shoulder abduction 3-* 5 3+ 4  Shoulder adduction      Shoulder internal rotation 3 5 4- 4  Shoulder external rotation 3-* 5 4- 4                                                                                                  TREATMENT DATE   08/13/23 THEREX: All exercises performed on RUE   UBE seat at 7 - 2.5 min forward and 2.5 backward and resistance at 3    Shoulder ROM (See Above) Supine Shoulder ER/IR AAROM in 60 deg abduction 2 x 10   -Able to reach 35 deg ER    Seated Shoulder ER/IR AAROM in 60 deg abduction 1 x 10  -Pt only able to reach 15 deg ER when he starts to experience increased pain  Shoulder Flexion Wall Walks 2 x 10 Shoulder Flexion Wall Stretch 2 x 30 sec   Shoulder Abduction in Scaption 2 x 10   -min VC to perform at last 3 reps during exercises   Shoulder Abduction AAROM with PVC pipe  1 x 10  Shoulder Abduction AAROM with SPC 1 x 10     PATIENT EDUCATION: Education details:  Form and technique for correct performance of exercise and explanation about post op protocol  Person educated: Patient Education method: Explanation, Demonstration, and Verbal cues Education comprehension: verbalized understanding, returned demonstration, and verbal cues required  HOME EXERCISE PROGRAM: Access Code: [email or web address] URL:  [email or web address] Date: 08/13/2023 Prepared by: Toribio Servant  Exercises - Seated Shoulder Flexion AAROM with Pulley Behind  - 1 x daily - 7 x weekly - 3 sets - 10 reps - Seated Shoulder Abduction AAROM with Pulley Behind  - 1 x daily - 7 x weekly - 3 sets - 10 reps - Standing Shoulder Scaption Wall Walk  - 1 x daily - 7 x weekly - 2 sets - 10 reps - Standing Shoulder Flexion Wall Walk  - 1 x daily - 7 x weekly - 2 sets - 10 reps - Standing Shoulder Abduction Wall Slide   - 1 x daily - 7 x weekly - 2 sets -  10 reps - Supine Shoulder External Internal Rotation AAROM with Dowel  - 1 x daily - 7 x weekly - 2 sets - 10 reps - Standing Shoulder Abduction AAROM with Dowel  - 1 x daily - 7 x weekly - 2 sets - 10 reps - Seated AAROM Shoulder ER at 45 deg with counter support using cane   - 1 x daily - 7 x weekly - 2 sets - 10 reps - Supine Shoulder Flexion Extension Full Range AROM  - 3-4 x weekly - 3 sets - 10 reps - Standing Single Arm Bicep Curls Supinated with Dumbbell  - 5-7 x weekly - 3-5 sets - 30 reps  ASSESSMENT:  CLINICAL IMPRESSION: Pt is s/p 7.5 weeks for right shoulder arthroscopy with RTC debridment, AC decompression, SLAP repair, and biceps tenodesis. He continues to slowly progress with shoulder ROM and strength. He is now consistently performing shoulder AAROM and AROM exercises without pain and discomfort and he demonstrates ability to not push into pain. Pain that he was experiencing after last session likely due to him moving into excessive external rotation, which is a common provocative motion for SLAP repair. He will continue to benefit from skilled PT to address these aforementioned deficits to regain his right shoulder's prior level of function to return to lifting weights with right shoulder and to typing for his job.   OBJECTIVE IMPAIRMENTS: decreased knowledge of condition, decreased ROM, decreased strength, increased muscle spasms, impaired UE functional  use, and pain.   ACTIVITY LIMITATIONS: carrying, lifting, bathing, toileting, dressing, reach over head, and hygiene/grooming  PARTICIPATION LIMITATIONS: meal prep, cleaning, laundry, driving, shopping, community activity, occupation, and yard work  PERSONAL FACTORS: Age, Education, Fitness, and 1 comorbidity: Gout are also affecting patient's functional outcome.   REHAB POTENTIAL: Excellent  CLINICAL DECISION MAKING: Stable/uncomplicated  EVALUATION COMPLEXITY: Low   GOALS: Goals reviewed with patient? No  SHORT TERM GOALS: Target date: 07/11/2023  Patient will demonstrate undestanding of home exercise plan by performing exercises correctly with evidence of good carry over with min to no verbal or tactile cues .   Baseline: NT 07/11/23: Performing exercises independently  Goal status: ACHIEVED   LONG TERM GOALS: Target date: 09/19/2023  Patient will demonstrate a decrease of >=15 pts on QuickDash for right shoulder as evidence of improved right shoulder function.  Baseline: 84% or 48 pts total  08/01/23: 61% Goal status: ACHIEVED    2.  Patient will demonstrate right shoulder AROM that is nearly symmetrical (within 10% of L shoulder ROM) to left shoulder for improved right shoulder function to perform reaching and mouse manipulation tasks for his job.  Baseline: Shoulder Flex PROM R 45, Shoulder Abd R 45, AROM not performed at this point due to protocol  08/01/23: Right Shoulder Flex 90, Abd 50, IR 70, ER 20  Goal status: ONGOING   3.  Patient will demonstrate right shoulder MMT that is symmetrical to left shoulder for improved right shoulder function to perform reaching and mouse manipulation tasks for his job.  Baseline: MMT not performed yet  08/01/23: Shoulder Flex R 3-, Abd 3-, IR 3, ER 3-   Goal status: ONGOING   4.  Patient will be able to return to performing upper extremity weight lifting routine with RUE as evidence of improved right shoulder function to maintain physical  health.  Baseline: Unable to perform   Goal status: ONGOING   5.  Patient will be able to return to using right shoulder for reaching, typing  and manipulating mouse for work related tasks as evidence of improved right shoulder function. Baseline: Can perform some typing but not manipulation of mouse at moment  Goal status: ONGOING    PLAN:  PT FREQUENCY: 1-2x/week  PT DURATION: 12 weeks  PLANNED INTERVENTIONS: 97164- PT Re-evaluation, 97750- Physical Performance Testing, 97110-Therapeutic exercises, 97530- Therapeutic activity, [id number]- Neuromuscular re-education, 97535- Self Care, [id number]- Manual therapy, [id number]- Aquatic Therapy, H9716- Electrical stimulation (unattended), [phone]- Electrical stimulation (manual), [id number]- Traction (mechanical), 20560 (1-2 muscles), 20561 (3+ muscles)- Dry Needling, Patient/Family education, Balance training, Taping, Joint mobilization, Joint manipulation, Spinal manipulation, Spinal mobilization, Cryotherapy, and Moist heat  PLAN FOR NEXT SESSION:  Progress note and reassess goals. Revisit periscapular strengthening including: side lying shoulder ER/IR. Single UE rows and pull downs. Wall push ups. Resisted shoulder ER/IR at 45 or 60 deg abduciton.    Toribio Servant PT, DPT  [facility] [address] [place], [address] Phone: [phone]   Fax:  [phone]

## 2023-08-16 ENCOUNTER — Encounter: Admitting: Physical Therapy

## 2023-08-20 ENCOUNTER — Ambulatory Visit: Admitting: Physical Therapy

## 2023-08-20 DIAGNOSIS — Z85820 Personal history of malignant melanoma of skin: Secondary | ICD-10-CM | POA: Diagnosis not present

## 2023-08-20 DIAGNOSIS — Z9889 Other specified postprocedural states: Secondary | ICD-10-CM

## 2023-08-20 DIAGNOSIS — Z86018 Personal history of other benign neoplasm: Secondary | ICD-10-CM | POA: Diagnosis not present

## 2023-08-20 DIAGNOSIS — L578 Other skin changes due to chronic exposure to nonionizing radiation: Secondary | ICD-10-CM | POA: Diagnosis not present

## 2023-08-20 DIAGNOSIS — M25511 Pain in right shoulder: Secondary | ICD-10-CM | POA: Diagnosis not present

## 2023-08-20 DIAGNOSIS — B078 Other viral warts: Secondary | ICD-10-CM | POA: Diagnosis not present

## 2023-08-20 NOTE — Therapy (Addendum)
 OUTPATIENT PHYSICAL THERAPY PROGRESS  Dates of Reporting: 06/27/23-08/20/23    Patient Name: Lonnie Castro MRN: [id number] DOB:23-Nov-1965, 58 y.o., male Today's Date: 08/20/2023  END OF SESSION:   08/20/23 1118  PT Visits / Re-Eval  Visit Number 9  Number of Visits 25  Date for PT Re-Evaluation 09/19/23  Authorization  Authorization Type [id number]  Authorization Time Period 12 approved 6/25-9/22  Authorization - Visit Number 9  Authorization - Number of Visits 12  Progress Note Due on Visit 12  PT Time Calculation  PT Start Time 1115  PT Stop Time 1145  PT Time Calculation (min) 30 min  PT - End of Session  Activity Tolerance Patient tolerated treatment well;No increased pain  Behavior During Therapy [facility] for tasks assessed/performed      Past Medical History:  Diagnosis Date   Allergic rhinitis    Arthritis    Bronchitis 04/04/2021   Gout    Orchitis    Other fatigue 04/04/2021   Past Surgical History:  Procedure Laterality Date   COLONOSCOPY WITH PROPOFOL  N/A 11/03/2014   Procedure: COLONOSCOPY WITH PROPOFOL ;  Surgeon: Gladis RAYMOND Mariner, MD;  Location: [facility] ENDOSCOPY;  Service: Endoscopy;  Laterality: N/A;   COLONOSCOPY WITH PROPOFOL  N/A 05/04/2022   Procedure: COLONOSCOPY WITH PROPOFOL ;  Surgeon: Unk Corinn Skiff, MD;  Location: [facility] ENDOSCOPY;  Service: Gastroenterology;  Laterality: N/A;   FRACTURE SURGERY     HERNIA REPAIR  01/02/1982   NASAL SEPTUM SURGERY  01/02/1985   SHOULDER ARTHROSCOPY WITH ROTATOR CUFF REPAIR Right 06/21/2023   Procedure: ARTHROSCOPY, SHOULDER, WITH ROTATOR CUFF REPAIR;  Surgeon: Cristy Bonner DASEN, MD;  Location: [facility];  Service: Orthopedics;  Laterality: Right;   Patient Active Problem List   Diagnosis Date Noted   Screen for colon cancer 05/04/2022   Tinnitus 06/18/2015   Preventative health care 08/28/2014   Hyperlipidemia 08/28/2014   Eczema of external ear 08/28/2014   Chronic gout 05/25/2014   Rhinitis,  allergic 05/25/2014    PCP: Comer Gaskins NP   REFERRING PROVIDER: Dr. Bonner Cristy   REFERRING DIAG: [phone] (ICD-10-CM) - S/P arthroscopy of right shoulder  THERAPY DIAG:  Acute pain of right shoulder  S/P arthroscopy of right shoulder  Rationale for Evaluation and Treatment: Rehabilitation  ONSET DATE: 06/21/23  SUBJECTIVE:                                                                                                                                                                                      SUBJECTIVE STATEMENT: Pt states that he felt increased soreness in his right arm after last session and he is not sure what caused it.  He describes pain as sharp and he feels it when he brings it out into external rotation. He also reports noticing decreased shoulder flexion using measurement on wall.   PERTINENT HISTORY: Pt referred to therapy following right shoulder arthroscopy with a/c joint decompression, RTC debridement, biceps tenodesis and SLAP repair. His pain is well controlled and he presents with right arm in sling. He wants to return to lifting at gym and using his RUE for job related tasks like typing and moving mouse around.   PAIN:  Are you having pain?  No real pain at rest today   PRECAUTIONS: Shoulder -No AAROM or AROM of right shoulder until week 5 and/or cleared by surgeon   WEIGHT BEARING RESTRICTIONS:  No weight bearing through RUE    FALLS:  Has patient fallen in last 6 months? No  LIVING ENVIRONMENT: Lives with: lives with their spouse Lives in: House/apartment Stairs: Did not ask  Has following equipment at home: Did not ask   OCCUPATION: Psychologist, sport and exercise    PLOF: Independent  PATIENT GOALS: Wants to return to gym for UE exercise.   NEXT MD VISIT:   OBJECTIVE:  Note: Objective measures were completed at Evaluation unless otherwise noted.  PATIENT SURVEYS:  Quick Dash:   Please rate your ability do the following activities in the last  week by selecting the number below the appropriate response.   Activities Rating  Open a tight or new jar.  5 = Unable  Do heavy household chores (e.g., wash walls, floors). 5 = Unable  Carry a shopping bag or briefcase 5 = Unable  Wash your back. 5 = Unable  Use a knife to cut food. 5 = Unable  Recreational activities in which you take some force or impact through your arm, shoulder or hand (e.g., golf, hammering, tennis, etc.). 5 = Unable  During the past week, to what extent has your arm, shoulder or hand problem interfered with your normal social activities with family, friends, neighbors or groups?  5 = Extremely  During the past week, were you limited in your work or other regular daily activities as a result of your arm, shoulder or hand problem? 5 = Unable  During the past week, were you limited in your work or other regular daily activities as a result of your arm, shoulder or hand problem? 4 = Severe  Tingling (pins and needles) in your arm, shoulder or hand. 2 = Mild  During the past week, how much difficulty have you had sleeping because of the pain in your arm, shoulder or hand?  2 = Mild difficulty   (A QuickDASH score may not be calculated if there is greater than 1 missing item.)  Quick Dash Disability/Symptom Score: [(sum of  (48) responses/11 (n)] -1] x 25 = 84%  Minimally Clinically Important Difference (MCID): 15-20 points  (Franchignoni, F. et al. (2013). Minimally clinically important difference of the disabilities of the arm, shoulder, and hand outcome measures (DASH) and its shortened version (Quick DASH). Journal of Orthopaedic & Sports Physical Therapy, 44(1), 30-39)   COGNITION: Overall cognitive status: Within functional limits for tasks assessed     SENSATION: Not tested  POSTURE: No postural deficits noted   UPPER EXTREMITY ROM:   Active/Passive ROM Right eval Left eval Right 07/25/23 Right  08/01/23 Right   08/08/23 Right  08/13/23 Right  08/20/23  Left  08/20/23  Shoulder flexion NT/45 deg 180 110*/NT 90/135  115/ NT  117/125 130/NT 180  Shoulder extension  Shoulder abduction NT/45 deg  90*/NT   50/90 65* 65/70  70/NT  170  Shoulder adduction       30/NT 70  Shoulder ER at 90 deg Abd        70/NT 90  Shoulder ER at 60 deg Abd     20/50 30/NT 25/NT      Shoulder IR at 60 deg Abd      70/70 30/NT      Shoulder internal rotation          Shoulder external rotation          Elbow flexion NT/90  NT/30       Elbow extension NT/0  NT/30       Wrist flexion          Wrist extension          Wrist ulnar deviation          Wrist radial deviation          Wrist pronation          Wrist supination          (Blank rows = not tested)                                  (Blank rows = not tested)        UPPER EXTREMITY MMT:  MMT Right eval Left eval Right  08/08/23 Left  08/08/23 Right  08/20/23  Shoulder flexion 3-* 5 4- 4 4-  Shoulder extension       Shoulder abduction 3-* 5 3+ 4 4-  Shoulder adduction       Shoulder internal rotation 3 5 4- 4 4-  Shoulder external rotation 3-* 5 4- 4 4-                                                                                                  TREATMENT DATE   08/20/23 THEREX    Serratus wall slides 1 x 10   Serratus wall slides with pillow case 1 x 10   Shoulder AROM (See Above)  Shoulder MMT (See Above) Shoulder Abduction Wall Walks  1 X 10   Supine Shoulder Flexion AROM with #2 DB 3 x 10    [facility] Education to continue icing shoulder until at least 12 weeks post op to continue to reduce inflammation.   Education to maintain decreased shoulder load to <=3 lbs for UE exercises    08/13/23 THEREX: All exercises performed on RUE   UBE seat at 7 - 2.5 min forward and 2.5 backward and resistance at 3    Shoulder ROM (See Above) Supine Shoulder ER/IR AAROM in 60 deg abduction 2 x 10   -Able to reach 35 deg ER    Seated Shoulder ER/IR AAROM in 60 deg  abduction 1 x 10  -Pt only able to reach 15 deg ER when he starts to experience increased pain  Shoulder Flexion Wall Walks 2 x 10 Shoulder Flexion Wall Stretch 2  x 30 sec   Shoulder Abduction in Scaption 2 x 10   -min VC to perform at last 3 reps during exercises   Shoulder Abduction AAROM with PVC pipe  1 x 10  Shoulder Abduction AAROM with SPC 1 x 10     PATIENT EDUCATION: Education details:  Form and technique for correct performance of exercise and explanation about post op protocol  Person educated: Patient Education method: Explanation, Demonstration, and Verbal cues Education comprehension: verbalized understanding, returned demonstration, and verbal cues required  HOME EXERCISE PROGRAM: Access Code: [email or web address] URL: [email or web address] Date: 08/13/2023 Prepared by: Toribio Servant  Exercises - Seated Shoulder Flexion AAROM with Pulley Behind  - 1 x daily - 7 x weekly - 3 sets - 10 reps - Seated Shoulder Abduction AAROM with Pulley Behind  - 1 x daily - 7 x weekly - 3 sets - 10 reps - Standing Shoulder Scaption Wall Walk  - 1 x daily - 7 x weekly - 2 sets - 10 reps - Standing Shoulder Flexion Wall Walk  - 1 x daily - 7 x weekly - 2 sets - 10 reps - Standing Shoulder Abduction Wall Slide   - 1 x daily - 7 x weekly - 2 sets - 10 reps - Supine Shoulder External Internal Rotation AAROM with Dowel  - 1 x daily - 7 x weekly - 2 sets - 10 reps - Standing Shoulder Abduction AAROM with Dowel  - 1 x daily - 7 x weekly - 2 sets - 10 reps - Seated AAROM Shoulder ER at 45 deg with counter support using cane   - 1 x daily - 7 x weekly - 2 sets - 10 reps - Supine Shoulder Flexion Extension Full Range AROM  - 3-4 x weekly - 3 sets - 10 reps - Standing Single Arm Bicep Curls Supinated with Dumbbell  - 5-7 x weekly - 3-5 sets - 30 reps  ASSESSMENT:  CLINICAL IMPRESSION: Pt is s/p 8.5 weeks for right shoulder arthroscopy with RTC debridment, AC decompression, SLAP repair, and  biceps tenodesis. Pt continues to progress towards goals with improvement in right shoulder mobility and strength. He still shows ongoing right shoulder mobility and strength deficits but these ae expected within rehab protocol timelines. PT to check on weight limits with referring orthopedist to verify what are weight lifting limits. He will continue to benefit from skilled PT to address these aforementioned deficits to regain his right shoulder's prior level of function to return to lifting weights with right shoulder and to typing for his job.    OBJECTIVE IMPAIRMENTS: decreased knowledge of condition, decreased ROM, decreased strength, increased muscle spasms, impaired UE functional use, and pain.   ACTIVITY LIMITATIONS: carrying, lifting, bathing, toileting, dressing, reach over head, and hygiene/grooming  PARTICIPATION LIMITATIONS: meal prep, cleaning, laundry, driving, shopping, community activity, occupation, and yard work  PERSONAL FACTORS: Age, Education, Fitness, and 1 comorbidity: Gout are also affecting patient's functional outcome.   REHAB POTENTIAL: Excellent  CLINICAL DECISION MAKING: Stable/uncomplicated  EVALUATION COMPLEXITY: Low   GOALS: Goals reviewed with patient? No  SHORT TERM GOALS: Target date: 07/11/2023  Patient will demonstrate undestanding of home exercise plan by performing exercises correctly with evidence of good carry over with min to no verbal or tactile cues .   Baseline: NT 07/11/23: Performing exercises independently  Goal status: ACHIEVED   LONG TERM GOALS: Target date: 09/19/2023  Patient will demonstrate a decrease of >=15 pts on QuickDash for right shoulder as evidence of  improved right shoulder function.  Baseline: 84% or 48 pts total  08/01/23: 61% Goal status: ACHIEVED    2.  Patient will demonstrate right shoulder AROM that is nearly symmetrical (within 10% of L shoulder ROM) to left shoulder for improved right shoulder function to perform  reaching and mouse manipulation tasks for his job.  Baseline: Shoulder Flex PROM R 45, Shoulder Abd R 45, AROM not performed at this point due to protocol  08/01/23: Right Shoulder Flex 90, Abd 50, IR 70, ER 20  Goal status: ONGOING   3.  Patient will demonstrate right shoulder MMT that is symmetrical to left shoulder for improved right shoulder function to perform reaching and mouse manipulation tasks for his job.  Baseline: MMT not performed yet  08/01/23: Shoulder Flex R 3-, Abd 3-, IR 3, ER 3-   Goal status: ONGOING   4.  Patient will be able to return to performing upper extremity weight lifting routine with RUE as evidence of improved right shoulder function to maintain physical health.  Baseline: Unable to perform  08/20/23: Able perform exercises on RUE but unweighted    Goal status: ONGOING  5.  Patient will be able to return to using right shoulder for reaching, typing and manipulating mouse for work related tasks as evidence of improved right shoulder function. Baseline: Can perform some typing but not manipulation of mouse at moment  Goal status: ACHIEVED   PLAN:  PT FREQUENCY: 1-2x/week  PT DURATION: 12 weeks  PLANNED INTERVENTIONS: 97164- PT Re-evaluation, 97750- Physical Performance Testing, 97110-Therapeutic exercises, 97530- Therapeutic activity, 97112- Neuromuscular re-education, 97535- Self Care, [id number]- Manual therapy, [id number]- Aquatic Therapy, [id number]- Electrical stimulation (unattended), [phone]- Electrical stimulation (manual), [id number]- Traction (mechanical), 20560 (1-2 muscles), 20561 (3+ muscles)- Dry Needling, Patient/Family education, Balance training, Taping, Joint mobilization, Joint manipulation, Spinal manipulation, Spinal mobilization, Cryotherapy, and Moist heat  PLAN FOR NEXT SESSION:  Side lying shoulder abduction ROM and scapular T'S.  Revisit periscapular strengthening including: side lying shoulder ER/IR. Single UE rows and pull downs.    Toribio Servant PT, DPT   [facility] [address], [place], [address] Phone: [phone]   Fax:  [phone]

## 2023-08-23 ENCOUNTER — Encounter: Admitting: Physical Therapy

## 2023-08-28 ENCOUNTER — Ambulatory Visit: Admitting: Physical Therapy

## 2023-08-28 DIAGNOSIS — Z9889 Other specified postprocedural states: Secondary | ICD-10-CM

## 2023-08-28 DIAGNOSIS — M25511 Pain in right shoulder: Secondary | ICD-10-CM

## 2023-08-28 NOTE — Therapy (Addendum)
 OUTPATIENT PHYSICAL THERAPY TREATMENT   Patient Name: Lonnie Castro MRN: [id number] DOB:[birth year], 58 y.o., male Today's Date: 08/28/2023  END OF SESSION:  PT End of Session - 08/28/23 1122     Visit Number 10    Number of Visits 25    Date for PT Re-Evaluation 09/19/23    Authorization Type [id number]    Authorization Time Period 12 approved 6/25-9/22    Authorization - Visit Number 10    Authorization - Number of Visits 12    Progress Note Due on Visit 12    PT Start Time 1120    PT Stop Time 1200    PT Time Calculation (min) 40 min    Activity Tolerance Patient tolerated treatment well;No increased pain    Behavior During Therapy [facility] for tasks assessed/performed              Past Medical History:  Diagnosis Date   Allergic rhinitis    Arthritis    Bronchitis 04/04/2021   Gout    Orchitis    Other fatigue 04/04/2021   Past Surgical History:  Procedure Laterality Date   COLONOSCOPY WITH PROPOFOL  N/A 11/03/2014   Procedure: COLONOSCOPY WITH PROPOFOL ;  Surgeon: Gladis RAYMOND Mariner, MD;  Location: [facility] ENDOSCOPY;  Service: Endoscopy;  Laterality: N/A;   COLONOSCOPY WITH PROPOFOL  N/A 05/04/2022   Procedure: COLONOSCOPY WITH PROPOFOL ;  Surgeon: Unk Corinn Skiff, MD;  Location: [facility] ENDOSCOPY;  Service: Gastroenterology;  Laterality: N/A;   FRACTURE SURGERY     HERNIA REPAIR  01/02/1982   NASAL SEPTUM SURGERY  01/02/1985   SHOULDER ARTHROSCOPY WITH ROTATOR CUFF REPAIR Right 06/21/2023   Procedure: ARTHROSCOPY, SHOULDER, WITH ROTATOR CUFF REPAIR;  Surgeon: Cristy Bonner DASEN, MD;  Location: [facility];  Service: Orthopedics;  Laterality: Right;   Patient Active Problem List   Diagnosis Date Noted   Screen for colon cancer 05/04/2022   Tinnitus 06/18/2015   Preventative health care 08/28/2014   Hyperlipidemia 08/28/2014   Eczema of external ear 08/28/2014   Chronic gout 05/25/2014   Rhinitis, allergic 05/25/2014    PCP: Comer Gaskins NP   REFERRING  PROVIDER: Dr. Bonner Cristy   REFERRING DIAG: [phone] (ICD-10-CM) - S/P arthroscopy of right shoulder  THERAPY DIAG:  Acute pain of right shoulder  S/P arthroscopy of right shoulder  Rationale for Evaluation and Treatment: Rehabilitation  ONSET DATE: 06/21/23  SUBJECTIVE:                                                                                                                                                                                      SUBJECTIVE STATEMENT: Pt states that he felt increased  soreness in his right arm after last session and he is not sure what caused it. He describes pain as sharp and he feels it when he brings it out into external rotation. He also reports noticing decreased shoulder flexion using measurement on wall.   PERTINENT HISTORY: Pt referred to therapy following right shoulder arthroscopy with a/c joint decompression, RTC debridement, biceps tenodesis and SLAP repair. His pain is well controlled and he presents with right arm in sling. He wants to return to lifting at gym and using his RUE for job related tasks like typing and moving mouse around.   PAIN:  Are you having pain?  No real pain at rest today   PRECAUTIONS: Shoulder -No AAROM or AROM of right shoulder until week 5 and/or cleared by surgeon   WEIGHT BEARING RESTRICTIONS:  No weight bearing through RUE    FALLS:  Has patient fallen in last 6 months? No  LIVING ENVIRONMENT: Lives with: lives with their spouse Lives in: House/apartment Stairs: Did not ask  Has following equipment at home: Did not ask   OCCUPATION: Psychologist, sport and exercise    PLOF: Independent  PATIENT GOALS: Wants to return to gym for UE exercise.   NEXT MD VISIT:   OBJECTIVE:  Note: Objective measures were completed at Evaluation unless otherwise noted.  PATIENT SURVEYS:  Quick Dash:   Please rate your ability do the following activities in the last week by selecting the number below the appropriate  response.   Activities Rating  Open a tight or new jar.  5 = Unable  Do heavy household chores (e.g., wash walls, floors). 5 = Unable  Carry a shopping bag or briefcase 5 = Unable  Wash your back. 5 = Unable  Use a knife to cut food. 5 = Unable  Recreational activities in which you take some force or impact through your arm, shoulder or hand (e.g., golf, hammering, tennis, etc.). 5 = Unable  During the past week, to what extent has your arm, shoulder or hand problem interfered with your normal social activities with family, friends, neighbors or groups?  5 = Extremely  During the past week, were you limited in your work or other regular daily activities as a result of your arm, shoulder or hand problem? 5 = Unable  During the past week, were you limited in your work or other regular daily activities as a result of your arm, shoulder or hand problem? 4 = Severe  Tingling (pins and needles) in your arm, shoulder or hand. 2 = Mild  During the past week, how much difficulty have you had sleeping because of the pain in your arm, shoulder or hand?  2 = Mild difficulty   (A QuickDASH score may not be calculated if there is greater than 1 missing item.)  Quick Dash Disability/Symptom Score: [(sum of  (48) responses/11 (n)] -1] x 25 = 84%  Minimally Clinically Important Difference (MCID): 15-20 points  (Franchignoni, F. et al. (2013). Minimally clinically important difference of the disabilities of the arm, shoulder, and hand outcome measures (DASH) and its shortened version (Quick DASH). Journal of Orthopaedic & Sports Physical Therapy, 44(1), 30-39)   COGNITION: Overall cognitive status: Within functional limits for tasks assessed     SENSATION: Not tested  POSTURE: No postural deficits noted   UPPER EXTREMITY ROM:   Active/Passive ROM Right eval Left eval Right   08/08/23 Right  08/13/23 Right  08/20/23 Left  08/20/23 Right   08/28/23  Shoulder flexion NT/45 deg  180 115/ NT  117/125  130/NT 180 150/155  Shoulder extension         Shoulder abduction NT/45 deg  65* 65/70  70/NT  170 75/NT  Shoulder adduction     30/NT 70 30  Shoulder ER at 90 deg Abd      70/NT 90   Shoulder ER at 60 deg Abd    30/NT 25/NT       Shoulder IR at 60 deg Abd     30/NT       Shoulder internal rotation         Shoulder external rotation         Elbow flexion NT/90        Elbow extension NT/0        Wrist flexion         Wrist extension         Wrist ulnar deviation         Wrist radial deviation         Wrist pronation         Wrist supination         (Blank rows = not tested)                                  (Blank rows = not tested)        UPPER EXTREMITY MMT:  MMT Right eval Left eval Right  08/08/23 Left  08/08/23 Right  08/20/23 Right  08/28/23 Left  08/28/23  Shoulder flexion 3-* 5 4- 4 4- 4+ 5  Shoulder extension         Shoulder abduction 3-* 5 3+ 4 4- 4 5  Shoulder adduction         Shoulder internal rotation 3 5 4- 4 4- 4 4+  Shoulder external rotation 3-* 5 4- 4 4- 4 4+  Shoulder internal rotation at 90 deg         Shoulder external rotation at 90 deg                                                                                                           TREATMENT DATE   08/28/23: THEREX   UBE seat at 9 2.5 forward and 2.5 backward with resistance at 3   Shoulder  ROM (See Above)   Shoulder MMT (See Above)   Bent Over Rows on #5 lb with DB 1 x 10  -tilt head of DB down to decrease bias with biceps   Bent Over Rows on #10 lb with KB 2 x 20    Side Lying Right Shoulder ER/IR at 0 deg abd #1 DB 2 x 30    SELF CARE HOME MANAGEMENT  Education about rehab protocol timeline and that he could likely return be able to return to loading shoulder at [redacted] weeks along with resuming riding a road bike and motor bike and with approval from Dr. Cristy.     PATIENT EDUCATION: Education details:  Form and technique for correct performance of exercise and explanation about  post op protocol  Person educated: Patient Education method: Explanation, Demonstration, and Verbal cues Education comprehension: verbalized understanding, returned demonstration, and verbal cues required  HOME EXERCISE PROGRAM: Access Code: [email or web address] URL: [email or web address] Date: 08/28/2023 Prepared by: Toribio Servant  Exercises - Seated Shoulder Flexion AAROM with Pulley Behind  - 1 x daily - 7 x weekly - 3 sets - 10 reps - Seated Shoulder Abduction AAROM with Pulley Behind  - 1 x daily - 7 x weekly - 3 sets - 10 reps - Standing Shoulder Scaption Wall Walk  - 1 x daily - 7 x weekly - 2 sets - 10 reps - Standing Shoulder Flexion Wall Walk  - 1 x daily - 7 x weekly - 2 sets - 10 reps - Standing Shoulder Abduction Wall Slide   - 1 x daily - 7 x weekly - 2 sets - 10 reps - Supine Shoulder External Internal Rotation AAROM with Dowel  - 1 x daily - 7 x weekly - 2 sets - 10 reps - Standing Shoulder Abduction AAROM with Dowel  - 1 x daily - 7 x weekly - 2 sets - 10 reps - Seated AAROM Shoulder ER at 45 deg with counter support using cane   - 1 x daily - 7 x weekly - 2 sets - 10 reps - Supine Shoulder Flexion Extension Full Range AROM  - 3-4 x weekly - 3 sets - 10 reps - Standing Single Arm Bicep Curls Supinated with Dumbbell  - 5-7 x weekly - 3-5 sets - 30 reps - Bent Over Single Arm Shoulder Row with Dumbbell  - 3-4 x weekly - 3 sets - 10 reps - Sidelying Shoulder External Rotation  - 2 x daily - 5-7 x weekly - 3 sets - 20 reps - Shoulder Flexion Wall Slide with Resistance Band  - 5-7 x weekly - 2 sets - 10 reps  ASSESSMENT:  CLINICAL IMPRESSION: Pt is s/p 10.5 weeks for right shoulder arthroscopy with RTC debridment, AC decompression, SLAP repair, and biceps tenodesis. Pt progressing towards goals with improvement in right shoulder strength and mobility. He did feel increased pain during session, but this did not limit him from performing exercises. The increase in his pain  is likely from him not taking as much NSAIDs as before. Due to ongoing protocol limitations with shoulder loading and lack of clarification from surgeon, pt focused on strengthening periscapular musculature. He will continue to benefit from skilled PT to address these aforementioned deficits to regain his right shoulder's prior level of function to return to lifting weights with right shoulder and to typing for his job.  OBJECTIVE IMPAIRMENTS: decreased knowledge of condition, decreased ROM, decreased strength, increased muscle spasms, impaired UE functional use, and pain.   ACTIVITY LIMITATIONS: carrying, lifting, bathing, toileting, dressing, reach over head, and hygiene/grooming  PARTICIPATION LIMITATIONS: meal prep, cleaning, laundry, driving, shopping, community activity, occupation, and yard work  PERSONAL FACTORS: Age, Education, Fitness, and 1 comorbidity: Gout are also affecting patient's functional outcome.   REHAB POTENTIAL: Excellent  CLINICAL DECISION MAKING: Stable/uncomplicated  EVALUATION COMPLEXITY: Low   GOALS: Goals reviewed with patient? No  SHORT TERM GOALS: Target date: 07/11/2023  Patient will demonstrate undestanding of home exercise plan by performing exercises correctly with evidence of good carry over with min to no verbal or tactile cues .   Baseline: NT 07/11/23: Performing exercises independently  Goal status: ACHIEVED   LONG TERM GOALS:  Target date: 09/19/2023  Patient will demonstrate a decrease of >=15 pts on QuickDash for right shoulder as evidence of improved right shoulder function.  Baseline: 84% or 48 pts total  08/01/23: 61% Goal status: ACHIEVED    2.  Patient will demonstrate right shoulder AROM that is nearly symmetrical (within 10% of L shoulder ROM) to left shoulder for improved right shoulder function to perform reaching and mouse manipulation tasks for his job.  Baseline: Shoulder Flex PROM R 45, Shoulder Abd R 45, AROM not performed at this  point due to protocol  08/01/23: Right Shoulder Flex 90, Abd 50, IR 70, ER 20  Goal status: ONGOING   3.  Patient will demonstrate right shoulder MMT that is symmetrical to left shoulder for improved right shoulder function to perform reaching and mouse manipulation tasks for his job.  Baseline: MMT not performed yet  08/01/23: Shoulder Flex R 3-, Abd 3-, IR 3, ER 3-  08/28/23: Shoulder Flex R/L 4/4, Abd 4/4,   Goal status: ONGOING   4.  Patient will be able to return to performing upper extremity weight lifting routine with RUE as evidence of improved right shoulder function to maintain physical health.  Baseline: Unable to perform  08/20/23: Able perform exercises on RUE but unweighted    Goal status: ONGOING  5.  Patient will be able to return to using right shoulder for reaching, typing and manipulating mouse for work related tasks as evidence of improved right shoulder function. Baseline: Can perform some typing but not manipulation of mouse at moment  Goal status: ACHIEVED   PLAN:  PT FREQUENCY: 1-2x/week  PT DURATION: 12 weeks  PLANNED INTERVENTIONS: 97164- PT Re-evaluation, 97750- Physical Performance Testing, 97110-Therapeutic exercises, 97530- Therapeutic activity, 97112- Neuromuscular re-education, 97535- Self Care, [id number]- Manual therapy, [id number]- Aquatic Therapy, H9716- Electrical stimulation (unattended), [phone]- Electrical stimulation (manual), [id number]- Traction (mechanical), 20560 (1-2 muscles), 20561 (3+ muscles)- Dry Needling, Patient/Family education, Balance training, Taping, Joint mobilization, Joint manipulation, Spinal manipulation, Spinal mobilization, Cryotherapy, and Moist heat  PLAN FOR NEXT SESSION:  Side lying shoulder abduction ROM and scapular T'S.  Revisit periscapular strengthening including: side lying shoulder ER/IR. Single UE rows and pull downs.    Toribio Servant PT, DPT  [facility] [address] [place],  [address] Phone: [phone]   Fax:  [phone]

## 2023-08-30 ENCOUNTER — Encounter: Admitting: Physical Therapy

## 2023-09-06 ENCOUNTER — Ambulatory Visit: Attending: Orthopaedic Surgery | Admitting: Physical Therapy

## 2023-09-06 DIAGNOSIS — M25511 Pain in right shoulder: Secondary | ICD-10-CM | POA: Insufficient documentation

## 2023-09-06 DIAGNOSIS — Z9889 Other specified postprocedural states: Secondary | ICD-10-CM | POA: Diagnosis not present

## 2023-09-06 NOTE — Therapy (Signed)
 OUTPATIENT PHYSICAL THERAPY TREATMENT   Patient Name: Lonnie Castro MRN: [id number] DOB:1965/08/22, 58 y.o., male Today's Date: 09/06/2023  END OF SESSION:  PT End of Session - 09/06/23 1123     Visit Number 11    Number of Visits 25    Date for PT Re-Evaluation 09/19/23    Authorization Type [id number]    Authorization Time Period 12 approved 6/25-9/22    Authorization - Visit Number 11    Authorization - Number of Visits 12    Progress Note Due on Visit 12    PT Start Time 1120    PT Stop Time 1200    PT Time Calculation (min) 40 min    Activity Tolerance Patient tolerated treatment well;No increased pain    Behavior During Therapy [facility] for tasks assessed/performed              Past Medical History:  Diagnosis Date   Allergic rhinitis    Arthritis    Bronchitis 04/04/2021   Gout    Orchitis    Other fatigue 04/04/2021   Past Surgical History:  Procedure Laterality Date   COLONOSCOPY WITH PROPOFOL  N/A 11/03/2014   Procedure: COLONOSCOPY WITH PROPOFOL ;  Surgeon: Gladis RAYMOND Mariner, MD;  Location: [facility] ENDOSCOPY;  Service: Endoscopy;  Laterality: N/A;   COLONOSCOPY WITH PROPOFOL  N/A 05/04/2022   Procedure: COLONOSCOPY WITH PROPOFOL ;  Surgeon: Unk Corinn Skiff, MD;  Location: [facility] ENDOSCOPY;  Service: Gastroenterology;  Laterality: N/A;   FRACTURE SURGERY     HERNIA REPAIR  01/02/1982   NASAL SEPTUM SURGERY  01/02/1985   SHOULDER ARTHROSCOPY WITH ROTATOR CUFF REPAIR Right 06/21/2023   Procedure: ARTHROSCOPY, SHOULDER, WITH ROTATOR CUFF REPAIR;  Surgeon: Cristy Bonner DASEN, MD;  Location: [facility];  Service: Orthopedics;  Laterality: Right;   Patient Active Problem List   Diagnosis Date Noted   Screen for colon cancer 05/04/2022   Tinnitus 06/18/2015   Preventative health care 08/28/2014   Hyperlipidemia 08/28/2014   Eczema of external ear 08/28/2014   Chronic gout 05/25/2014   Rhinitis, allergic 05/25/2014    PCP: Comer Gaskins NP   REFERRING  PROVIDER: Dr. Bonner Cristy   REFERRING DIAG: [phone] (ICD-10-CM) - S/P arthroscopy of right shoulder  THERAPY DIAG:  Acute pain of right shoulder  S/P arthroscopy of right shoulder  Rationale for Evaluation and Treatment: Rehabilitation  ONSET DATE: 06/21/23  SUBJECTIVE:                                                                                                                                                                                      SUBJECTIVE STATEMENT: Pt reports that he is feeling  an improvement in his right shoulder since last session with a decrease in overall pain.  He continues to perform active range motion or light resistance exercises.    PERTINENT HISTORY: Pt referred to therapy following right shoulder arthroscopy with a/c joint decompression, RTC debridement, biceps tenodesis and SLAP repair. His pain is well controlled and he presents with right arm in sling. He wants to return to lifting at gym and using his RUE for job related tasks like typing and moving mouse around.   PAIN:  Are you having pain?  No real pain at rest today   PRECAUTIONS: Shoulder -No AAROM or AROM of right shoulder until week 5 and/or cleared by surgeon   WEIGHT BEARING RESTRICTIONS:  No weight bearing through RUE    FALLS:  Has patient fallen in last 6 months? No  LIVING ENVIRONMENT: Lives with: lives with their spouse Lives in: House/apartment Stairs: Did not ask  Has following equipment at home: Did not ask   OCCUPATION: Psychologist, sport and exercise    PLOF: Independent  PATIENT GOALS: Wants to return to gym for UE exercise.   NEXT MD VISIT:   OBJECTIVE:  Note: Objective measures were completed at Evaluation unless otherwise noted.  PATIENT SURVEYS:  Quick Dash:   Please rate your ability do the following activities in the last week by selecting the number below the appropriate response.   Activities Rating  Open a tight or new jar.  5 = Unable  Do heavy household  chores (e.g., wash walls, floors). 5 = Unable  Carry a shopping bag or briefcase 5 = Unable  Wash your back. 5 = Unable  Use a knife to cut food. 5 = Unable  Recreational activities in which you take some force or impact through your arm, shoulder or hand (e.g., golf, hammering, tennis, etc.). 5 = Unable  During the past week, to what extent has your arm, shoulder or hand problem interfered with your normal social activities with family, friends, neighbors or groups?  5 = Extremely  During the past week, were you limited in your work or other regular daily activities as a result of your arm, shoulder or hand problem? 5 = Unable  During the past week, were you limited in your work or other regular daily activities as a result of your arm, shoulder or hand problem? 4 = Severe  Tingling (pins and needles) in your arm, shoulder or hand. 2 = Mild  During the past week, how much difficulty have you had sleeping because of the pain in your arm, shoulder or hand?  2 = Mild difficulty   (A QuickDASH score may not be calculated if there is greater than 1 missing item.)  Quick Dash Disability/Symptom Score: [(sum of  (48) responses/11 (n)] -1] x 25 = 84%  Minimally Clinically Important Difference (MCID): 15-20 points  (Franchignoni, F. et al. (2013). Minimally clinically important difference of the disabilities of the arm, shoulder, and hand outcome measures (DASH) and its shortened version (Quick DASH). Journal of Orthopaedic & Sports Physical Therapy, 44(1), 30-39)   COGNITION: Overall cognitive status: Within functional limits for tasks assessed     SENSATION: Not tested  POSTURE: No postural deficits noted   UPPER EXTREMITY ROM:   Active/Passive ROM Right eval Left eval Right   08/08/23 Right  08/13/23 Right  08/20/23 Left  08/20/23 Right   08/28/23  Shoulder flexion NT/45 deg 180 115/ NT  117/125 130/NT 180 150/155  Shoulder extension  Shoulder abduction NT/45 deg  65* 65/70   70/NT  170 75/NT  Shoulder adduction     30/NT 70 30  Shoulder ER at 90 deg Abd      70/NT 90   Shoulder ER at 60 deg Abd    30/NT 25/NT       Shoulder IR at 60 deg Abd     30/NT       Shoulder internal rotation         Shoulder external rotation         Elbow flexion NT/90        Elbow extension NT/0        Wrist flexion         Wrist extension         Wrist ulnar deviation         Wrist radial deviation         Wrist pronation         Wrist supination         (Blank rows = not tested)                                  (Blank rows = not tested)        UPPER EXTREMITY MMT:  MMT Right eval Left eval Right  08/08/23 Left  08/08/23 Right  08/20/23 Right  08/28/23 Left  08/28/23  Shoulder flexion 3-* 5 4- 4 4- 4+ 5  Shoulder extension         Shoulder abduction 3-* 5 3+ 4 4- 4 5  Shoulder adduction         Shoulder internal rotation 3 5 4- 4 4- 4 4+  Shoulder external rotation 3-* 5 4- 4 4- 4 4+  Shoulder internal rotation at 90 deg         Shoulder external rotation at 90 deg                                                                                                           TREATMENT DATE   09/06/23: THEREX   Shoulder Flexion in standing AROM on RUE 110-120 deg  -increased shoulder shrug    Shoulder Abduction in standing AROM on RUE 90 deg   -increased shoulder shrug    Forward Abduction in place of scaption  1 x 10  to 160 deg   -min VC to hold at terminal shoulder scaption for 5 sec  Quadruped Serratus (Forward flexion with forearm supination) 1 x 10    -Pt shows shoulder flexion restrictions  Serratus Wall Slides with foam roller 1 x 10   -min VC  to maintain constant pressure into foam roller     NMR   Left Side Lying Right Horizontal Abduction AROM 1 x 30   -min VC to maintain neutral or pronated hand to decrease amount of internal rotation  Left Side Lying Right Shoulder Abduction #1 DB  1 x 30   Left Side Lying  Right Shoulder Abduction #2 DB 1 x 30    Left Side Lying Right Horizontal Abduction AROM 1 x 30  Left Side Lying Right Horizontal Abduction #1 DB 1 x 30    Left Side Lying Right Shoulder IR/ER at 0 deg abduction AROM 1 x 10   Left Side Lying Right Shoulder IR/ER at 0 deg abduction #1 DB 1 x 10   Forward Flexion Wall Walks 1 x 10 to 160 deg flexion  -min VC to hold at terminal shoulder flexion for 5 sec     PATIENT EDUCATION: Education details:  Form and technique for correct performance of exercise and explanation about post op protocol  Person educated: Patient Education method: Explanation, Demonstration, and Verbal cues Education comprehension: verbalized understanding, returned demonstration, and verbal cues required  HOME EXERCISE PROGRAM: Access Code: [email or web address] URL: [email or web address] Date: 08/28/2023 Prepared by: Toribio Servant  Exercises - Seated Shoulder Flexion AAROM with Pulley Behind  - 1 x daily - 7 x weekly - 3 sets - 10 reps - Seated Shoulder Abduction AAROM with Pulley Behind  - 1 x daily - 7 x weekly - 3 sets - 10 reps - Standing Shoulder Scaption Wall Walk  - 1 x daily - 7 x weekly - 2 sets - 10 reps - Standing Shoulder Flexion Wall Walk  - 1 x daily - 7 x weekly - 2 sets - 10 reps - Supine Shoulder External Internal Rotation AAROM with Dowel  - 1 x daily - 7 x weekly - 2 sets - 10 reps - Seated AAROM Shoulder ER at 45 deg with counter support using cane   - 1 x daily - 7 x weekly - 2 sets - 10 reps - Supine Shoulder Flexion Extension Full Range AROM  - 3-4 x weekly - 3 sets - 10 reps - Standing Single Arm Bicep Curls Supinated with Dumbbell  - 5-7 x weekly - 3-5 sets - 30 reps - Bent Over Single Arm Shoulder Row with Dumbbell  - 3-4 x weekly - 3 sets - 10 reps - Sidelying Shoulder External Rotation  - 2 x daily - 5-7 x weekly - 3 sets - 20 reps - Sidelying Shoulder Horizontal Abduction  - 5-7 x weekly - 3 sets - 20 reps - Sidelying Shoulder Abduction Palm Forward  - 5-7 x weekly - 3 sets -  20 reps - Serratus Activation at Wall with Foam Roll  - 3-4 x weekly - 3 sets - 10 reps ASSESSMENT:  CLINICAL IMPRESSION: Pt is s/p 11 weeks for right shoulder arthroscopy with RTC debridment, AC decompression, SLAP repair, and biceps tenodesis. He continues to show progress towards goals with improvement in right shoulder strength with ability to perform active range in flexion and abduction to 90 degrees. He does continue to utilize upper trap with shoulder shrug. Focused on progressing shoulder ROM and strengthening and periscapular strengthening, which patient performed without pain and discomfort and without compensation in side lying gravity assisted position.      Pt progressing towards goals with improvement in right shoulder strength and mobility. He did feel increased pain during session, but this did not limit him from performing exercises. The increase in his pain is likely from him not taking as much NSAIDs as before. Due to ongoing protocol limitations with shoulder loading and lack of clarification from surgeon, pt focused on strengthening periscapular musculature. He will continue to benefit from skilled PT to address these aforementioned deficits to regain his right shoulder's prior level  of function to return to lifting weights with right shoulder and to typing for his job.  OBJECTIVE IMPAIRMENTS: decreased knowledge of condition, decreased ROM, decreased strength, increased muscle spasms, impaired UE functional use, and pain.   ACTIVITY LIMITATIONS: carrying, lifting, bathing, toileting, dressing, reach over head, and hygiene/grooming  PARTICIPATION LIMITATIONS: meal prep, cleaning, laundry, driving, shopping, community activity, occupation, and yard work  PERSONAL FACTORS: Age, Education, Fitness, and 1 comorbidity: Gout are also affecting patient's functional outcome.   REHAB POTENTIAL: Excellent  CLINICAL DECISION MAKING: Stable/uncomplicated  EVALUATION COMPLEXITY:  Low   GOALS: Goals reviewed with patient? No  SHORT TERM GOALS: Target date: 07/11/2023  Patient will demonstrate undestanding of home exercise plan by performing exercises correctly with evidence of good carry over with min to no verbal or tactile cues .   Baseline: NT 07/11/23: Performing exercises independently  Goal status: ACHIEVED   LONG TERM GOALS: Target date: 09/19/2023  Patient will demonstrate a decrease of >=15 pts on QuickDash for right shoulder as evidence of improved right shoulder function.  Baseline: 84% or 48 pts total  08/01/23: 61% Goal status: ACHIEVED    2.  Patient will demonstrate right shoulder AROM that is nearly symmetrical (within 10% of L shoulder ROM) to left shoulder for improved right shoulder function to perform reaching and mouse manipulation tasks for his job.  Baseline: Shoulder Flex PROM R 45, Shoulder Abd R 45, AROM not performed at this point due to protocol  08/01/23: Right Shoulder Flex 90, Abd 50, IR 70, ER 20  Goal status: ONGOING   3.  Patient will demonstrate right shoulder MMT that is symmetrical to left shoulder for improved right shoulder function to perform reaching and mouse manipulation tasks for his job.  Baseline: MMT not performed yet  08/01/23: Shoulder Flex R 3-, Abd 3-, IR 3, ER 3-  08/28/23: Shoulder Flex R/L 4/4, Abd 4/4,   Goal status: ONGOING   4.  Patient will be able to return to performing upper extremity weight lifting routine with RUE as evidence of improved right shoulder function to maintain physical health.  Baseline: Unable to perform  08/20/23: Able perform exercises on RUE but unweighted    Goal status: ONGOING  5.  Patient will be able to return to using right shoulder for reaching, typing and manipulating mouse for work related tasks as evidence of improved right shoulder function. Baseline: Can perform some typing but not manipulation of mouse at moment  Goal status: ACHIEVED   PLAN:  PT FREQUENCY: 1-2x/week  PT  DURATION: 12 weeks  PLANNED INTERVENTIONS: 97164- PT Re-evaluation, 97750- Physical Performance Testing, 97110-Therapeutic exercises, 97530- Therapeutic activity, [id number]- Neuromuscular re-education, 97535- Self Care, [id number]- Manual therapy, [id number]- Aquatic Therapy, [id number]- Electrical stimulation (unattended), [id number]- Electrical stimulation (manual), [id number]- Traction (mechanical), 20560 (1-2 muscles), 20561 (3+ muscles)- Dry Needling, Patient/Family education, Balance training, Taping, Joint mobilization, Joint manipulation, Spinal manipulation, Spinal mobilization, Cryotherapy, and Moist heat  PLAN FOR NEXT SESSION:  Re-measure shoulder A/PROM. Progress shoulder and periscapular ROM and strengthening exercises. Single UE lat pull downs and shoulder ER ROM and IR at 90 exercises . Forward lean shoulder flexion stretch and progress side lying shoulder abduction. Banded stars or ladders for shoulder strengthening. Shoulder flexion physioball roll in side lying.    Toribio Servant PT, DPT  [facility] [address] [place], [address] Phone: [phone]   Fax:  [phone]

## 2023-09-11 ENCOUNTER — Encounter: Admitting: Physical Therapy

## 2023-09-12 ENCOUNTER — Ambulatory Visit: Admitting: Physical Therapy

## 2023-09-12 ENCOUNTER — Encounter: Admitting: Physical Therapy

## 2023-09-12 DIAGNOSIS — Z9889 Other specified postprocedural states: Secondary | ICD-10-CM | POA: Diagnosis not present

## 2023-09-12 DIAGNOSIS — M25511 Pain in right shoulder: Secondary | ICD-10-CM | POA: Diagnosis not present

## 2023-09-12 NOTE — Therapy (Unsigned)
 OUTPATIENT PHYSICAL THERAPY TREATMENT   Patient Name: Lonnie Castro MRN: [id number] DOB:January 09, 1965, 58 y.o., male Today's Date: 09/12/2023  END OF SESSION:  PT End of Session - 09/12/23 1742     Visit Number 12    Number of Visits 25    Date for PT Re-Evaluation 09/19/23    Authorization Type [id number]    Authorization Time Period 12 approved 6/25-9/22    Authorization - Visit Number 12    Authorization - Number of Visits 12    Progress Note Due on Visit 12    PT Start Time 1735    PT Stop Time 1815    PT Time Calculation (min) 40 min    Activity Tolerance Patient tolerated treatment well;No increased pain    Behavior During Therapy [facility] for tasks assessed/performed               Past Medical History:  Diagnosis Date   Allergic rhinitis    Arthritis    Bronchitis 04/04/2021   Gout    Orchitis    Other fatigue 04/04/2021   Past Surgical History:  Procedure Laterality Date   COLONOSCOPY WITH PROPOFOL  N/A 11/03/2014   Procedure: COLONOSCOPY WITH PROPOFOL ;  Surgeon: Gladis RAYMOND Mariner, MD;  Location: [facility] ENDOSCOPY;  Service: Endoscopy;  Laterality: N/A;   COLONOSCOPY WITH PROPOFOL  N/A 05/04/2022   Procedure: COLONOSCOPY WITH PROPOFOL ;  Surgeon: Unk Corinn Skiff, MD;  Location: [facility] ENDOSCOPY;  Service: Gastroenterology;  Laterality: N/A;   FRACTURE SURGERY     HERNIA REPAIR  01/02/1982   NASAL SEPTUM SURGERY  01/02/1985   SHOULDER ARTHROSCOPY WITH ROTATOR CUFF REPAIR Right 06/21/2023   Procedure: ARTHROSCOPY, SHOULDER, WITH ROTATOR CUFF REPAIR;  Surgeon: Cristy Bonner DASEN, MD;  Location:  SURGERY CENTER;  Service: Orthopedics;  Laterality: Right;   Patient Active Problem List   Diagnosis Date Noted   Screen for colon cancer 05/04/2022   Tinnitus 06/18/2015   Preventative health care 08/28/2014   Hyperlipidemia 08/28/2014   Eczema of external ear 08/28/2014   Chronic gout 05/25/2014   Rhinitis, allergic 05/25/2014    PCP: Comer Gaskins NP   REFERRING  PROVIDER: Dr. Bonner Cristy   REFERRING DIAG: [id number] (ICD-10-CM) - S/P arthroscopy of right shoulder  THERAPY DIAG:  Acute pain of right shoulder  S/P arthroscopy of right shoulder  Rationale for Evaluation and Treatment: Rehabilitation  ONSET DATE: 06/21/23  SUBJECTIVE:                                                                                                                                                                                      SUBJECTIVE STATEMENT: Pt reports that he is  feeling an improvement in his right shoulder since last session with a decrease in overall pain.  He continues to perform active range motion or light resistance exercises.    PERTINENT HISTORY: Pt referred to therapy following right shoulder arthroscopy with a/c joint decompression, RTC debridement, biceps tenodesis and SLAP repair. His pain is well controlled and he presents with right arm in sling. He wants to return to lifting at gym and using his RUE for job related tasks like typing and moving mouse around.   PAIN:  Are you having pain?  No real pain at rest today   PRECAUTIONS: Shoulder -No AAROM or AROM of right shoulder until week 5 and/or cleared by surgeon   WEIGHT BEARING RESTRICTIONS:  No weight bearing through RUE    FALLS:  Has patient fallen in last 6 months? No  LIVING ENVIRONMENT: Lives with: lives with their spouse Lives in: House/apartment Stairs: Did not ask  Has following equipment at home: Did not ask   OCCUPATION: Psychologist, sport and exercise    PLOF: Independent  PATIENT GOALS: Wants to return to gym for UE exercise.   NEXT MD VISIT:   OBJECTIVE:  Note: Objective measures were completed at Evaluation unless otherwise noted.  PATIENT SURVEYS:  Quick Dash:   Please rate your ability do the following activities in the last week by selecting the number below the appropriate response.   Activities Rating  Open a tight or new jar.  5 = Unable  Do heavy household  chores (e.g., wash walls, floors). 5 = Unable  Carry a shopping bag or briefcase 5 = Unable  Wash your back. 5 = Unable  Use a knife to cut food. 5 = Unable  Recreational activities in which you take some force or impact through your arm, shoulder or hand (e.g., golf, hammering, tennis, etc.). 5 = Unable  During the past week, to what extent has your arm, shoulder or hand problem interfered with your normal social activities with family, friends, neighbors or groups?  5 = Extremely  During the past week, were you limited in your work or other regular daily activities as a result of your arm, shoulder or hand problem? 5 = Unable  During the past week, were you limited in your work or other regular daily activities as a result of your arm, shoulder or hand problem? 4 = Severe  Tingling (pins and needles) in your arm, shoulder or hand. 2 = Mild  During the past week, how much difficulty have you had sleeping because of the pain in your arm, shoulder or hand?  2 = Mild difficulty   (A QuickDASH score may not be calculated if there is greater than 1 missing item.)  Quick Dash Disability/Symptom Score: [(sum of  (48) responses/11 (n)] -1] x 25 = 84%  Minimally Clinically Important Difference (MCID): 15-20 points  (Franchignoni, F. et al. (2013). Minimally clinically important difference of the disabilities of the arm, shoulder, and hand outcome measures (DASH) and its shortened version (Quick DASH). Journal of Orthopaedic & Sports Physical Therapy, 44(1), 30-39)   COGNITION: Overall cognitive status: Within functional limits for tasks assessed     SENSATION: Not tested  POSTURE: No postural deficits noted   UPPER EXTREMITY ROM:   Active/Passive ROM Right eval Left eval Right   08/08/23 Right  08/13/23 Right  08/20/23 Left  08/20/23 Right   08/28/23 Right 09/12/23  Shoulder flexion NT/45 deg 180 115/ NT  117/125 130/NT 180 150/155 140  Shoulder extension  Shoulder abduction NT/45  deg  65* 65/70  70/NT  170 75/NT 120  Shoulder adduction     30/NT 70 30 50  Shoulder ER at 90 deg Abd      70/NT 90    Shoulder ER at 60 deg Abd    30/NT 25/NT        Shoulder IR at 60 deg Abd     30/NT        Shoulder internal rotation          Shoulder external rotation          Elbow flexion NT/90         Elbow extension NT/0         Wrist flexion          Wrist extension          Wrist ulnar deviation          Wrist radial deviation          Wrist pronation          Wrist supination          (Blank rows = not tested)                                  (Blank rows = not tested)        UPPER EXTREMITY MMT:  MMT Right eval Left eval Right  08/08/23 Left  08/08/23 Right  08/20/23 Right  08/28/23 Left  08/28/23  Shoulder flexion 3-* 5 4- 4 4- 4+ 5  Shoulder extension         Shoulder abduction 3-* 5 3+ 4 4- 4 5  Shoulder adduction         Shoulder internal rotation 3 5 4- 4 4- 4 4+  Shoulder external rotation 3-* 5 4- 4 4- 4 4+  Shoulder internal rotation at 90 deg         Shoulder external rotation at 90 deg                                                                                                           TREATMENT DATE   09/12/23: THEREX   UBE   Shoulder ROM (See Above)    09/06/23: THEREX   Shoulder Flexion in standing AROM on RUE 110-120 deg  -increased shoulder shrug    Shoulder Abduction in standing AROM on RUE 90 deg   -increased shoulder shrug    Forward Abduction in place of scaption  1 x 10  to 160 deg   -min VC to hold at terminal shoulder scaption for 5 sec  Quadruped Serratus (Forward flexion with forearm supination) 1 x 10    -Pt shows shoulder flexion restrictions  Serratus Wall Slides with foam roller 1 x 10   -min VC  to maintain constant pressure into foam roller     NMR   Left Side Lying Right Horizontal Abduction AROM 1 x 30   -min VC to  maintain neutral or pronated hand to decrease amount of internal rotation  Left Side Lying Right  Shoulder Abduction #1 DB  1 x 30   Left Side Lying Right Shoulder Abduction #2 DB 1 x 30   Left Side Lying Right Horizontal Abduction AROM 1 x 30  Left Side Lying Right Horizontal Abduction #1 DB 1 x 30    Left Side Lying Right Shoulder IR/ER at 0 deg abduction AROM 1 x 10   Left Side Lying Right Shoulder IR/ER at 0 deg abduction #1 DB 1 x 10   Forward Flexion Wall Walks 1 x 10 to 160 deg flexion  -min VC to hold at terminal shoulder flexion for 5 sec     PATIENT EDUCATION: Education details:  Form and technique for correct performance of exercise and explanation about post op protocol  Person educated: Patient Education method: Explanation, Demonstration, and Verbal cues Education comprehension: verbalized understanding, returned demonstration, and verbal cues required  HOME EXERCISE PROGRAM: Access Code: [email or web address] URL: [email or web address] Date: 09/12/2023 Prepared by: Toribio Servant  Exercises - Seated Shoulder Flexion AAROM with Pulley Behind  - 1 x daily - 7 x weekly - 3 sets - 10 reps - Seated Shoulder Abduction AAROM with Pulley Behind  - 1 x daily - 7 x weekly - 3 sets - 10 reps - Standing Shoulder Scaption Wall Walk  - 1 x daily - 7 x weekly - 2 sets - 10 reps - Standing Shoulder Flexion Wall Walk  - 1 x daily - 7 x weekly - 2 sets - 10 reps - Standing Shoulder Abduction Wall Slide   - 1 x daily - 7 x weekly - 2 sets - 10 reps - Supine Shoulder External Internal Rotation AAROM with Dowel  - 1 x daily - 7 x weekly - 2 sets - 10 reps - Seated AAROM Shoulder ER at 45 deg with counter support using cane   - 1 x daily - 7 x weekly - 2 sets - 10 reps - Standing 'L' Stretch at Counter  - 1 x daily - 7 x weekly - 3 reps - 60 sec hold - Standing Single Arm Bicep Curls Supinated with Dumbbell  - 5-7 x weekly - 3-5 sets - 30 reps - Bent Over Single Arm Shoulder Row with Dumbbell  - 3-4 x weekly - 3 sets - 10 reps - Sidelying Shoulder External Rotation  - 2 x daily - 5-7 x  weekly - 3 sets - 20 reps - Serratus Activation at Wall with Foam Roll  - 3-4 x weekly - 3 sets - 10 reps - Sidelying Shoulder Horizontal Abduction  - 5-7 x weekly - 3 sets - 20 reps - Sidelying Shoulder Abduction Palm Forward  - 5-7 x weekly - 3 sets - 20 reps - Scare the Bear   - 1 x daily - 5-7 x weekly - 2 sets - 10 reps - 5 sec hold  ASSESSMENT:  CLINICAL IMPRESSION: Pt is s/p 12 weeks for right shoulder arthroscopy with RTC debridment, AC decompression, SLAP repair, and biceps tenodesis. He continues to show improvement in right shoulder ROM. He continues to experience pain at terminal flexion, external rotation at 90 degrees abduction and scaption along with restriction. He has a follow up apt with his orthopedist this upcoming Friday and he will be gone for next week on vacation. He will continue to benefit from skilled PT to increase his right shoulder mobility and strength for improved shoulder function.  OBJECTIVE IMPAIRMENTS: decreased knowledge of condition, decreased ROM, decreased strength, increased muscle spasms, impaired UE functional use, and pain.   ACTIVITY LIMITATIONS: carrying, lifting, bathing, toileting, dressing, reach over head, and hygiene/grooming  PARTICIPATION LIMITATIONS: meal prep, cleaning, laundry, driving, shopping, community activity, occupation, and yard work  PERSONAL FACTORS: Age, Education, Fitness, and 1 comorbidity: Gout are also affecting patient's functional outcome.   REHAB POTENTIAL: Excellent  CLINICAL DECISION MAKING: Stable/uncomplicated  EVALUATION COMPLEXITY: Low   GOALS: Goals reviewed with patient? No  SHORT TERM GOALS: Target date: 07/11/2023  Patient will demonstrate undestanding of home exercise plan by performing exercises correctly with evidence of good carry over with min to no verbal or tactile cues .   Baseline: NT 07/11/23: Performing exercises independently  Goal status: ACHIEVED   LONG TERM GOALS: Target date:  09/19/2023  Patient will demonstrate a decrease of >=15 pts on QuickDash for right shoulder as evidence of improved right shoulder function.  Baseline: 84% or 48 pts total  08/01/23: 61% Goal status: ACHIEVED    2.  Patient will demonstrate right shoulder AROM that is nearly symmetrical (within 10% of L shoulder ROM) to left shoulder for improved right shoulder function to perform reaching and mouse manipulation tasks for his job.  Baseline: Shoulder Flex PROM R 45, Shoulder Abd R 45, AROM not performed at this point due to protocol  08/01/23: Right Shoulder Flex 90, Abd 50, IR 70, ER 20 09/12/23: Shoulder Flex R 145 (160 with repeated motions), ER at 90 deg R 50 deg,  Shoulder Abd R 120    Goal status: ONGOING   3.  Patient will demonstrate right shoulder MMT that is symmetrical to left shoulder for improved right shoulder function to perform reaching and mouse manipulation tasks for his job.  Baseline: MMT not performed yet  08/01/23: Shoulder Flex R 3-, Abd 3-, IR 3, ER 3-  08/28/23: Shoulder Flex R/L 4/4, Abd 4/4,   Goal status: ONGOING   4.  Patient will be able to return to performing upper extremity weight lifting routine with RUE as evidence of improved right shoulder function to maintain physical health.  Baseline: Unable to perform  08/20/23: Able perform exercises on RUE but unweighted    Goal status: ONGOING  5.  Patient will be able to return to using right shoulder for reaching, typing and manipulating mouse for work related tasks as evidence of improved right shoulder function. Baseline: Can perform some typing but not manipulation of mouse at moment  Goal status: ACHIEVED   PLAN:  PT FREQUENCY: 1-2x/week  PT DURATION: 12 weeks  PLANNED INTERVENTIONS: 97164- PT Re-evaluation, 97750- Physical Performance Testing, 97110-Therapeutic exercises, 97530- Therapeutic activity, [id number]- Neuromuscular re-education, 97535- Self Care, [id number]- Manual therapy, [id number]- Aquatic Therapy, [id number]-  Electrical stimulation (unattended), [phone]- Electrical stimulation (manual), [id number]- Traction (mechanical), 20560 (1-2 muscles), 20561 (3+ muscles)- Dry Needling, Patient/Family education, Balance training, Taping, Joint mobilization, Joint manipulation, Spinal manipulation, Spinal mobilization, Cryotherapy, and Moist heat  PLAN FOR NEXT SESSION:  Re-measure shoulder A/PROM and MMT. Progress shoulder and periscapular ROM and strengthening exercises. Single UE lat pull downs and shoulder ER ROM and IR at 90 exercises . Forward lean shoulder flexion stretch and progress side lying shoulder abduction. Banded stars or ladders for shoulder strengthening. Shoulder flexion physioball roll in side lying.    Toribio Servant PT, DPT  [facility] [address], [place], [address] Phone: [phone]   Fax:  [phone]

## 2023-09-13 NOTE — Addendum Note (Signed)
 Addended by: THEOTIS TORIBIO PARAS on: 09/13/2023 10:12 AM   Modules accepted: Orders

## 2023-09-14 DIAGNOSIS — M24111 Other articular cartilage disorders, right shoulder: Secondary | ICD-10-CM | POA: Diagnosis not present

## 2023-09-18 ENCOUNTER — Ambulatory Visit: Admitting: Physical Therapy

## 2023-09-20 IMAGING — DX DG CHEST 2V
2 series · 2 of 2 positions shown · non-contrast
Comparison: None.

CLINICAL DATA: 55-year-old male with left upper lobe wheezing

EXAM:
CHEST - 2 VIEW

[chest pa]
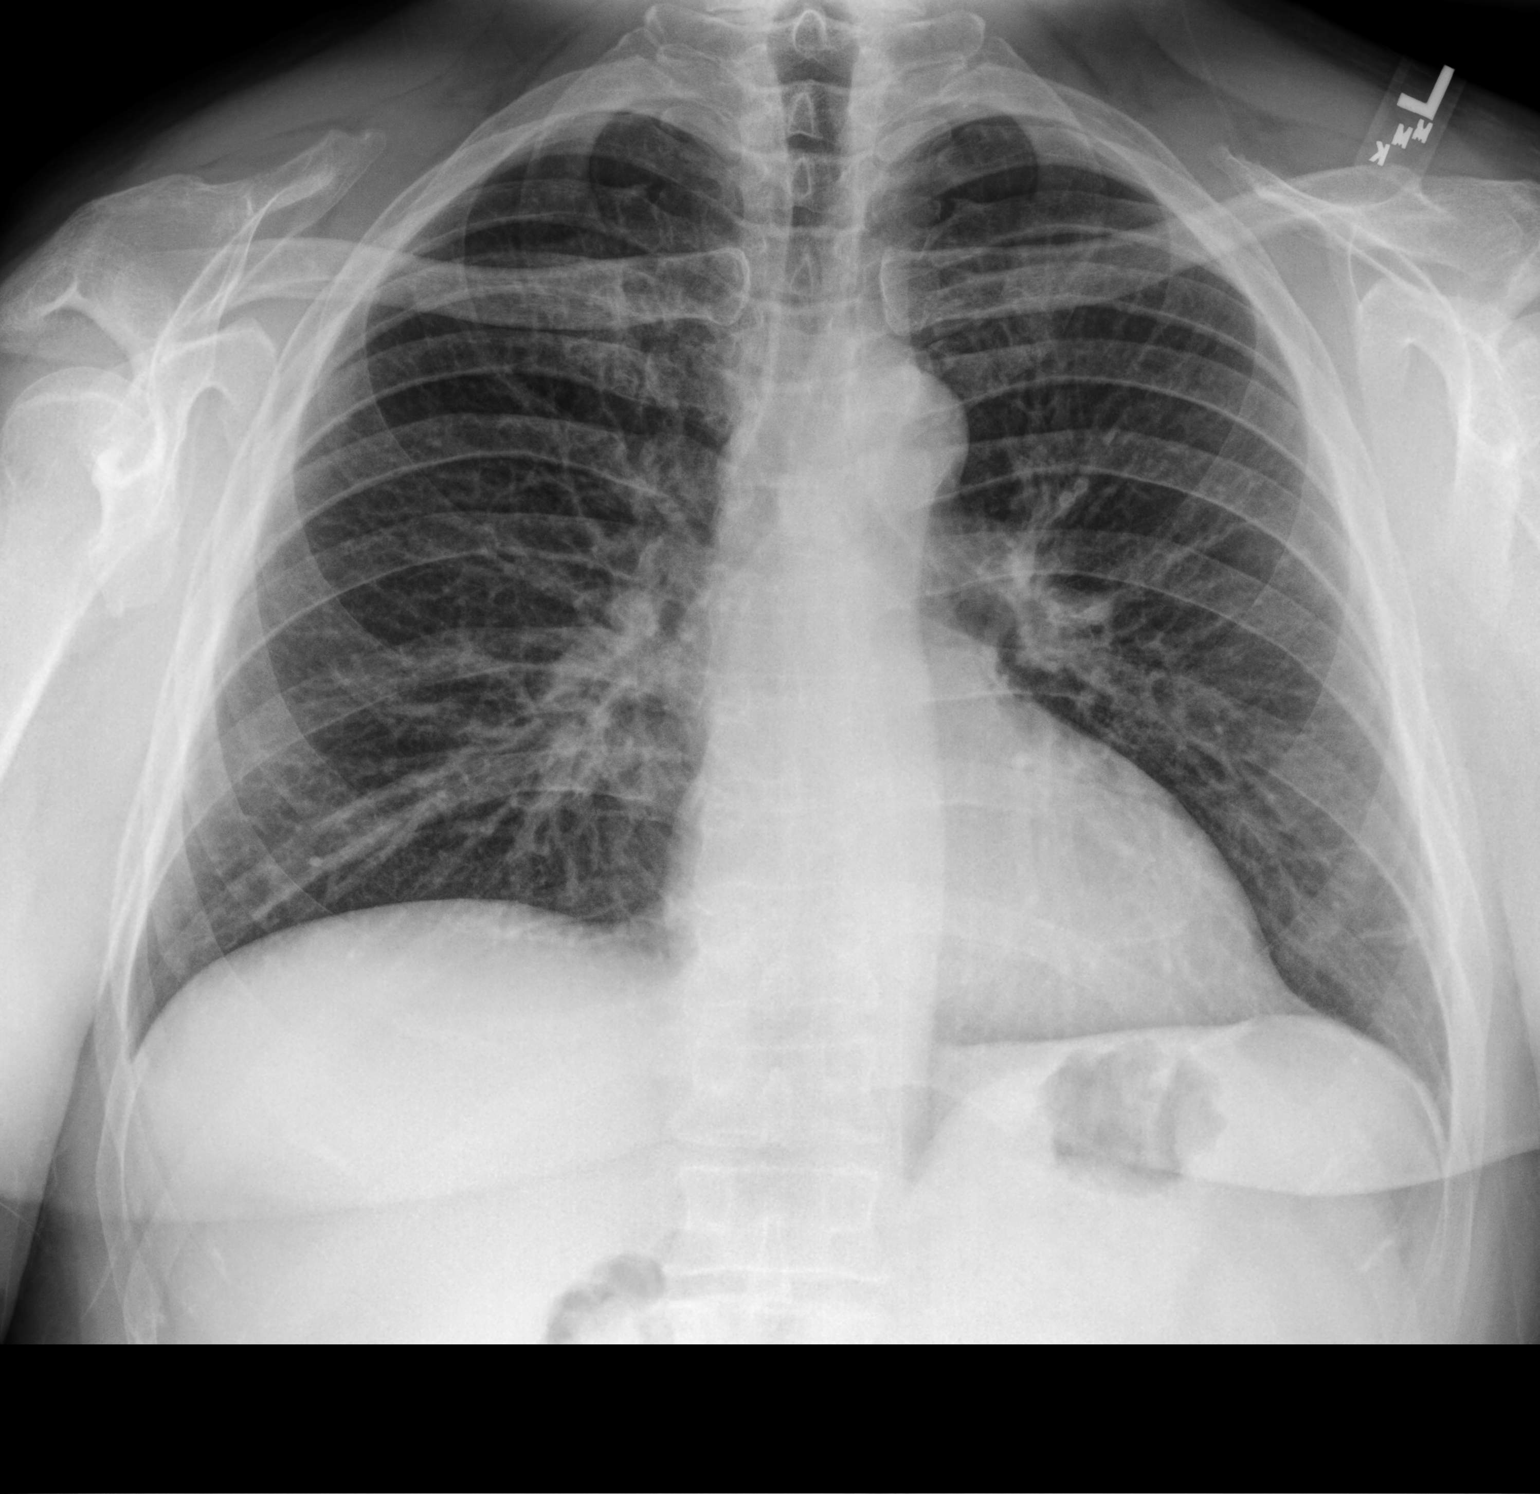

[chest lat]
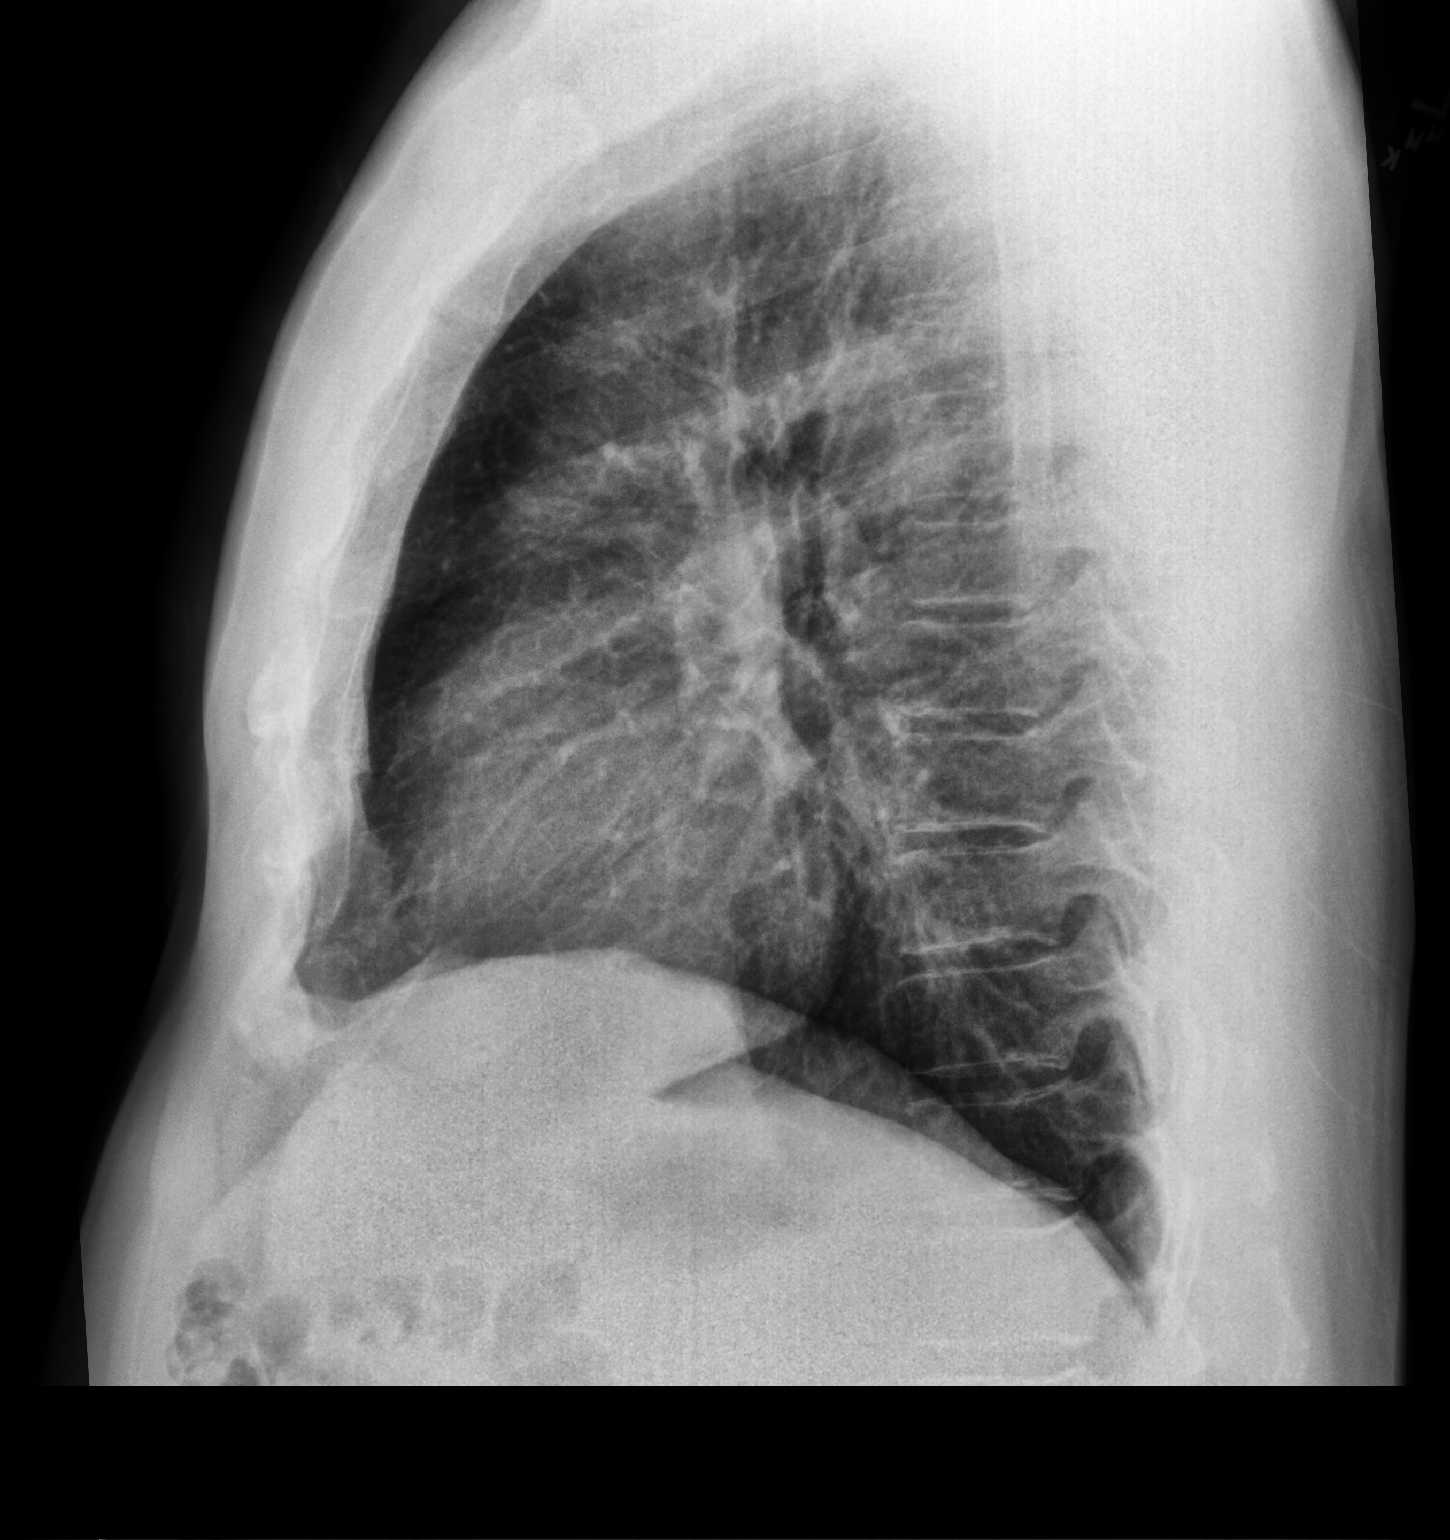

[2 of 2 positions shown; findings below may reference images not displayed]

FINDINGS: Cardiomediastinal silhouette within normal limits in size and
contour. No evidence of central vascular congestion. No interlobular
septal thickening.

No pneumothorax or pleural effusion. Coarsened interstitial
markings, with no confluent airspace disease.

No acute displaced fracture. Degenerative changes of the spine.
IMPRESSION: No active cardiopulmonary disease.

## 2023-09-25 ENCOUNTER — Encounter: Admitting: Physical Therapy

## 2023-10-02 ENCOUNTER — Ambulatory Visit: Admitting: Physical Therapy

## 2023-10-02 DIAGNOSIS — M25511 Pain in right shoulder: Secondary | ICD-10-CM | POA: Diagnosis not present

## 2023-10-02 DIAGNOSIS — Z9889 Other specified postprocedural states: Secondary | ICD-10-CM

## 2023-10-02 NOTE — Therapy (Addendum)
 OUTPATIENT PHYSICAL THERAPY TREATMENT    Patient Name: Lonnie Castro MRN: [id number] DOB:[birth year], 58 y.o., male Today's Date: 10/02/2023  END OF SESSION:  PT End of Session - 10/02/23 1608     Visit Number 13    Number of Visits 25    Date for Recertification  12/13/23    Authorization Type [id number]    Authorization Time Period 5 approved  9/24-11/22    Authorization - Visit Number 1    Authorization - Number of Visits 5    Progress Note Due on Visit 17    PT Start Time 1600    PT Stop Time 1645    PT Time Calculation (min) 45 min    Activity Tolerance Patient tolerated treatment well;No increased pain    Behavior During Therapy [facility] for tasks assessed/performed                Past Medical History:  Diagnosis Date   Allergic rhinitis    Arthritis    Bronchitis 04/04/2021   Gout    Orchitis    Other fatigue 04/04/2021   Past Surgical History:  Procedure Laterality Date   COLONOSCOPY WITH PROPOFOL  N/A 11/03/2014   Procedure: COLONOSCOPY WITH PROPOFOL ;  Surgeon: Gladis RAYMOND Mariner, MD;  Location: [facility] ENDOSCOPY;  Service: Endoscopy;  Laterality: N/A;   COLONOSCOPY WITH PROPOFOL  N/A 05/04/2022   Procedure: COLONOSCOPY WITH PROPOFOL ;  Surgeon: Unk Corinn Skiff, MD;  Location: [facility] ENDOSCOPY;  Service: Gastroenterology;  Laterality: N/A;   FRACTURE SURGERY     HERNIA REPAIR  01/02/1982   NASAL SEPTUM SURGERY  01/02/1985   SHOULDER ARTHROSCOPY WITH ROTATOR CUFF REPAIR Right 06/21/2023   Procedure: ARTHROSCOPY, SHOULDER, WITH ROTATOR CUFF REPAIR;  Surgeon: Cristy Bonner DASEN, MD;  Location: [facility];  Service: Orthopedics;  Laterality: Right;   Patient Active Problem List   Diagnosis Date Noted   Screen for colon cancer 05/04/2022   Tinnitus 06/18/2015   Preventative health care 08/28/2014   Hyperlipidemia 08/28/2014   Eczema of external ear 08/28/2014   Chronic gout 05/25/2014   Rhinitis, allergic 05/25/2014    PCP: Comer Gaskins NP    REFERRING PROVIDER: Dr. Bonner Cristy   REFERRING DIAG: [phone] (ICD-10-CM) - S/P arthroscopy of right shoulder  THERAPY DIAG:  Acute pain of right shoulder  S/P arthroscopy of right shoulder  Rationale for Evaluation and Treatment: Rehabilitation  ONSET DATE: 06/21/23  SUBJECTIVE:                                                                                                                                                                                      SUBJECTIVE STATEMENT: Pt states  that his shoulder pain has been relatively low. He saw his doctor for 12 week follow up for and they gave him the go ahead to start lifting weights.      PERTINENT HISTORY: Pt referred to therapy following right shoulder arthroscopy with a/c joint decompression, RTC debridement, biceps tenodesis and SLAP repair. His pain is well controlled and he presents with right arm in sling. He wants to return to lifting at gym and using his RUE for job related tasks like typing and moving mouse around.   PAIN:  Are you having pain?  No real pain at rest today   PRECAUTIONS: Shoulder -No AAROM or AROM of right shoulder until week 5 and/or cleared by surgeon   WEIGHT BEARING RESTRICTIONS:  No weight bearing through RUE    FALLS:  Has patient fallen in last 6 months? No  LIVING ENVIRONMENT: Lives with: lives with their spouse Lives in: House/apartment Stairs: Did not ask  Has following equipment at home: Did not ask   OCCUPATION: Psychologist, sport and exercise    PLOF: Independent  PATIENT GOALS: Wants to return to gym for UE exercise.   NEXT MD VISIT: Friday, September 12th 2025   OBJECTIVE:  Note: Objective measures were completed at Evaluation unless otherwise noted.  PATIENT SURVEYS:  Quick Dash:   Please rate your ability do the following activities in the last week by selecting the number below the appropriate response.   Activities Rating  Open a tight or new jar.  5 = Unable  Do heavy  household chores (e.g., wash walls, floors). 5 = Unable  Carry a shopping bag or briefcase 5 = Unable  Wash your back. 5 = Unable  Use a knife to cut food. 5 = Unable  Recreational activities in which you take some force or impact through your arm, shoulder or hand (e.g., golf, hammering, tennis, etc.). 5 = Unable  During the past week, to what extent has your arm, shoulder or hand problem interfered with your normal social activities with family, friends, neighbors or groups?  5 = Extremely  During the past week, were you limited in your work or other regular daily activities as a result of your arm, shoulder or hand problem? 5 = Unable  During the past week, were you limited in your work or other regular daily activities as a result of your arm, shoulder or hand problem? 4 = Severe  Tingling (pins and needles) in your arm, shoulder or hand. 2 = Mild  During the past week, how much difficulty have you had sleeping because of the pain in your arm, shoulder or hand?  2 = Mild difficulty   (A QuickDASH score may not be calculated if there is greater than 1 missing item.)  Quick Dash Disability/Symptom Score: [(sum of  (48) responses/11 (n)] -1] x 25 = 84%  Minimally Clinically Important Difference (MCID): 15-20 points  (Franchignoni, F. et al. (2013). Minimally clinically important difference of the disabilities of the arm, shoulder, and hand outcome measures (DASH) and its shortened version (Quick DASH). Journal of Orthopaedic & Sports Physical Therapy, 44(1), 30-39)   COGNITION: Overall cognitive status: Within functional limits for tasks assessed     SENSATION: Not tested  POSTURE: No postural deficits noted   UPPER EXTREMITY ROM:   Active/Passive ROM Right eval Left eval Right   08/08/23 Right  08/13/23 Right  08/20/23 Left  08/20/23 Right   08/28/23 Right 09/12/23 Right   10/02/23    Shoulder flexion NT/45 deg  180 115/ NT  117/125 130/NT 180 150/155 140 150    Shoulder extension            Shoulder abduction NT/45 deg  65* 65/70  70/NT  170 75/NT 120 147   Shoulder adduction     30/NT 70 30 50 50/55  Shoulder ER at 90 deg Abd      70/NT 90     Shoulder ER at 60 deg Abd    30/NT 25/NT         Shoulder IR at 60 deg Abd     30/NT         Shoulder internal rotation           Shoulder external rotation           Elbow flexion NT/90          Elbow extension NT/0          Wrist flexion           Wrist extension           Wrist ulnar deviation           Wrist radial deviation           Wrist pronation           Wrist supination           (Blank rows = not tested)                                  (Blank rows = not tested)        UPPER EXTREMITY MMT:  MMT Right eval Left eval Right  08/08/23 Left  08/08/23 Right  08/20/23 Right  08/28/23 Left  08/28/23 Right     Shoulder flexion 3-* 5 4- 4 4- 4+ 5 5  Shoulder extension          Shoulder abduction 3-* 5 3+ 4 4- 4 5 5   Shoulder adduction          Shoulder internal rotation 3 5 4- 4 4- 4 4+ 4+  Shoulder external rotation 3-* 5 4- 4 4- 4 4+ 4+  Shoulder internal rotation at 90 deg        4+  Shoulder external rotation at 90 deg          4+                                                                                                    TREATMENT DATE   10/02/23:  THEREX  Shoulder ROM and MMT (See above)   Bent Over T's  with #2 DB 2 x 10   UBE seat at 9 2.5 min forward and 2.5 min backward with resistance at 2         NMR: mirror in front of patient for visual input. Shoulder flexion with visual input to decrease shoulder elevation 3 x 20  -mod VC and TC for increased scapular retraction Shoulder scaption with visual input 3 x 20 -mod  VC and TC for increased scapular retraction Lower Trap Setting 3 x 10  -min VC to decrease thoracic extension    PATIENT EDUCATION: Education details:  Form and technique for correct performance of exercise and explanation about post op protocol  Person educated:  Patient Education method: Explanation, Demonstration, and Verbal cues Education comprehension: verbalized understanding, returned demonstration, and verbal cues required  HOME EXERCISE PROGRAM: Access Code: [email or web address] URL: [email or web address] Date: 10/02/2023 Prepared by: Toribio Servant  Program Notes Side Wall Stretch  3   Exercises - Standing Shoulder Scaption Wall Walk  - 1 x daily - 7 x weekly - 2 sets - 10 reps - Standing Shoulder Flexion Wall Walk  - 1 x daily - 7 x weekly - 2 sets - 10 reps - Standing Shoulder Abduction Wall Slide   - 1 x daily - 7 x weekly - 2 sets - 10 reps - Supine Shoulder External Internal Rotation AAROM with Dowel  - 1 x daily - 7 x weekly - 2 sets - 10 reps - Seated AAROM Shoulder ER at 45 deg with counter support using cane   - 1 x daily - 7 x weekly - 2 sets - 10 reps - Standing 'L' Stretch at Counter  - 1 x daily - 7 x weekly - 3 reps - 60 sec hold - Standing Single Arm Bicep Curls Supinated with Dumbbell  - 5-7 x weekly - 3-5 sets - 30 reps - Bent Over Single Arm Shoulder Row with Dumbbell  - 3-4 x weekly - 3 sets - 10 reps - Sidelying Shoulder External Rotation  - 2 x daily - 5-7 x weekly - 3 sets - 20 reps - Serratus Activation at Wall with Foam Roll  - 3-4 x weekly - 3 sets - 10 reps - Sidelying Shoulder Abduction Palm Forward  - 5-7 x weekly - 3 sets - 20 reps - Scare the Bear   - 1 x daily - 5-7 x weekly - 2 sets - 10 reps - 5 sec hold - Single Arm Bent Over Shoulder Horizontal Abduction with Dumbbell - Palm Down  - 3-4 x weekly - 3 sets - 10 reps - Single Arm Low Trap Setting at Wall  - 5-7 x weekly - 3 sets - 20 reps - Standing Shoulder Scaption  - 5-7 x weekly - 3 sets - 20 reps  ASSESSMENT:  CLINICAL IMPRESSION: Pt is s/p 15 weeks for right shoulder arthroscopy with RTC debridment, AC decompression, SLAP repair, and biceps tenodesis. He shows improved right shoulder strength and ROM. He continues to have pain at end range  external rotation and flexion with firm end field. He also exhibits increased shoulder elevation with upper trap activation as compensation for overhead shoulder movement. He will continue to benefit from skilled PT to increase his right shoulder mobility and strength for improved shoulder function.    OBJECTIVE IMPAIRMENTS: decreased knowledge of condition, decreased ROM, decreased strength, increased muscle spasms, impaired UE functional use, and pain.   ACTIVITY LIMITATIONS: carrying, lifting, bathing, toileting, dressing, reach over head, and hygiene/grooming  PARTICIPATION LIMITATIONS: meal prep, cleaning, laundry, driving, shopping, community activity, occupation, and yard work  PERSONAL FACTORS: Age, Education, Fitness, and 1 comorbidity: Gout are also affecting patient's functional outcome.   REHAB POTENTIAL: Excellent  CLINICAL DECISION MAKING: Stable/uncomplicated  EVALUATION COMPLEXITY: Low   GOALS: Goals reviewed with patient? No  SHORT TERM GOALS: Target date: 07/11/2023  Patient will demonstrate undestanding of home exercise plan by performing exercises  correctly with evidence of good carry over with min to no verbal or tactile cues .   Baseline: NT 07/11/23: Performing exercises independently  Goal status: ACHIEVED   LONG TERM GOALS: Target date: 12/13/2023  Patient will demonstrate a decrease of >=15 pts on QuickDash for right shoulder as evidence of improved right shoulder function.  Baseline: 84% or 48 pts total  08/01/23: 61% Goal status: ACHIEVED    2.  Patient will demonstrate right shoulder AROM that is nearly symmetrical (within 10% of L shoulder ROM) to left shoulder for improved right shoulder function to perform reaching and mouse manipulation tasks for his job.  Baseline: Shoulder Flex PROM R 45, Shoulder Abd R 45, AROM not performed at this point due to protocol  08/01/23: Right Shoulder Flex 90, Abd 50, IR 70, ER 20 09/12/23: Shoulder Flex R 145 (160 with  repeated motions), ER at 90 deg R 50 deg,  Shoulder Abd R 120  10/02/23: Shoulder Flex R 150, Abd R 145, ER at 90 R 50      Goal status: PARTIALLY MET      3.  Patient will demonstrate right shoulder MMT that is symmetrical to left shoulder for improved right shoulder function to perform reaching and mouse manipulation tasks for his job.  Baseline: MMT not performed yet  08/01/23: Shoulder Flex R 3-, Abd 3-, IR 3, ER 3-  08/28/23: Shoulder Flex R/L 4/4, Abd 4/4,   Goal status: ACHIEVED      4.  Patient will be able to return to performing upper extremity weight lifting routine with RUE as evidence of improved right shoulder function to maintain physical health.  Baseline: Unable to perform  08/20/23: Able perform exercises on RUE but unweighted    Goal status: ONGOING  5.  Patient will be able to return to using right shoulder for reaching, typing and manipulating mouse for work related tasks as evidence of improved right shoulder function. Baseline: Can perform some typing but not manipulation of mouse at moment  Goal status: ACHIEVED   PLAN:  PT FREQUENCY: 1-2x/week  PT DURATION: 12 weeks  PLANNED INTERVENTIONS: 97164- PT Re-evaluation, 97750- Physical Performance Testing, 97110-Therapeutic exercises, 97530- Therapeutic activity, [id number]- Neuromuscular re-education, 97535- Self Care, 02859- Manual therapy, [id number]- Aquatic Therapy, H9716- Electrical stimulation (unattended), [phone]- Electrical stimulation (manual), [id number]- Traction (mechanical), 20560 (1-2 muscles), 20561 (3+ muscles)- Dry Needling, Patient/Family education, Balance training, Taping, Joint mobilization, Joint manipulation, Spinal manipulation, Spinal mobilization, Cryotherapy, and Moist heat  PLAN FOR NEXT SESSION: Banded stars or ladders for shoulder strengthening. Bent over T's. Investigate press ups and incorporate tricep strengthening    Toribio Servant PT, DPT  [facility] [address], [place], [address] Phone: [phone]   Fax:  [phone]

## 2023-10-08 ENCOUNTER — Encounter: Admitting: Physical Therapy

## 2023-10-09 ENCOUNTER — Encounter: Admitting: Physical Therapy

## 2023-10-09 ENCOUNTER — Ambulatory Visit: Admitting: Primary Care

## 2023-10-09 ENCOUNTER — Ambulatory Visit: Payer: Self-pay | Admitting: Primary Care

## 2023-10-09 ENCOUNTER — Encounter: Payer: Self-pay | Admitting: Primary Care

## 2023-10-09 VITALS — BP 112/72 | HR 55 | Temp 97.8°F | Ht 70.0 in | Wt 214.0 lb

## 2023-10-09 DIAGNOSIS — E782 Mixed hyperlipidemia: Secondary | ICD-10-CM | POA: Diagnosis not present

## 2023-10-09 DIAGNOSIS — Z0000 Encounter for general adult medical examination without abnormal findings: Secondary | ICD-10-CM | POA: Diagnosis not present

## 2023-10-09 DIAGNOSIS — Z125 Encounter for screening for malignant neoplasm of prostate: Secondary | ICD-10-CM | POA: Diagnosis not present

## 2023-10-09 DIAGNOSIS — M1A9XX0 Chronic gout, unspecified, without tophus (tophi): Secondary | ICD-10-CM | POA: Diagnosis not present

## 2023-10-09 LAB — URIC ACID: Uric Acid, Serum: 4.2 mg/dL (ref 4.0–7.8)

## 2023-10-09 LAB — COMPREHENSIVE METABOLIC PANEL WITH GFR
ALT: 14 U/L (ref 0–53)
AST: 14 U/L (ref 0–37)
Albumin: 4.4 g/dL (ref 3.5–5.2)
Alkaline Phosphatase: 39 U/L (ref 39–117)
BUN: 26 mg/dL — ABNORMAL HIGH (ref 6–23)
CO2: 31 meq/L (ref 19–32)
Calcium: 9.3 mg/dL (ref 8.4–10.5)
Chloride: 100 meq/L (ref 96–112)
Creatinine, Ser: 1.16 mg/dL (ref 0.40–1.50)
GFR: 68.95 mL/min (ref >60.00)
Glucose, Bld: 83 mg/dL (ref 70–99)
Potassium: 4.2 meq/L (ref 3.5–5.1)
Sodium: 139 meq/L (ref 135–145)
Total Bilirubin: 0.7 mg/dL (ref 0.2–1.2)
Total Protein: 6.3 g/dL (ref 6.0–8.3)

## 2023-10-09 LAB — LIPID PANEL
Cholesterol: 211 mg/dL — ABNORMAL HIGH (ref 0–200)
HDL: 45.5 mg/dL (ref >39.00)
LDL Cholesterol: 140 mg/dL — ABNORMAL HIGH (ref 0–99)
NonHDL: 165.98
Total CHOL/HDL Ratio: 5
Triglycerides: 131 mg/dL (ref 0.0–149.0)
VLDL: 26.2 mg/dL (ref 0.0–40.0)

## 2023-10-09 LAB — PSA: PSA: 0.2 ng/mL (ref 0.10–4.00)

## 2023-10-09 NOTE — Assessment & Plan Note (Signed)
 Repeat lipid panel pending.

## 2023-10-09 NOTE — Patient Instructions (Signed)
 Stop by the lab prior to leaving today. I will notify you of your results once received.   It was a pleasure to see you today!

## 2023-10-09 NOTE — Progress Notes (Signed)
 Subjective:    Patient ID: Lonnie Castro, male    DOB: Feb 22, 1965, 58 y.o.   MRN: [id number]  Lonnie Castro is a very pleasant 58 y.o. male who presents today for complete physical and follow up of chronic conditions.  Immunizations: -Tetanus: Completed in 2016 -Influenza: Influenza vaccine provided today.  -Shingles: Completed Shingrix series  Diet: Fair diet.  Exercise: No regular exercise.  Eye exam: Completes annually  Dental exam: Completes every 3 months  Colonoscopy: Completed in 2024, due 2034  PSA: Due  BP Readings from Last 3 Encounters:  10/09/23 112/72  06/21/23 118/74  10/25/22 110/60         Review of Systems  Constitutional:  Negative for unexpected weight change.  HENT:  Negative for rhinorrhea.   Respiratory:  Negative for cough and shortness of breath.   Cardiovascular:  Negative for chest pain.  Gastrointestinal:  Negative for constipation and diarrhea.  Genitourinary:  Negative for difficulty urinating.  Musculoskeletal:  Negative for arthralgias and myalgias.  Skin:  Negative for rash.  Allergic/Immunologic: Negative for environmental allergies.  Neurological:  Negative for dizziness, numbness and headaches.  Psychiatric/Behavioral:  The patient is not nervous/anxious.          Past Medical History:  Diagnosis Date   Allergic rhinitis    Arthritis    Bronchitis 04/04/2021   Gout    Orchitis    Other fatigue 04/04/2021    Social History   Socioeconomic History   Marital status: Married    Spouse name: Not on file   Number of children: Not on file   Years of education: Not on file   Highest education level: Not on file  Occupational History   Not on file  Tobacco Use   Smoking status: Never   Smokeless tobacco: Never  Vaping Use   Vaping status: Never Used  Substance and Sexual Activity   Alcohol use: Yes    Alcohol/week: 4.0 standard drinks of alcohol    Types: 4 Standard drinks or equivalent per week    Comment:  social   Drug use: No   Sexual activity: Yes  Other Topics Concern   Not on file  Social History Narrative   Married.   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   Conservation officer, historic buildings.   Enjoys exercising, relaxing, flying, snowboarding.   Social Drivers of Corporate investment banker Strain: Not on file  Food Insecurity: Not on file  Transportation Needs: Not on file  Physical Activity: Not on file  Stress: Not on file  Social Connections: Not on file  Intimate Partner Violence: Not on file    Past Surgical History:  Procedure Laterality Date   COLONOSCOPY WITH PROPOFOL  N/A 11/03/2014   Procedure: COLONOSCOPY WITH PROPOFOL ;  Surgeon: Gladis RAYMOND Mariner, MD;  Location: [facility] END[facility]COPY;  Service: Endoscopy;  Laterality: N/A;   COLONOSCOPY WITH PROPOFOL  N/A 05/04/2022   Procedure: COLONOSCOPY WITH PROPOFOL ;  Surgeon: Unk Corinn Skiff, MD;  Location: [facility] ENDOSCOPY;  Service: Gastroenterology;  Laterality: N/A;   FRACTURE SURGERY     HERNIA REPAIR  01/02/1982   NASAL SEPTUM SURGERY  01/02/1985   SHOULDER ARTHROSCOPY WITH ROTATOR CUFF REPAIR Right 06/21/2023   Procedure: ARTHROSCOPY, SHOULDER, WITH ROTATOR CUFF REPAIR;  Surgeon: Cristy Bonner DASEN, MD;  Location: [facility];  Service: Orthopedics;  Laterality: Right;    Family History  Problem Relation Age of Onset   Cancer Father     No Known Allergies  Current  Outpatient Medications on File Prior to Visit  Medication Sig Dispense Refill   Colchicine  0.6 MG CAPS Take 2 tablets by mouth at gout onset, repeat with 1 pill 2 hours later, then take 1 tablet twice daily until flare resolves. 60 capsule 0   Febuxostat  80 MG TABS TAKE ONE TABLET BY MOUTH EVERY DAY FOR GOUT PREVENTION 90 tablet 1   fluticasone  (FLONASE ) 50 MCG/ACT nasal spray SPRAY 2 SPRAYS INTO EACH NOSTRIL EVERY DAY 48 mL 2   loratadine (CLARITIN) 10 MG tablet Take 10 mg by mouth daily.     No current facility-administered medications on file  prior to visit.    BP 112/72   Pulse (!) 55   Temp 97.8 F (36.6 C) (Temporal)   Ht 5' 10 (1.778 m)   Wt 214 lb (97.1 kg)   SpO2 95%   BMI 30.71 kg/m  Objective:   Physical Exam HENT:     Right Ear: Tympanic membrane and ear canal normal.     Left Ear: Tympanic membrane and ear canal normal.  Eyes:     Pupils: Pupils are equal, round, and reactive to light.  Cardiovascular:     Rate and Rhythm: Normal rate and regular rhythm.  Pulmonary:     Effort: Pulmonary effort is normal.     Breath sounds: Normal breath sounds.  Abdominal:     General: Bowel sounds are normal.     Palpations: Abdomen is soft.     Tenderness: There is no abdominal tenderness.  Musculoskeletal:        General: Normal range of motion.     Cervical back: Neck supple.  Skin:    General: Skin is warm and dry.  Neurological:     Mental Status: He is alert and oriented to person, place, and time.     Cranial Nerves: No cranial nerve deficit.     Deep Tendon Reflexes:     Reflex Scores:      Patellar reflexes are 2+ on the right side and 2+ on the left side. Psychiatric:        Mood and Affect: Mood normal.     Physical Exam        Assessment & Plan:  Preventative health care Assessment & Plan: Immunizations UTD. Influenza vaccine provided today.  Colonoscopy UTD, due 2034  PSA due and pending.  Discussed the importance of a healthy diet and regular exercise in order for weight loss, and to reduce the risk of further co-morbidity.  Exam stable. Labs pending.  Follow up in 1 year for repeat physical.    Mixed hyperlipidemia Assessment & Plan: Repeat lipid panel pending.     Orders: -     Lipid panel -     Comprehensive metabolic panel with GFR  Chronic gout without tophus, unspecified cause, unspecified site Assessment & Plan: No flares in years.  Continue febuxostat  80 mg daily for prevention.  Continue colchicine  0.6 mg PRN.  Repeat uric acid level  pending  Orders: -     Uric acid  Screening for prostate cancer -     PSA    Assessment and Plan Assessment & Plan         Comer MARLA Gaskins, NP   History of Present Illness

## 2023-10-09 NOTE — Assessment & Plan Note (Signed)
Immunizations UTD. Influenza vaccine provided today.  Colonoscopy UTD, due 2034 PSA due and pending.  Discussed the importance of a healthy diet and regular exercise in order for weight loss, and to reduce the risk of further co-morbidity.  Exam stable. Labs pending.  Follow up in 1 year for repeat physical.

## 2023-10-09 NOTE — Assessment & Plan Note (Signed)
 No flares in years.  Continue febuxostat  80 mg daily for prevention.  Continue colchicine  0.6 mg PRN.  Repeat uric acid level pending

## 2023-10-11 ENCOUNTER — Ambulatory Visit: Attending: Orthopaedic Surgery | Admitting: Physical Therapy

## 2023-10-11 DIAGNOSIS — M9902 Segmental and somatic dysfunction of thoracic region: Secondary | ICD-10-CM | POA: Diagnosis not present

## 2023-10-11 DIAGNOSIS — M9905 Segmental and somatic dysfunction of pelvic region: Secondary | ICD-10-CM | POA: Diagnosis not present

## 2023-10-11 DIAGNOSIS — M25511 Pain in right shoulder: Secondary | ICD-10-CM | POA: Insufficient documentation

## 2023-10-11 DIAGNOSIS — Z9889 Other specified postprocedural states: Secondary | ICD-10-CM | POA: Diagnosis present

## 2023-10-11 DIAGNOSIS — M9903 Segmental and somatic dysfunction of lumbar region: Secondary | ICD-10-CM | POA: Diagnosis not present

## 2023-10-11 DIAGNOSIS — M9901 Segmental and somatic dysfunction of cervical region: Secondary | ICD-10-CM | POA: Diagnosis not present

## 2023-10-11 NOTE — Therapy (Addendum)
 OUTPATIENT PHYSICAL THERAPY TREATMENT    Patient Name: Lonnie Castro MRN: [id number] DOB:21-Dec-1965, 58 y.o., male Today's Date: 10/11/2023  END OF SESSION:  PT End of Session - 10/11/23 1144     Visit Number 14    Number of Visits 25    Date for Recertification  12/13/23    Authorization Type [id number]    Authorization Time Period 5 approved  9/24-11/22    Authorization - Visit Number 2    Authorization - Number of Visits 5    Progress Note Due on Visit 17    PT Start Time 1115    PT Stop Time 1200    PT Time Calculation (min) 45 min    Activity Tolerance Patient tolerated treatment well;No increased pain    Behavior During Therapy [facility] for tasks assessed/performed                 Past Medical History:  Diagnosis Date   Allergic rhinitis    Arthritis    Bronchitis 04/04/2021   Gout    Orchitis    Other fatigue 04/04/2021   Past Surgical History:  Procedure Laterality Date   COLONOSCOPY WITH PROPOFOL  N/A 11/03/2014   Procedure: COLONOSCOPY WITH PROPOFOL ;  Surgeon: Gladis RAYMOND Mariner, MD;  Location: [facility] ENDOSCOPY;  Service: Endoscopy;  Laterality: N/A;   COLONOSCOPY WITH PROPOFOL  N/A 05/04/2022   Procedure: COLONOSCOPY WITH PROPOFOL ;  Surgeon: Unk Corinn Skiff, MD;  Location: [facility] ENDOSCOPY;  Service: Gastroenterology;  Laterality: N/A;   FRACTURE SURGERY     HERNIA REPAIR  01/02/1982   NASAL SEPTUM SURGERY  01/02/1985   SHOULDER ARTHROSCOPY WITH ROTATOR CUFF REPAIR Right 06/21/2023   Procedure: ARTHROSCOPY, SHOULDER, WITH ROTATOR CUFF REPAIR;  Surgeon: Cristy Bonner DASEN, MD;  Location: [facility];  Service: Orthopedics;  Laterality: Right;   Patient Active Problem List   Diagnosis Date Noted   Tinnitus 06/18/2015   Preventative health care 08/28/2014   Hyperlipidemia 08/28/2014   Eczema of external ear 08/28/2014   Chronic gout 05/25/2014   Rhinitis, allergic 05/25/2014    PCP: Comer Gaskins NP   REFERRING PROVIDER: Dr. Bonner Cristy    REFERRING DIAG: [phone] (ICD-10-CM) - S/P arthroscopy of right shoulder  THERAPY DIAG:  Acute pain of right shoulder  S/P arthroscopy of right shoulder  Rationale for Evaluation and Treatment: Rehabilitation  ONSET DATE: 06/21/23  SUBJECTIVE:                                                                                                                                                                                      SUBJECTIVE STATEMENT: Pt reports that pain is well controlled and  that the only issues he is having is doing shoulder presses and front raises and lateral raises. 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 His pain is well controlled and he presents with right arm in sling. He wants to return to lifting at gym and using his RUE for job related tasks like typing and moving mouse around.   PAIN:  Are you having pain?  No real pain at rest today   PRECAUTIONS: Shoulder -No AAROM or AROM of right shoulder until week 5 and/or cleared by surgeon   WEIGHT BEARING RESTRICTIONS:  No weight bearing through RUE    FALLS:  Has patient fallen in last 6 months? No  LIVING ENVIRONMENT: Lives with: lives with their spouse Lives in: House/apartment Stairs: Did not ask  Has following equipment at home: Did not ask   OCCUPATION: Psychologist, sport and exercise    PLOF: Independent  PATIENT GOALS: Wants to return to gym for UE exercise.   NEXT MD VISIT: Friday, September 12th 2025   OBJECTIVE:  Note: Objective measures were completed at Evaluation unless otherwise noted.  PATIENT SURVEYS:  Quick Dash:   Please rate your ability do the following activities in the last week by selecting the number below the appropriate response.   Activities Rating  Open a tight or new jar.  5 = Unable  Do heavy household  chores (e.g., wash walls, floors). 5 = Unable  Carry a shopping bag or briefcase 5 = Unable  Wash your back. 5 = Unable  Use a knife to cut food. 5 = Unable  Recreational activities in which you take some force or impact through your arm, shoulder or hand (e.g., golf, hammering, tennis, etc.). 5 = Unable  During the past week, to what extent has your arm, shoulder or hand problem interfered with your normal social activities with family, friends, neighbors or groups?  5 = Extremely  During the past week, were you limited in your work or other regular daily activities as a result of your arm, shoulder or hand problem? 5 = Unable  During the past week, were you limited in your work or other regular daily activities as a result of your arm, shoulder or hand problem? 4 = Severe  Tingling (pins and needles) in your arm, shoulder or hand. 2 = Mild  During the past week, how much difficulty have you had sleeping because of the pain in your arm, shoulder or hand?  2 = Mild difficulty   (A QuickDASH score may not be calculated if there is greater than 1 missing item.)  Quick Dash Disability/Symptom Score: [(sum of  (48) responses/11 (n)] -1] x 25 = 84%  Minimally Clinically Important Difference (MCID): 15-20 points  (Franchignoni, F. et al. (2013). Minimally clinically important difference of the disabilities of the arm, shoulder, and hand outcome measures (DASH) and its shortened version (Quick DASH). Journal of Orthopaedic & Sports Physical Therapy, 44(1), 30-39)   COGNITION: Overall cognitive status: Within functional limits for tasks assessed     SENSATION: Not tested  POSTURE: No postural deficits noted   UPPER EXTREMITY ROM:   Active/Passive ROM Right eval Left eval Right   08/08/23 Right  08/13/23 Right  08/20/23 Left  08/20/23 Right   08/28/23 Right 09/12/23 Right   10/02/23   Right  10/11/23  Shoulder flexion NT/45 deg 180 115/ NT  117/125 130/NT 180 150/155 140 150   157/160*   Shoulder extension            Shoulder abduction NT/45 deg  65* 65/70  70/NT  170 75/NT 120 147  147/150*  Shoulder adduction     30/NT 70 30 50 50/55 57/60*  Shoulder ER at 90 deg Abd      70/NT 90      Shoulder ER at 60 deg Abd    30/NT 25/NT          Shoulder IR at 60 deg Abd     30/NT          Shoulder internal rotation            Shoulder external rotation            Elbow flexion NT/90           Elbow extension NT/0           Wrist flexion            Wrist extension            Wrist ulnar deviation            Wrist radial deviation            Wrist pronation            Wrist supination            (Blank rows = not tested)                                  (Blank rows = not tested)        UPPER EXTREMITY MMT:  MMT Right eval Left eval Right  08/08/23 Left  08/08/23 Right  08/20/23 Right  08/28/23 Left  08/28/23 Right     Shoulder flexion 3-* 5 4- 4 4- 4+ 5 5  Shoulder extension          Shoulder abduction 3-* 5 3+ 4 4- 4 5 5   Shoulder adduction          Shoulder internal rotation 3 5 4- 4 4- 4 4+ 4+  Shoulder external rotation 3-* 5 4- 4 4- 4 4+ 4+  Shoulder internal rotation at 90 deg        4+  Shoulder external rotation at 90 deg          4+                                                                                                    TREATMENT DATE   10/11/23: THEREX  UBE 2.5 min forward and 2.5 min backwards at resistance of 3   Shoulder A/PROM measurements (see above) Education about why patient is limited in performing overhead press because of limitations with shoulder external rotation.  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  -Based on RTC protocol, PT recommended that he could pursue this in week 20, which is  the return to sport phase barring no increased pain  Right Shoulder Flexion static stretch at terminal range of motion in supine with pillow supporting shoulder and #1 DB for low load resistance 5 min   -NRPS  3-4/10   NMR  Lower Trap Setting Lift Off on LUE  1 x 10   -multimodal cueing for muscle activation and to perform at increased flexion  D2 PNF Flexion/ Extension on LUE  with mirror present for visual feedback 2 x 10   -mod VC to engage scapular retractors to avoid over activation of upper trap       PATIENT EDUCATION: Education details:  Form and technique for correct performance of exercise and explanation about post op protocol  Person educated: Patient Education method: Explanation, Demonstration, and Verbal cues Education comprehension: verbalized understanding, returned demonstration, and verbal cues required  HOME EXERCISE PROGRAM: Access Code: [email or web address] URL: [email or web address] Date: 10/11/2023 Prepared by: Toribio Servant  Exercises - Seated Cervical Sidebending Stretch  - 1 x daily - 7 x weekly - 3 reps - 30-60 sec  hold - Standing Shoulder Scaption Wall Walk  - 1 x daily - 7 x weekly - 2 sets - 10 reps - Standing Shoulder Flexion Wall Walk  - 1 x daily - 7 x weekly - 2 sets - 10 reps - Standing Shoulder Abduction Wall Slide   - 1 x daily - 7 x weekly - 2 sets - 10 reps - Bent Over Single Arm Shoulder Row with Dumbbell  - 3-4 x weekly - 3 sets - 10 reps - Sidelying Shoulder External Rotation  - 2 x daily - 5-7 x weekly - 3 sets - 20 reps - Serratus Activation at Wall with Foam Roll  - 3-4 x weekly - 3 sets - 10 reps - Single Arm Bent Over Shoulder Horizontal Abduction with Dumbbell - Palm Down  - 3-4 x weekly - 3 sets - 10 reps - Single Arm Low Trap Setting at Wall  - 5-7 x weekly - 3 sets - 20 reps - Shoulder PNF D2 Flexion (Mirrored)  - 5-7 x weekly - 3 sets - 20 reps - Single Arm Doorway Pec Stretch at 90 Degrees Abduction  - 1 x daily - 7 x  weekly - 3 reps - 30 sec hold - Single Arm Doorway Pec Stretch at 120 Degrees Abduction  - 1 x daily - 7 x weekly - 3 reps - 30-60 sec hold - Seated Shoulder External Rotation PROM on Table (Mirrored)  - 1 x daily - 7 x weekly - 3 reps - 30-60 sec  hold - Standing Shoulder Abduction with Bent Elbow  - 3-4 x weekly - 3 sets - 10 reps - Supine shoulder flexion stretch at end range  - 1 x daily - 7 x weekly - 1-2 reps - 10 min   hold - Shoulder Flexion with bent elbow    - 3-4 x weekly - 3 sets - 10 reps  ASSESSMENT:  CLINICAL IMPRESSION: Pt is s/p 16 weeks for right shoulder arthroscopy with RTC debridment, AC decompression, SLAP repair, and biceps tenodesis. He continues to right shoulder limitations with firm end field at flexion, abduction, and external rotation along  with pain. PT focused on therex focused on passive low load stretch of left shoulder tissue at flexion end range for improved mobility. Pt was able to tolerate position without exceeding 5/10 NRPS and he will attempt for exercise at home. Because of left shoulder mobility deficits, pt is unable to perform overhead press with limitations in shoulder external rotation. Discussed modifications like incline press to strength muscles without limitations. PT also focused on lower trap activation with multimodal cueing to decrease upper trap over activation. Pt able to perform but still relies heavily on right upper trap. He will be away on trip for next 3 weeks and he has been given updated HEP to continue to address ROM, neuromusclar, and strength deficits in his right shoulder and he will continue to benefit from skilled PT to address these issues to return to lifting object onto shelf overhead.     He shows improved right shoulder strength and ROM. He continues to have pain at end range external rotation and flexion with firm end field. He also exhibits increased shoulder elevation with upper trap activation as compensation for overhead  shoulder movement. He will continue to benefit from skilled PT to increase his right shoulder mobility and strength for improved shoulder function.    OBJECTIVE IMPAIRMENTS: decreased knowledge of condition, decreased ROM, decreased strength, increased muscle spasms, impaired UE functional use, and pain.   ACTIVITY LIMITATIONS: carrying, lifting, bathing, toileting, dressing, reach over head, and hygiene/grooming  PARTICIPATION LIMITATIONS: meal prep, cleaning, laundry, driving, shopping, community activity, occupation, and yard work  PERSONAL FACTORS: Age, Education, Fitness, and 1 comorbidity: Gout are also affecting patient's functional outcome.   REHAB POTENTIAL: Excellent  CLINICAL DECISION MAKING: Stable/uncomplicated  EVALUATION COMPLEXITY: Low   GOALS: Goals reviewed with patient? No  SHORT TERM GOALS: Target date: 07/11/2023  Patient will demonstrate undestanding of home exercise plan by performing exercises correctly with evidence of good carry over with min to no verbal or tactile cues .   Baseline: NT 07/11/23: Performing exercises independently  Goal status: ACHIEVED   LONG TERM GOALS: Target date: 12/13/2023  Patient will demonstrate a decrease of >=15 pts on QuickDash for right shoulder as evidence of improved right shoulder function.  Baseline: 84% or 48 pts total  08/01/23: 61% Goal status: ACHIEVED    2.  Patient will demonstrate right shoulder AROM that is nearly symmetrical (within 10% of L shoulder ROM) to left shoulder for improved right shoulder function to perform reaching and mouse manipulation tasks for his job.  Baseline: Shoulder Flex PROM R 45, Shoulder Abd R 45, AROM not performed at this point due to protocol  08/01/23: Right Shoulder Flex 90, Abd 50, IR 70, ER 20 09/12/23: Shoulder Flex R 145 (160 with repeated motions), ER at 90 deg R 50 deg,  Shoulder Abd R 120  10/02/23: Shoulder Flex R 150, Abd R 145, ER at 90 R 50      Goal status: PARTIALLY MET       3.  Patient will demonstrate right shoulder MMT that is symmetrical to left shoulder for improved right shoulder function to perform reaching and mouse manipulation tasks for his job.  Baseline: MMT not performed yet  08/01/23: Shoulder Flex R 3-, Abd 3-, IR 3, ER 3-  08/28/23: Shoulder Flex R/L 4/4, Abd 4/4,   Goal status: ACHIEVED      4.  Patient will be able to return to performing upper extremity weight lifting routine with RUE as evidence of improved right  shoulder function to maintain physical health.  Baseline: Unable to perform  08/20/23: Able perform exercises on RUE but unweighted    Goal status: ONGOING  5.  Patient will be able to return to using right shoulder for reaching, typing and manipulating mouse for work related tasks as evidence of improved right shoulder function. Baseline: Can perform some typing but not manipulation of mouse at moment  Goal status: ACHIEVED   PLAN:  PT FREQUENCY: 1-2x/week  PT DURATION: 12 weeks  PLANNED INTERVENTIONS: [id number]- PT Re-evaluation, 97750- Physical Performance Testing, 97110-Therapeutic exercises, 97530- Therapeutic activity, [id number]- Neuromuscular re-education, 97535- Self Care, [id number]- Manual therapy, [id number]- Aquatic Therapy, [id number]- Electrical stimulation (unattended), [phone]- Electrical stimulation (manual), [id number]- Traction (mechanical), 20560 (1-2 muscles), 20561 (3+ muscles)- Dry Needling, Patient/Family education, Balance training, Taping, Joint mobilization, Joint manipulation, Spinal manipulation, Spinal mobilization, Cryotherapy, and Moist heat  PLAN FOR NEXT SESSION: Review home exercise plan and long term goals.   Toribio Servant PT, DPT  [facility] [address], [place], [address] Phone: [phone]   Fax:  [phone]

## 2023-10-15 ENCOUNTER — Encounter: Admitting: Physical Therapy

## 2023-10-18 ENCOUNTER — Encounter: Admitting: Physical Therapy

## 2023-10-23 ENCOUNTER — Encounter: Admitting: Physical Therapy

## 2023-10-25 ENCOUNTER — Encounter: Admitting: Physical Therapy

## 2023-10-30 ENCOUNTER — Encounter: Admitting: Physical Therapy

## 2023-11-01 ENCOUNTER — Encounter: Admitting: Physical Therapy

## 2023-11-05 ENCOUNTER — Encounter: Payer: Self-pay | Admitting: Physical Therapy

## 2023-11-05 ENCOUNTER — Ambulatory Visit
Admission: RE | Admit: 2023-11-05 | Discharge: 2023-11-05 | Disposition: A | Discharge status: Home / Self Care | Payer: Self-pay | Source: Ambulatory Visit | Attending: Primary Care | Admitting: Primary Care

## 2023-11-05 ENCOUNTER — Ambulatory Visit: Attending: Orthopaedic Surgery

## 2023-11-05 DIAGNOSIS — Z9889 Other specified postprocedural states: Secondary | ICD-10-CM | POA: Insufficient documentation

## 2023-11-05 DIAGNOSIS — E782 Mixed hyperlipidemia: Secondary | ICD-10-CM | POA: Insufficient documentation

## 2023-11-05 DIAGNOSIS — M25511 Pain in right shoulder: Secondary | ICD-10-CM | POA: Diagnosis present

## 2023-11-05 NOTE — Therapy (Signed)
 OUTPATIENT PHYSICAL THERAPY TREATMENT    Patient Name: Lonnie Castro MRN: [id number] DOB:Nov 24, 1965, 58 y.o., male Today's Date: 11/05/2023  END OF SESSION:  PT End of Session - 11/05/23 1117     Visit Number 15    Number of Visits 25    Date for Recertification  12/13/23    Authorization Type [id number]    Authorization Time Period 5 approved  9/24-11/22    Authorization - Number of Visits 5    Progress Note Due on Visit 17    PT Start Time 1116    PT Stop Time 1135    PT Time Calculation (min) 19 min    Activity Tolerance Patient tolerated treatment well;No increased pain    Behavior During Therapy [facility] for tasks assessed/performed                  Past Medical History:  Diagnosis Date   Allergic rhinitis    Arthritis    Bronchitis 04/04/2021   Gout    Orchitis    Other fatigue 04/04/2021   Past Surgical History:  Procedure Laterality Date   COLONOSCOPY WITH PROPOFOL  N/A 11/03/2014   Procedure: COLONOSCOPY WITH PROPOFOL ;  Surgeon: Gladis RAYMOND Mariner, MD;  Location: [facility] ENDOSCOPY;  Service: Endoscopy;  Laterality: N/A;   COLONOSCOPY WITH PROPOFOL  N/A 05/04/2022   Procedure: COLONOSCOPY WITH PROPOFOL ;  Surgeon: Unk Corinn Skiff, MD;  Location: [facility] ENDOSCOPY;  Service: Gastroenterology;  Laterality: N/A;   FRACTURE SURGERY     HERNIA REPAIR  01/02/1982   NASAL SEPTUM SURGERY  01/02/1985   SHOULDER ARTHROSCOPY WITH ROTATOR CUFF REPAIR Right 06/21/2023   Procedure: ARTHROSCOPY, SHOULDER, WITH ROTATOR CUFF REPAIR;  Surgeon: Cristy Bonner DASEN, MD;  Location: [facility];  Service: Orthopedics;  Laterality: Right;   Patient Active Problem List   Diagnosis Date Noted   Tinnitus 06/18/2015   Preventative health care 08/28/2014   Hyperlipidemia 08/28/2014   Eczema of external ear 08/28/2014   Chronic gout 05/25/2014   Rhinitis, allergic 05/25/2014    PCP: Comer Gaskins NP   REFERRING PROVIDER: Dr. Bonner Cristy   REFERRING DIAG: [phone] (ICD-10-CM) -  S/P arthroscopy of right shoulder  THERAPY DIAG:  S/P arthroscopy of right shoulder  Acute pain of right shoulder  Rationale for Evaluation and Treatment: Rehabilitation  ONSET DATE: 06/21/23  SUBJECTIVE:                                                                                                                                                                                      SUBJECTIVE STATEMENT:   Patient reports he was in [facility] for a week and going to  the gym consistently. He felt burning sensation and saw the MD which he said there was nothing wrong.    PERTINENT HISTORY: Pt referred to therapy following right shoulder arthroscopy with a/c joint decompression, RTC debridement, biceps tenodesis and SLAP repair. His pain is well controlled and he presents with right arm in sling. He wants to return to lifting at gym and using his RUE for job related tasks like typing and moving mouse around.   PAIN:  Are you having pain?  No real pain at rest today   PRECAUTIONS: Shoulder -No AAROM or AROM of right shoulder until week 5 and/or cleared by surgeon   WEIGHT BEARING RESTRICTIONS:  No weight bearing through RUE    FALLS:  Has patient fallen in last 6 months? No  LIVING ENVIRONMENT: Lives with: lives with their spouse Lives in: House/apartment Stairs: Did not ask  Has following equipment at home: Did not ask   OCCUPATION: Psychologist, sport and exercise    PLOF: Independent  PATIENT GOALS: Wants to return to gym for UE exercise.   NEXT MD VISIT: Friday, September 12th 2025   OBJECTIVE:  Note: Objective measures were completed at Evaluation unless otherwise noted.  PATIENT SURVEYS:  Quick Dash:   Please rate your ability do the following activities in the last week by selecting the number below the appropriate response.   Activities Rating  Open a tight or new jar.  5 = Unable  Do heavy household chores (e.g., wash walls, floors). 5 = Unable  Carry a shopping bag or  briefcase 5 = Unable  Wash your back. 5 = Unable  Use a knife to cut food. 5 = Unable  Recreational activities in which you take some force or impact through your arm, shoulder or hand (e.g., golf, hammering, tennis, etc.). 5 = Unable  During the past week, to what extent has your arm, shoulder or hand problem interfered with your normal social activities with family, friends, neighbors or groups?  5 = Extremely  During the past week, were you limited in your work or other regular daily activities as a result of your arm, shoulder or hand problem? 5 = Unable  During the past week, were you limited in your work or other regular daily activities as a result of your arm, shoulder or hand problem? 4 = Severe  Tingling (pins and needles) in your arm, shoulder or hand. 2 = Mild  During the past week, how much difficulty have you had sleeping because of the pain in your arm, shoulder or hand?  2 = Mild difficulty   (A QuickDASH score may not be calculated if there is greater than 1 missing item.)  Quick Dash Disability/Symptom Score: [(sum of  (48) responses/11 (n)] -1] x 25 = 84%  Minimally Clinically Important Difference (MCID): 15-20 points  (Franchignoni, F. et al. (2013). Minimally clinically important difference of the disabilities of the arm, shoulder, and hand outcome measures (DASH) and its shortened version (Quick DASH). Journal of Orthopaedic & Sports Physical Therapy, 44(1), 30-39)   COGNITION: Overall cognitive status: Within functional limits for tasks assessed     SENSATION: Not tested  POSTURE: No postural deficits noted   UPPER EXTREMITY ROM:   Active/Passive ROM Right eval Left eval Right   08/08/23 Right  08/13/23 Right  08/20/23 Left  08/20/23 Right   08/28/23 Right 09/12/23 Right   10/02/23   Right  10/11/23    Shoulder flexion NT/45 deg 180 115/ NT  117/125 130/NT 180 150/155  140 150   157/160*  Shoulder extension            Shoulder abduction NT/45 deg  65* 65/70   70/NT  170 75/NT 120 147  147/150*  Shoulder adduction     30/NT 70 30 50 50/55 57/60*  Shoulder ER at 90 deg Abd      70/NT 90      Shoulder ER at 60 deg Abd    30/NT 25/NT          Shoulder IR at 60 deg Abd     30/NT          Shoulder internal rotation            Shoulder external rotation            Elbow flexion NT/90           Elbow extension NT/0           Wrist flexion            Wrist extension            Wrist ulnar deviation            Wrist radial deviation            Wrist pronation            Wrist supination            (Blank rows = not tested)                                  (Blank rows = not tested)        UPPER EXTREMITY MMT:  MMT Right eval Left eval Right  08/08/23 Left  08/08/23 Right  08/20/23 Right  08/28/23 Left  08/28/23 Right     Shoulder flexion 3-* 5 4- 4 4- 4+ 5 5  Shoulder extension          Shoulder abduction 3-* 5 3+ 4 4- 4 5 5   Shoulder adduction          Shoulder internal rotation 3 5 4- 4 4- 4 4+ 4+  Shoulder external rotation 3-* 5 4- 4 4- 4 4+ 4+  Shoulder internal rotation at 90 deg        4+  Shoulder external rotation at 90 deg          4+                                                                                                    TREATMENT DATE   11/05/23:  UBE 2.5 min fwd/2.5 min bwd at resistance of 8 Reviewed goals, see goal section - discharged  10/11/23: THEREX  UBE 2.5 min forward and 2.5 min backwards at resistance of 3   Shoulder A/PROM measurements (see above) Education about why patient is limited in performing overhead press because of limitations with shoulder external rotation.  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  -Based on RTC protocol, PT recommended that he could  pursue this in week 20, which is the return to sport phase barring no increased pain  Right Shoulder Flexion static stretch at terminal range of motion in supine with pillow supporting shoulder and #1 DB for low load resistance 5 min   -NRPS  3-4/10   NMR  Lower Trap Setting Lift Off on LUE  1 x 10   -multimodal cueing for muscle activation and to perform at increased flexion  D2 PNF Flexion/ Extension on LUE  with mirror present for visual feedback 2 x 10   -mod VC to engage scapular retractors to avoid over activation of upper trap     PATIENT EDUCATION: Education details:  Form and technique for correct performance of exercise and explanation about post op protocol  Person educated: Patient Education method: Explanation, Demonstration, and Verbal cues Education comprehension: verbalized understanding, returned demonstration, and verbal cues required  HOME EXERCISE PROGRAM: Access Code: [email or web address] URL: [email or web address] Date: 10/11/2023 Prepared by: Toribio Servant  Exercises - Seated Cervical Sidebending Stretch  - 1 x daily - 7 x weekly - 3 reps - 30-60 sec  hold - Standing Shoulder Scaption Wall Walk  - 1 x daily - 7 x weekly - 2 sets - 10 reps - Standing Shoulder Flexion Wall Walk  - 1 x daily - 7 x weekly - 2 sets - 10 reps - Standing Shoulder Abduction Wall Slide   - 1 x daily - 7 x weekly - 2 sets - 10 reps - Bent Over Single Arm Shoulder Row with Dumbbell  - 3-4 x weekly - 3 sets - 10 reps - Sidelying Shoulder External Rotation  - 2 x daily - 5-7 x weekly - 3 sets - 20 reps - Serratus Activation at Wall with Foam Roll  - 3-4 x weekly - 3 sets - 10 reps - Single Arm Bent Over Shoulder Horizontal Abduction with Dumbbell - Palm Down  - 3-4 x weekly - 3 sets - 10 reps - Single Arm Low Trap Setting at Wall  - 5-7 x weekly - 3 sets - 20 reps - Shoulder PNF D2 Flexion (Mirrored)  - 5-7 x weekly - 3 sets - 20 reps - Single Arm Doorway Pec Stretch at 90 Degrees  Abduction  - 1 x daily - 7 x weekly - 3 reps - 30 sec hold - Single Arm Doorway Pec Stretch at 120 Degrees Abduction  - 1 x daily - 7 x weekly - 3 reps - 30-60 sec hold - Seated Shoulder External Rotation PROM on Table (Mirrored)  - 1 x daily - 7 x weekly - 3 reps - 30-60 sec  hold - Standing Shoulder Abduction with Bent Elbow  - 3-4 x weekly - 3 sets - 10 reps - Supine shoulder flexion stretch at end range  - 1 x daily - 7 x weekly - 1-2 reps - 10 min   hold - Shoulder Flexion with bent elbow    - 3-4 x weekly - 3 sets - 10 reps  ASSESSMENT:  CLINICAL IMPRESSION: Patient has made significant progress towards goals and has adequately met all goals. AROM continues to improve. Patient is independent with HEP as well as gym based exercises consistently. Patient  in agreement with discharge this date. Printed off HEP as patient has difficulty accessing online. Patient to be discharged from skilled PT services to independent participation of HEP and gym based exercises.    OBJECTIVE IMPAIRMENTS: decreased knowledge of condition, decreased ROM, decreased strength, increased muscle spasms, impaired UE functional use, and pain.   ACTIVITY LIMITATIONS: carrying, lifting, bathing, toileting, dressing, reach over head, and hygiene/grooming  PARTICIPATION LIMITATIONS: meal prep, cleaning, laundry, driving, shopping, community activity, occupation, and yard work  PERSONAL FACTORS: Age, Education, Fitness, and 1 comorbidity: Gout are also affecting patient's functional outcome.   REHAB POTENTIAL: Excellent  CLINICAL DECISION MAKING: Stable/uncomplicated  EVALUATION COMPLEXITY: Low   GOALS: Goals reviewed with patient? No  SHORT TERM GOALS: Target date: 07/11/2023  Patient will demonstrate undestanding of home exercise plan by performing exercises correctly with evidence of good carry over with min to no verbal or tactile cues .   Baseline: NT 07/11/23: Performing exercises independently  Goal status:  ACHIEVED   LONG TERM GOALS: Target date: 12/13/2023  Patient will demonstrate a decrease of >=15 pts on QuickDash for right shoulder as evidence of improved right shoulder function.  Baseline: 84% or 48 pts total  08/01/23: 61% Goal status: ACHIEVED    2.  Patient will demonstrate right shoulder AROM that is nearly symmetrical (within 10% of L shoulder ROM) to left shoulder for improved right shoulder function to perform reaching and mouse manipulation tasks for his job.  Baseline: Shoulder Flex PROM R 45, Shoulder Abd R 45, AROM not performed at this point due to protocol  08/01/23: Right Shoulder Flex 90, Abd 50, IR 70, ER 20 09/12/23: Shoulder Flex R 145 (160 with repeated motions), ER at 90 deg R 50 deg,  Shoulder Abd R 120  10/02/23: Shoulder Flex R 150, Abd R 145, ER at 90 R 50; 11/05/23: Shoulder Flex R 153, Abd R 150, ER at 90 R 55 Goal status: PARTIALLY MET      3.  Patient will demonstrate right shoulder MMT that is symmetrical to left shoulder for improved right shoulder function to perform reaching and mouse manipulation tasks for his job.  Baseline: MMT not performed yet  08/01/23: Shoulder Flex R 3-, Abd 3-, IR 3, ER 3-  08/28/23: Shoulder Flex R/L 4/4, Abd 4/4,   Goal status: ACHIEVED      4.  Patient will be able to return to performing upper extremity weight lifting routine with RUE as evidence of improved right shoulder function to maintain physical health.  Baseline: Unable to perform  08/20/23: Able perform exercises on RUE but unweighted  ; 11/05/23: able to complete weightlifting routine in gym   Goal status: ACHIEVED   5.  Patient will be able to return to using right shoulder for reaching, typing and manipulating mouse for work related tasks as evidence of improved right shoulder function. Baseline: Can perform some typing but not manipulation of mouse at moment  Goal status: ACHIEVED   PLAN:  PT FREQUENCY: 1-2x/week  PT DURATION: 12 weeks  PLANNED INTERVENTIONS: 97164-  PT Re-evaluation, 97750- Physical Performance Testing, 97110-Therapeutic exercises, 97530- Therapeutic activity, 97112- Neuromuscular re-education, 97535- Self Care, 02859- Manual therapy, [phone]- Aquatic Therapy, G0283- Electrical stimulation (unattended), [phone]- Electrical stimulation (manual), [id number]- Traction (mechanical), 20560 (1-2 muscles), 20561 (3+ muscles)- Dry Needling, Patient/Family education, Balance training, Taping, Joint mobilization, Joint manipulation, Spinal manipulation, Spinal mobilization, Cryotherapy, and Moist heat    Maryanne Finder, PT, DPT Physical Therapist - [facility]  [facility]

## 2023-11-06 ENCOUNTER — Ambulatory Visit: Payer: Self-pay | Admitting: Primary Care

## 2023-11-06 ENCOUNTER — Encounter: Admitting: Physical Therapy

## 2023-11-06 DIAGNOSIS — E782 Mixed hyperlipidemia: Secondary | ICD-10-CM

## 2023-11-08 ENCOUNTER — Encounter: Admitting: Physical Therapy

## 2023-11-15 ENCOUNTER — Encounter: Admitting: Physical Therapy

## 2023-11-20 ENCOUNTER — Encounter: Admitting: Physical Therapy

## 2023-12-25 ENCOUNTER — Ambulatory Visit: Admitting: Family Medicine

## 2023-12-25 ENCOUNTER — Encounter: Payer: Self-pay | Admitting: Family Medicine

## 2023-12-25 VITALS — BP 114/76 | HR 46 | Temp 98.3°F | Ht 70.0 in | Wt 216.0 lb

## 2023-12-25 DIAGNOSIS — J069 Acute upper respiratory infection, unspecified: Secondary | ICD-10-CM | POA: Diagnosis not present

## 2023-12-25 MED ORDER — ALBUTEROL SULFATE HFA 108 (90 BASE) MCG/ACT IN AERS: 2.0000 | INHALATION_SPRAY | RESPIRATORY_TRACT | 1 refills | Status: AC | PRN

## 2023-12-25 MED ORDER — PREDNISONE 10 MG PO TABS: ORAL_TABLET | ORAL | 0 refills | Status: AC

## 2023-12-25 NOTE — Progress Notes (Signed)
 "  Subjective:    Patient ID: Lonnie Castro, male    DOB: Jun 02, 1965, 58 y.o.   MRN: [id number]  HPI  Wt Readings from Last 3 Encounters:  12/25/23 216 lb (98 kg)  10/09/23 214 lb (97.1 kg)  06/21/23 211 lb 6.7 oz (95.9 kg)   30.99 kg/m  Vitals:   12/25/23 1156  BP: 114/76  Pulse: (!) 46  Temp: 98.3 F (36.8 C)  SpO2: 97%    58 yo pt of NP Clark presents for  Uri symptoms   4-5 days  Negative covid test at home  Wants to be tested for RSV  Chest congestion  Cough - some rattle Not productive A little wheezing   No fever  No chills/body aches Normal ears and throat   Taking  Primetine mist  Delsym  Guaifenesin   Barky cough   Has small children in house- wants to test for rsv      Patient Active Problem List   Diagnosis Date Noted   Viral URI with cough 12/25/2023   Tinnitus 06/18/2015   Preventative health care 08/28/2014   Hyperlipidemia 08/28/2014   Eczema of external ear 08/28/2014   Chronic gout 05/25/2014   Rhinitis, allergic 05/25/2014   Past Medical History:  Diagnosis Date   Allergic rhinitis    Arthritis    Bronchitis 04/04/2021   Gout    Orchitis    Other fatigue 04/04/2021   Past Surgical History:  Procedure Laterality Date   COLONOSCOPY WITH PROPOFOL  N/A 11/03/2014   Procedure: COLONOSCOPY WITH PROPOFOL ;  Surgeon: Gladis RAYMOND Mariner, MD;  Location: [facility] ENDOSCOPY;  Service: Endoscopy;  Laterality: N/A;   COLONOSCOPY WITH PROPOFOL  N/A 05/04/2022   Procedure: COLONOSCOPY WITH PROPOFOL ;  Surgeon: Unk Corinn Skiff, MD;  Location: [facility] ENDOSCOPY;  Service: Gastroenterology;  Laterality: N/A;   FRACTURE SURGERY     HERNIA REPAIR  01/02/1982   NASAL SEPTUM SURGERY  01/02/1985   SHOULDER ARTHROSCOPY WITH ROTATOR CUFF REPAIR Right 06/21/2023   Procedure: ARTHROSCOPY, SHOULDER, WITH ROTATOR CUFF REPAIR;  Surgeon: Cristy Bonner DASEN, MD;  Location: [facility];  Service: Orthopedics;  Laterality: Right;   Social  History[1] Family History  Problem Relation Age of Onset   Cancer Father    Allergies[2] Medications Ordered Prior to Encounter[3]  Review of Systems  Constitutional:  Positive for fatigue. Negative for appetite change and fever.  HENT:  Positive for postnasal drip. Negative for congestion, ear pain, rhinorrhea, sinus pressure, sneezing and sore throat.   Eyes:  Negative for pain and discharge.  Respiratory:  Positive for cough and wheezing. Negative for shortness of breath and stridor.   Cardiovascular:  Negative for chest pain.  Gastrointestinal:  Negative for diarrhea, nausea and vomiting.  Genitourinary:  Negative for frequency, hematuria and urgency.  Musculoskeletal:  Negative for arthralgias and myalgias.  Skin:  Negative for rash.  Neurological:  Negative for dizziness, weakness, light-headedness and headaches.  Psychiatric/Behavioral:  Negative for confusion and dysphoric mood.        Objective:   Physical Exam Constitutional:      General: He is not in acute distress.    Appearance: Normal appearance. He is well-developed. He is obese. He is not ill-appearing, toxic-appearing or diaphoretic.  HENT:     Head: Normocephalic and atraumatic.     Comments: No sinus tenderness     Right Ear: Tympanic membrane, ear canal and external ear normal.     Left Ear: Tympanic membrane, ear canal and  external ear normal.     Nose: No congestion.     Comments: Boggy nares    Mouth/Throat:     Mouth: Mucous membranes are moist.     Pharynx: Oropharynx is clear. No oropharyngeal exudate or posterior oropharyngeal erythema.     Comments: Clear pnd   Not hoarse Eyes:     General:        Right eye: No discharge.        Left eye: No discharge.     Conjunctiva/sclera: Conjunctivae normal.     Pupils: Pupils are equal, round, and reactive to light.  Cardiovascular:     Rate and Rhythm: Normal rate.     Heart sounds: Normal heart sounds.  Pulmonary:     Effort: Pulmonary effort is  normal. No respiratory distress.     Breath sounds: No stridor. Wheezing and rhonchi present. No rales.     Comments: Scattered rhonchi End exp wheezes   No stridor  Chest:     Chest wall: No tenderness.  Musculoskeletal:     Cervical back: Normal range of motion and neck supple.  Lymphadenopathy:     Cervical: No cervical adenopathy.  Skin:    General: Skin is warm and dry.     Capillary Refill: Capillary refill takes less than 2 seconds.     Findings: No rash.  Neurological:     Mental Status: He is alert.     Cranial Nerves: No cranial nerve deficit.  Psychiatric:        Mood and Affect: Mood normal.           Assessment & Plan:   Problem List Items Addressed This Visit       Respiratory   Viral URI with cough - Primary   With findings of bronchitis /mild wheezing  Reassuring exam  Neg covid test at home and no fever  No stridor Pt req test for rsv in light of children in family   Disc symptomatic care - see instructions on AVS Prednisone  30 mg taper  Albuterol  mdi prn  Expectorant and delsym prn   Update if not starting to improve in a week or if worsening  Call back and Er precautions noted in detail today          Relevant Orders   RSV screen (nasopharyngeal)not at [facility]      [1]  Social History Tobacco Use   Smoking status: Never   Smokeless tobacco: Never  Vaping Use   Vaping status: Never Used  Substance Use Topics   Alcohol use: Yes    Alcohol/week: 4.0 standard drinks of alcohol    Types: 4 Standard drinks or equivalent per week    Comment: social   Drug use: No  [2] No Known Allergies [3]  Current Outpatient Medications on File Prior to Visit  Medication Sig Dispense Refill   Colchicine  0.6 MG CAPS Take 2 tablets by mouth at gout onset, repeat with 1 pill 2 hours later, then take 1 tablet twice daily until flare resolves. 60 capsule 0   Febuxostat  80 MG TABS TAKE ONE TABLET BY MOUTH EVERY DAY FOR GOUT PREVENTION 90 tablet 1    fluticasone  (FLONASE ) 50 MCG/ACT nasal spray SPRAY 2 SPRAYS INTO EACH NOSTRIL EVERY DAY 48 mL 2   loratadine (CLARITIN) 10 MG tablet Take 10 mg by mouth daily.     No current facility-administered medications on file prior to visit.   "

## 2023-12-25 NOTE — Patient Instructions (Signed)
 Drink fluids and rest  Guaifenesin and delsym for cough and congestion  Nasal saline for congestion as needed  Tylenol  for fever or pain or headache  Take prednisone  for bronchitis as directed  Use the albuterol  inhaler if needed  Please alert us  if symptoms worsen (if severe or short of breath please go to the ER)    RSV test today   We will reach out with results    Update if not starting to improve in a week or if worsening

## 2023-12-25 NOTE — Assessment & Plan Note (Signed)
 With findings of bronchitis /mild wheezing  Reassuring exam  Neg covid test at home and no fever  No stridor Pt req test for rsv in light of children in family   Disc symptomatic care - see instructions on AVS Prednisone  30 mg taper  Albuterol  mdi prn  Expectorant and delsym prn   Update if not starting to improve in a week or if worsening  Call back and Er precautions noted in detail today

## 2023-12-26 ENCOUNTER — Ambulatory Visit: Payer: Self-pay | Admitting: Family Medicine

## 2023-12-26 LAB — RSV SCREEN (NASOPHARYNGEAL) NOT AT ARMC
MICRO NUMBER:: 17392060
RESULT:: NOT DETECTED
SPECIMEN QUALITY:: ADEQUATE

## 2023-12-31 ENCOUNTER — Encounter: Payer: Self-pay | Admitting: Family Medicine

## 2024-01-30 ENCOUNTER — Other Ambulatory Visit: Payer: Self-pay | Admitting: Primary Care

## 2024-01-30 DIAGNOSIS — M1A9XX0 Chronic gout, unspecified, without tophus (tophi): Secondary | ICD-10-CM

## 2024-02-01 ENCOUNTER — Other Ambulatory Visit (HOSPITAL_COMMUNITY): Payer: Self-pay

## 2024-02-01 ENCOUNTER — Telehealth: Payer: Self-pay

## 2024-02-01 NOTE — Telephone Encounter (Signed)
 Clinical questions have been answered and PA submitted. PA currently Pending. Please be advised that most companies allow up to 30 days to make a decision. We will advise when a determination has been made, or follow up in 1 week.   Please reach out to our team, Rx Prior Auth Pool, if you haven't heard back in a week.

## 2024-02-01 NOTE — Telephone Encounter (Signed)
 Pharmacy Patient Advocate Encounter   Received notification from [facility] that prior authorization for Febuxostat  80 mg tabs is required/requested.   Insurance verification completed.   The patient is insured through [facility].   Per test claim: PA required; PA started via CoverMyMeds. KEY [id number] . Waiting for clinical questions to populate.

## 2024-02-04 ENCOUNTER — Other Ambulatory Visit (HOSPITAL_COMMUNITY): Payer: Self-pay

## 2024-10-09 ENCOUNTER — Encounter: Admitting: Primary Care
